# Patient Record
Sex: Male | Born: 1935 | Race: White | Hispanic: No | Smoking: Never smoker
Health system: Southern US, Community
[De-identification: ages and names within clinical notes are randomized; demographics above are authoritative.]

## PROBLEM LIST (undated history)

## (undated) DIAGNOSIS — R569 Unspecified convulsions: Secondary | ICD-10-CM

## (undated) DIAGNOSIS — N1831 Chronic kidney disease, stage 3a: Secondary | ICD-10-CM

## (undated) DIAGNOSIS — N209 Urinary calculus, unspecified: Secondary | ICD-10-CM

## (undated) DIAGNOSIS — M6530 Trigger finger, unspecified finger: Secondary | ICD-10-CM

## (undated) DIAGNOSIS — F0391 Unspecified dementia with behavioral disturbance: Secondary | ICD-10-CM

## (undated) DIAGNOSIS — E119 Type 2 diabetes mellitus without complications: Secondary | ICD-10-CM

## (undated) DIAGNOSIS — N529 Male erectile dysfunction, unspecified: Secondary | ICD-10-CM

## (undated) DIAGNOSIS — D6869 Other thrombophilia: Secondary | ICD-10-CM

## (undated) DIAGNOSIS — R600 Localized edema: Secondary | ICD-10-CM

## (undated) DIAGNOSIS — G5600 Carpal tunnel syndrome, unspecified upper limb: Secondary | ICD-10-CM

## (undated) DIAGNOSIS — N189 Chronic kidney disease, unspecified: Secondary | ICD-10-CM

## (undated) DIAGNOSIS — G319 Degenerative disease of nervous system, unspecified: Secondary | ICD-10-CM

## (undated) DIAGNOSIS — Z87828 Personal history of other (healed) physical injury and trauma: Secondary | ICD-10-CM

## (undated) DIAGNOSIS — I482 Chronic atrial fibrillation, unspecified: Secondary | ICD-10-CM

## (undated) DIAGNOSIS — E785 Hyperlipidemia, unspecified: Secondary | ICD-10-CM

## (undated) DIAGNOSIS — J309 Allergic rhinitis, unspecified: Secondary | ICD-10-CM

## (undated) DIAGNOSIS — F03918 Unspecified dementia, unspecified severity, with other behavioral disturbance: Secondary | ICD-10-CM

## (undated) DIAGNOSIS — E7800 Pure hypercholesterolemia, unspecified: Secondary | ICD-10-CM

## (undated) DIAGNOSIS — I10 Essential (primary) hypertension: Secondary | ICD-10-CM

## (undated) HISTORY — DX: Personal history of other (healed) physical injury and trauma: Z87.828

## (undated) HISTORY — DX: Unspecified dementia, unspecified severity, with behavioral disturbance: F03.91

## (undated) HISTORY — DX: Male erectile dysfunction, unspecified: N52.9

## (undated) HISTORY — DX: Hyperlipidemia, unspecified: E78.5

## (undated) HISTORY — DX: Chronic kidney disease, stage 3a: N18.31

## (undated) HISTORY — DX: Unspecified convulsions: R56.9

## (undated) HISTORY — DX: Other thrombophilia: D68.69

## (undated) HISTORY — DX: Degenerative disease of nervous system, unspecified: G31.9

## (undated) HISTORY — DX: Trigger finger, unspecified finger: M65.30

## (undated) HISTORY — PX: CRANIOTOMY: SHX93

## (undated) HISTORY — DX: Type 2 diabetes mellitus without complications: E11.9

## (undated) HISTORY — DX: Chronic kidney disease, unspecified: N18.9

## (undated) HISTORY — DX: Chronic atrial fibrillation, unspecified: I48.20

## (undated) HISTORY — DX: Urinary calculus, unspecified: N20.9

## (undated) HISTORY — DX: Allergic rhinitis, unspecified: J30.9

## (undated) HISTORY — PX: EXTRACORPOREAL SHOCK WAVE LITHOTRIPSY: SHX1557

## (undated) HISTORY — DX: Unspecified dementia, unspecified severity, with other behavioral disturbance: F03.918

## (undated) HISTORY — DX: Localized edema: R60.0

## (undated) HISTORY — DX: Carpal tunnel syndrome, unspecified upper limb: G56.00

## (undated) HISTORY — PX: KNEE SURGERY: SHX244

---

## 2015-04-24 ENCOUNTER — Inpatient Hospital Stay
Admission: EM | Admit: 2015-04-24 | Discharge: 2015-04-26 | DRG: 087 | Disposition: A | Discharge status: Home / Self Care | Payer: Medicare Other | Attending: Surgical Critical Care | Admitting: Surgical Critical Care

## 2015-04-24 ENCOUNTER — Emergency Department: Payer: Medicare Other

## 2015-04-24 ENCOUNTER — Inpatient Hospital Stay: Payer: Medicare Other

## 2015-04-24 ENCOUNTER — Inpatient Hospital Stay: Payer: PRIVATE HEALTH INSURANCE | Admitting: Surgical Critical Care

## 2015-04-24 DIAGNOSIS — I609 Nontraumatic subarachnoid hemorrhage, unspecified: Secondary | ICD-10-CM | POA: Diagnosis present

## 2015-04-24 DIAGNOSIS — R402251 Coma scale, best verbal response, oriented, in the field [EMT or ambulance]: Secondary | ICD-10-CM | POA: Diagnosis present

## 2015-04-24 DIAGNOSIS — S069X1A Unspecified intracranial injury with loss of consciousness of 30 minutes or less, initial encounter: Secondary | ICD-10-CM

## 2015-04-24 DIAGNOSIS — Z7982 Long term (current) use of aspirin: Secondary | ICD-10-CM

## 2015-04-24 DIAGNOSIS — Y92254 Theater (live) as the place of occurrence of the external cause: Secondary | ICD-10-CM

## 2015-04-24 DIAGNOSIS — R561 Post traumatic seizures: Secondary | ICD-10-CM

## 2015-04-24 DIAGNOSIS — E7800 Pure hypercholesterolemia, unspecified: Secondary | ICD-10-CM | POA: Diagnosis present

## 2015-04-24 DIAGNOSIS — R402141 Coma scale, eyes open, spontaneous, in the field [EMT or ambulance]: Secondary | ICD-10-CM | POA: Diagnosis present

## 2015-04-24 DIAGNOSIS — R55 Syncope and collapse: Secondary | ICD-10-CM

## 2015-04-24 DIAGNOSIS — S065X1A Traumatic subdural hemorrhage with loss of consciousness of 30 minutes or less, initial encounter: Secondary | ICD-10-CM

## 2015-04-24 DIAGNOSIS — I6200 Nontraumatic subdural hemorrhage, unspecified: Secondary | ICD-10-CM

## 2015-04-24 DIAGNOSIS — S066X1A Traumatic subarachnoid hemorrhage with loss of consciousness of 30 minutes or less, initial encounter: Principal | ICD-10-CM

## 2015-04-24 DIAGNOSIS — W19XXXA Unspecified fall, initial encounter: Secondary | ICD-10-CM

## 2015-04-24 DIAGNOSIS — R402361 Coma scale, best motor response, obeys commands, in the field [EMT or ambulance]: Secondary | ICD-10-CM | POA: Diagnosis present

## 2015-04-24 DIAGNOSIS — S065XAA Traumatic subdural hemorrhage with loss of consciousness status unknown, initial encounter: Secondary | ICD-10-CM | POA: Diagnosis present

## 2015-04-24 DIAGNOSIS — I10 Essential (primary) hypertension: Secondary | ICD-10-CM | POA: Diagnosis present

## 2015-04-24 DIAGNOSIS — I4510 Unspecified right bundle-branch block: Secondary | ICD-10-CM

## 2015-04-24 DIAGNOSIS — S0990XA Unspecified injury of head, initial encounter: Secondary | ICD-10-CM

## 2015-04-24 DIAGNOSIS — S0219XA Other fracture of base of skull, initial encounter for closed fracture: Secondary | ICD-10-CM

## 2015-04-24 HISTORY — DX: Essential (primary) hypertension: I10

## 2015-04-24 HISTORY — DX: Pure hypercholesterolemia, unspecified: E78.00

## 2015-04-24 LAB — URINALYSIS, REFLEX TO MICROSCOPIC EXAM IF INDICATED
Bilirubin, UA: NEGATIVE
Blood, UA: NEGATIVE
Glucose, UA: NEGATIVE
Ketones UA: NEGATIVE
Leukocyte Esterase, UA: NEGATIVE
Nitrite, UA: NEGATIVE
Protein, UR: NEGATIVE
Specific Gravity UA: 1.015 (ref 1.001–1.035)
Urine pH: 7 (ref 5.0–8.0)
Urobilinogen, UA: NORMAL mg/dL

## 2015-04-24 LAB — COMPREHENSIVE METABOLIC PANEL
ALT: 31 U/L (ref 0–55)
AST (SGOT): 30 U/L (ref 5–34)
Albumin/Globulin Ratio: 1.2 (ref 0.9–2.2)
Albumin: 3.7 g/dL (ref 3.5–5.0)
Alkaline Phosphatase: 53 U/L (ref 38–106)
BUN: 16 mg/dL (ref 9.0–28.0)
Bilirubin, Total: 0.6 mg/dL (ref 0.2–1.2)
CO2: 25 mEq/L (ref 22–29)
Calcium: 8.8 mg/dL (ref 7.9–10.2)
Chloride: 104 mEq/L (ref 100–111)
Creatinine: 1 mg/dL (ref 0.7–1.3)
Globulin: 3 g/dL (ref 2.0–3.6)
Glucose: 157 mg/dL — ABNORMAL HIGH (ref 70–100)
Potassium: 3.9 mEq/L (ref 3.5–5.1)
Protein, Total: 6.7 g/dL (ref 6.0–8.3)
Sodium: 140 mEq/L (ref 136–145)

## 2015-04-24 LAB — CBC AND DIFFERENTIAL
Basophils Absolute Automated: 0.02 10*3/uL (ref 0.00–0.20)
Basophils Automated: 0 %
Eosinophils Absolute Automated: 0.03 10*3/uL (ref 0.00–0.70)
Eosinophils Automated: 0 %
Hematocrit: 41.4 % — ABNORMAL LOW (ref 42.0–52.0)
Hgb: 13.9 g/dL (ref 13.0–17.0)
Immature Granulocytes Absolute: 0.05 10*3/uL
Immature Granulocytes: 0 %
Lymphocytes Absolute Automated: 1.49 10*3/uL (ref 0.50–4.40)
Lymphocytes Automated: 12 %
MCH: 31 pg (ref 28.0–32.0)
MCHC: 33.6 g/dL (ref 32.0–36.0)
MCV: 92.2 fL (ref 80.0–100.0)
MPV: 10.8 fL (ref 9.4–12.3)
Monocytes Absolute Automated: 0.54 10*3/uL (ref 0.00–1.20)
Monocytes: 4 %
Neutrophils Absolute: 10.61 10*3/uL — ABNORMAL HIGH (ref 1.80–8.10)
Neutrophils: 83 %
Nucleated RBC: 0 /100 WBC (ref 0–1)
Platelets: 230 10*3/uL (ref 140–400)
RBC: 4.49 10*6/uL — ABNORMAL LOW (ref 4.70–6.00)
RDW: 13 % (ref 12–15)
WBC: 12.74 10*3/uL — ABNORMAL HIGH (ref 3.50–10.80)

## 2015-04-24 LAB — ECG 12-LEAD
Atrial Rate: 77 {beats}/min
P Axis: 30 degrees
P-R Interval: 182 ms
Q-T Interval: 436 ms
QRS Duration: 128 ms
QTC Calculation (Bezet): 493 ms
R Axis: -14 degrees
T Axis: -8 degrees
Ventricular Rate: 77 {beats}/min

## 2015-04-24 LAB — PLATELET FUNCTION TEST
Collagen ADP Closure Time: 93 (ref 46–105)
Collagen EPI Closure Time: 119 (ref 70–148)

## 2015-04-24 LAB — GFR: EGFR: >60

## 2015-04-24 LAB — I-STAT TROPONIN: i-STAT Troponin: 0.01 ng/mL (ref 0.00–0.09)

## 2015-04-24 MED ORDER — SODIUM CHLORIDE 0.9 % IV BOLUS
1000.0000 mL | Freq: Once | INTRAVENOUS | Status: AC
Start: 2015-04-24 — End: 2015-04-24
  Administered 2015-04-24: 1000 mL via INTRAVENOUS

## 2015-04-24 MED ORDER — SODIUM PHOSPHATE 3 MMOLE/ML IV SOLN
15.0000 mmol | INTRAVENOUS | Status: DC | PRN
Start: 2015-04-24 — End: 2015-04-26

## 2015-04-24 MED ORDER — SODIUM CHLORIDE 0.9 % IV SOLN
INTRAVENOUS | Status: DC
Start: 2015-04-24 — End: 2015-04-25

## 2015-04-24 MED ORDER — LABETALOL HCL 5 MG/ML IV SOLN
10.0000 mg | INTRAVENOUS | Status: DC | PRN
Start: 2015-04-24 — End: 2015-04-26

## 2015-04-24 MED ORDER — MAGNESIUM SULFATE IN D5W 10-5 MG/ML-% IV SOLN
1.0000 g | INTRAVENOUS | Status: DC | PRN
Start: 2015-04-24 — End: 2015-04-26

## 2015-04-24 MED ORDER — CETIRIZINE HCL 10 MG PO TABS
10.0000 mg | ORAL_TABLET | Freq: Every day | ORAL | Status: DC
Start: 2015-04-24 — End: 2015-04-26
  Filled 2015-04-24 (×4): qty 1

## 2015-04-24 MED ORDER — FOSPHENYTOIN SODIUM 100 MG PE/2ML IJ SOLN
100.0000 mg | Freq: Three times a day (TID) | INTRAMUSCULAR | Status: DC
Start: 2015-04-25 — End: 2015-04-26
  Administered 2015-04-25 – 2015-04-26 (×2): 100 mg via INTRAVENOUS
  Filled 2015-04-24 (×2): qty 2

## 2015-04-24 MED ORDER — AMLODIPINE BESYLATE 5 MG PO TABS
5.0000 mg | ORAL_TABLET | Freq: Every day | ORAL | Status: DC
Start: 2015-04-24 — End: 2015-04-26
  Administered 2015-04-24 – 2015-04-26 (×3): 5 mg via ORAL
  Filled 2015-04-24 (×3): qty 1

## 2015-04-24 MED ORDER — ONDANSETRON HCL 4 MG/2ML IJ SOLN
4.0000 mg | Freq: Three times a day (TID) | INTRAMUSCULAR | Status: DC | PRN
Start: 2015-04-24 — End: 2015-04-26

## 2015-04-24 MED ORDER — LOSARTAN POTASSIUM 25 MG PO TABS
50.0000 mg | ORAL_TABLET | Freq: Every day | ORAL | Status: DC
Start: 2015-04-24 — End: 2015-04-26
  Administered 2015-04-24 – 2015-04-26 (×3): 50 mg via ORAL
  Filled 2015-04-24 (×3): qty 2

## 2015-04-24 MED ORDER — LACTATED RINGERS IV SOLN
INTRAVENOUS | Status: DC
Start: 2015-04-24 — End: 2015-04-24

## 2015-04-24 MED ORDER — SODIUM CHLORIDE 0.9 % IV SOLN
1.0000 g | INTRAVENOUS | Status: DC | PRN
Start: 2015-04-24 — End: 2015-04-26

## 2015-04-24 MED ORDER — SODIUM PHOSPHATE 3 MMOLE/ML IV SOLN
35.0000 mmol | INTRAVENOUS | Status: DC | PRN
Start: 2015-04-24 — End: 2015-04-26

## 2015-04-24 MED ORDER — POTASSIUM CHLORIDE 10 MEQ/100ML IV SOLN
10.0000 meq | INTRAVENOUS | Status: DC | PRN
Start: 2015-04-24 — End: 2015-04-26

## 2015-04-24 MED ORDER — ATORVASTATIN CALCIUM 20 MG PO TABS
40.0000 mg | ORAL_TABLET | Freq: Every day | ORAL | Status: DC
Start: 2015-04-24 — End: 2015-04-26
  Administered 2015-04-24 – 2015-04-26 (×3): 40 mg via ORAL
  Filled 2015-04-24 (×3): qty 2

## 2015-04-24 MED ORDER — SODIUM CHLORIDE 0.9 % IV SOLN
18.0000 mg/kg | Freq: Once | INTRAVENOUS | Status: AC
Start: 2015-04-24 — End: 2015-04-24
  Administered 2015-04-24: 1700 mg via INTRAVENOUS
  Filled 2015-04-24 (×2): qty 34

## 2015-04-24 MED ORDER — SODIUM PHOSPHATE 3 MMOLE/ML IV SOLN
25.0000 mmol | INTRAVENOUS | Status: DC | PRN
Start: 2015-04-24 — End: 2015-04-26

## 2015-04-24 NOTE — Progress Notes (Signed)
Received Carlos [name] from ED. Patient is a 81 y.o. male on ASA 81mg  po qd last taken 11/21 pm who presents to the hospital on 04/24/2015 with presents s/p fall unclear head injury or LOC. CT- L temporal contusions, acute R SAH temporal, bilateral SDH R>L, small EDH, R temporal fracture. Patient placed in Aspen collar and taken to CT on monitor for Cervical CT. Patient returned to bedside. TICU resident to bedside to update patient and wife.

## 2015-04-24 NOTE — ED Provider Notes (Addendum)
Physician/Midlevel provider first contact with patient: 04/24/15 1322         EMERGENCY DEPARTMENT HISTORY AND PHYSICAL EXAM    Date Time: 04/24/2015 1:22 PM   Patient Name: Carlos [name]  Attending MD: Rachell Cipro  Time of Initial Evaluation:: 1:22 PM       Diagnosis and Treatment Plan     Disposition:  ED Disposition     Admit Admitting Physician: NG, Nehemiah Massed [[id number]]  Diagnosis: Traumatic subarachnoid bleed with LOC of 30 minutes or less [741187]  Estimated Length of Stay: > or = to 2 midnights  Tentative Discharge Plan?: Home or Self Care [1]  Patient Class: Inpatient [101]  I certify that inpatient services are medically necessary for this patient. Please see H&P and MD progress notes for additional information about the patient'[name] course of treatment. For Medicare patients, services provided in accordance with 412.3 and expected LOS to be greater than 2 midnights for Medicare patients.: Yes             Diagnosis:  1. Traumatic subarachnoid bleed with LOC of 30 minutes or less, initial encounter    2. Syncope, unspecified syncope type    3. Closed head injury, initial encounter    4. Subdural hemorrhage         New Discharge Prescriptions:  New Prescriptions    No medications on file        History of Presenting Illness     Chief Complaint:   Chief Complaint   Patient presents with   . Syncope   . Dizziness       Carlos [name] is a 81 y.o. male BIBA who presents with mild frontal headache and head injury [name]/p syncope pta. Per wife pt was walking/running on a cement bridge connecting a movie theater and the parking lot when he decided to slowly trot across the bridge as a joke. Wife states "3/4 way down the bridge he suddenly crumpled" and had a syncopal episode, falling and hitting his head. Wife states pt was not "with it" and looked rigid with spontaneous LUE shaking and pt was "out of it" for 3-4 minutes afterwards. Wife called for help and 2 men came and assisted her in getting pt up and calling 911.  Pt states he has minimal frontal headache behind his eyes. No other recent fall or head injury. Pt and wife drove up yesterday from [place], [place] and pt states his calves do not feel painful but feel like they are cramping.     This history was obtained from the(a) patient, patient'[name] wife.     Past Medical History     Past Medical History   Diagnosis Date   . Hypertension    . High cholesterol        Past Surgical History     Past Surgical History   Procedure Laterality Date   . Knee surgery         Family History     History reviewed. No pertinent family history.    Social History     Social History     Social History   . Marital Status: Single     Spouse Name: N/A   . Number of Children: N/A   . Years of Education: N/A     Social History Main Topics   . Smoking status: Never Smoker    . Smokeless tobacco: Not on file   . Alcohol Use: Not on file   . Drug Use:  Not on file   . Sexual Activity: Not on file     Other Topics Concern   . Not on file     Social History Narrative   . No narrative on file       Allergies     No Known Allergies    Medications     No current facility-administered medications for this encounter.    Current outpatient prescriptions:   .  amLODIPine (NORVASC) 10 MG tablet, Take 10 mg by mouth daily., Disp: , Rfl:   .  aspirin EC 81 MG EC tablet, Take 81 mg by mouth daily., Disp: , Rfl:   .  atorvastatin (LIPITOR) 40 MG tablet, Take 40 mg by mouth daily., Disp: , Rfl:   .  loratadine (CLARITIN) 10 MG tablet, Take 10 mg by mouth daily., Disp: , Rfl:   .  losartan (COZAAR) 100 MG tablet, Take 100 mg by mouth daily., Disp: , Rfl:     Review of Systems     Positive: headache behind eyes. No numbness or weakness    All Other Systems Reviewed and Negative: Yes    Physical Exam     BP 153/65 mmHg  Pulse 82  Temp(Src) 96.9 F (36.1 C) (Oral)  Resp 14  Ht 6\' 1"  (1.854 m)  Wt 95.255 kg  BMI 27.71 kg/m2  SpO2 98%    Constitutional: Vital signs reviewed. Well appearing and NAD  Head:  Normocephalic, atraumatic. (no apparent abrasion or STS of the scalp)  Eyes: No conjunctival injection. No discharge. EOMI, PERRL  ENT: Mucous membranes moist. TMs negative for hemotympanum.   Neck: Normal range of motion. Trachea midline. No midline tenderness, FROM.   Respiratory/Chest: Clear to auscultation. No respiratory distress.   Cardiovascular: Regular rate and rhythm. No gallops, rubs, or murmur. No JVD or bruits.  Abdomen: Soft and non-tender. No guarding. No masses or hepatosplenomegaly.  UpperExtremity:from, no c/c/e  LowerExtremity:  Neurological: No focal motor deficits by observation. Speech normal. Memory normal. Alert, oriented x3. 5/5 strength left upper and bilateral lower extremities, 4/5 strength L upper extremity d/t ongoing carpal tunnel, sensation intact, motor strength intact, finger to nose intact  Skin: Warm and dry. No rash.  Psychiatric: Normal affect. Normal concentration.    Diagnostic Study Results     Labs -     Results     Procedure Component Value Units Date/Time    UA, Reflex to Microscopic (pts  3 + yrs) [[id number]] Collected:  04/24/15 1459    Specimen Information:  Urine Updated:  04/24/15 1533     Urine Type Clean Catch      Color, UA Yellow      Clarity, UA Clear      Specific Gravity UA 1.015      Urine pH 7.0      Leukocyte Esterase, UA Negative      Nitrite, UA Negative      Protein, UR Negative      Glucose, UA Negative      Ketones UA Negative      Urobilinogen, UA Normal mg/dL      Bilirubin, UA Negative      Blood, UA Negative     GFR [[id number]] Collected:  04/24/15 1459     EGFR >60.0 Updated:  04/24/15 1533    Comprehensive metabolic panel [[id number]]  (Abnormal) Collected:  04/24/15 1459    Specimen Information:  Blood Updated:  04/24/15 1533     Glucose 157 (H) mg/dL  BUN 16.0 mg/dL      Creatinine 1.0 mg/dL      Sodium 161 mEq/L      Potassium 3.9 mEq/L      Chloride 104 mEq/L      CO2 25 mEq/L      Calcium 8.8 mg/dL      Protein, Total 6.7 g/dL      Albumin  3.7 g/dL      AST (SGOT) 30 U/L      ALT 31 U/L      Alkaline Phosphatase 53 U/L      Bilirubin, Total 0.6 mg/dL      Globulin 3.0 g/dL      Albumin/Globulin Ratio 1.2     CBC with differential [[id number]]  (Abnormal) Collected:  04/24/15 1459    Specimen Information:  Blood from Blood Updated:  04/24/15 1519     WBC 12.74 (H) x10 3/uL      Hgb 13.9 g/dL      Hematocrit 81.1 (L) %      Platelets 230 x10 3/uL      RBC 4.49 (L) x10 6/uL      MCV 92.2 fL      MCH 31.0 pg      MCHC 33.6 g/dL      RDW 13 %      MPV 10.8 fL      Neutrophils 83 %      Lymphocytes Automated 12 %      Monocytes 4 %      Eosinophils Automated 0 %      Basophils Automated 0 %      Immature Granulocyte 0 %      Nucleated RBC 0 /100 WBC      Neutrophils Absolute 10.61 (H) x10 3/uL      Abs Lymph Automated 1.49 x10 3/uL      Abs Mono Automated 0.54 x10 3/uL      Abs Eos Automated 0.03 x10 3/uL      Absolute Baso Automated 0.02 x10 3/uL      Absolute Immature Granulocyte 0.05 x10 3/uL     i-Stat Troponin [[id number]] Collected:  04/24/15 1456     i-STAT Troponin 0.01 ng/mL Updated:  04/24/15 1512          Radiologic Studies -   Radiology Results (24 Hour)     Procedure Component Value Units Date/Time    XR Chest AP Portable [[id number]] Resulted:  04/24/15 1557    Order Status:  Sent Updated:  04/24/15 1600    CT Head WO Contrast [[id number]] Collected:  04/24/15 1458    Order Status:  Completed Updated:  04/24/15 1514    Narrative:      HISTORY: Syncope with head trauma. Posttraumatic headaches.         COMPARISON: None available.    TECHNIQUE: A non- contrast CT of the head was performed.      This CT study was performed using radiation dose reduction techniques  including  the following: automated exposure control or adjustment of  the mA and/or kV according to patient size and the use of iterative  reconstruction technique.    FINDINGS: There is a fracture of the squamous portion of the right  temporal bone. There is pneumocephalus lateral to the  left temporal lobe  suggesting there is an occult left temporal bone fracture as well.     There are areas of increased attenuation felt to represent hemorrhagic  contusions in the anterior and lateral  aspects of the left temporal  lobe.    There is acute subarachnoid hemorrhage in several the sulci of the right  temporal lobe.    There are bilateral extra-axial hyperattenuating collections that are  most likely bilateral acute subdural hematomas, right greater than left.  Small amounts of acute epidural hemorrhage related to the bilateral  temporal bone fractures could also be present. The right-sided subdural  hematoma measures up to 6 mm in thickness and is associated with the 3  mm of right to left midline shift.    There is no evidence of hydrocephalus.    The paranasal sinuses show polypoid soft tissue filling much of the left  maxillary sinus that could reflect a mucous retention cyst, antral  polyp, inverted papilloma or other polypoid pathology of uncertain  histology. There appears to been previous bilateral sinus surgery. There  is extensive lobulated soft tissue throughout the remaining ethmoid air  cells bilaterally. Lobular soft tissue opacifies the left frontal recess  and the left frontal sinus.           Impression:        1. There is a fracture of the squamous portion of the right temporal  bone. There is pneumocephalus lateral to the left temporal lobe  suggesting there is an occult left temporal bone fracture as well.     2. There are hemorrhagic contusions in the anterior and lateral aspects  of the left temporal lobe.    3. There is acute subarachnoid hemorrhage in several the sulci of the  right temporal lobe.    4. There are bilateral extra-axial hyperattenuating collections that are  most likely bilateral acute subdural hematomas, right greater than left.  Small amounts of acute epidural hemorrhage related to the bilateral  temporal bone fractures could also be present. The right-sided  subdural  hematoma measures up to 6 mm in thickness and is associated with the 3  mm of right to left midline shift.    5. There has been previous bilateral sinus surgery. Extensive sinonasal  polyposis as discussed above. In particular within the left maxillary  sinus  there is polypoid soft tissue filling much of the left maxillary  sinus that could reflect a mucous retention cyst, antral polyp, inverted  papilloma or other polypoid pathology of uncertain histology.     6. These critical  results were discussed with Dr. Cheral Almas   by telephone  at 2:56 PM on April 24, 2015.     Theodoro Doing, MD   04/24/2015 3:10 PM        .    Clinical Course in the Emergency Department       3:00 pm - discussed CT with Dr. Derry Skill who notes likely traumatic SAH, small bilateral SDH. Page placed to neurosurg   3:05 pm - discussed with Carly senior trauma resident.   3:11 pm - discussed with neurosurgery   1520: discussed the case and CT results with Mr. Bothwell, who awaits eval. Per Trauma Team and NSURG.  He only has mild HA at this time and no neuro complaints.  Further questioning and his wife acknowledges that it appeared that he "did hit his head pretty hard when he went to the ground"  Rachell Cipro, MD    Critical Care Time (not including procedures): 30-74 minutes.   Due to the high risk of critical illness or multi-organ failure at initial presentation and/or during ED course.    System([name]) at risk for compromise:  CNS  Critical Diagnosis:   1. Traumatic subarachnoid bleed with LOC of 30 minutes or less, initial encounter    2. Syncope, unspecified syncope type    3. Closed head injury, initial encounter    4. Subdural hemorrhage    5. SAH (subarachnoid hemorrhage)    6. SDH (subdural hematoma)    7. Closed fracture of temporal bone, initial encounter         The patient was Hypotensive:   No     The patient was Hypoxic:   No     This does not including time spent performing other reported procedures or  services.   Critical care time involved full attention to the patient'[name] condition and included:   Review of nursing notes and/or old charts - Yes  Documentation time - Yes  Care, transfer of care, and discharge plans - Yes  Obtaining necessary history from family, EMS, nursing home staff and/or treating physicians - Yes  Review of medications, allergies, and vital signs - Yes   Consultant collaboration on findings and treatment options - Yes  Ordering, interpreting, and reviewing diagnostic studies/tab tests - Yes         Medical Decision Making     I reviewed the vital signs, nursing notes, past medical history, past surgical history, family history and social history.    Vital Signs -   Patient Vitals for the past 12 hrs:   BP Temp Pulse Resp   04/24/15 1458 153/65 mmHg - 82 14   04/24/15 1400 - - 72 18   04/24/15 1256 166/72 mmHg - 82 18   04/24/15 1202 155/72 mmHg - 84 18   04/24/15 1126 160/77 mmHg (!) 96.9 F (36.1 C) 86 17       Pulse Oximetry Analysis - nl without need for supplemental oxygen    EKG as interpreted by me: NSR 77, borderline prolonged QRS with RBBB pattern. T-wave inversion inferior leads, otherwise negative EKG.    Laboratory results reviewed by EDP: yes in real time      Impression/ Differential Diagnosis (not completely inclusive): 81 y.o. WM with Syncopal event and fall to ground r/o CVA, traumatic ICH, syncope d/t arrythmia, etc  Plan: Telemonitoring, CT head, labs and analgesia.  Trauma admission to TICU for close monitoring    _______________________________    Attestations:    I was acting as a scribe for Rachell Cipro, MD on Carlos [name]   Treatment Team: Scribe: Al-Amin, Yousif .    I am the first provider for this patient and I personally performed the services documented. Treatment Team: Scribe: Al-Amin, Yousif  is scribing for me on Bucher,Carlos [name] . This note accurately reflects work and decisions made by me, Rachell Cipro, M.D.           _______________________________      Vara Guardian, MD  04/29/15 1478    Vara Guardian, MD  04/29/15 [id number]

## 2015-04-24 NOTE — H&P (Addendum)
Attending Attestation:     I have personally seen and examined this patient and I have reviewed Carlos notes, assessments, and/ or procedures by Carlos resident staff and physician extenders, I concur with his/her documentation of Carlos Carlos [name].    I have personally edited Carlos note below as needed    Main Issues:  72M visiting family in [name] from [name] who fell on concrete bridge outside of [name], questionable immediate Sz at scene  GCS 15 currently in ER  +CT multicompartment ICH with B temporal fractures  On ASA 81, pending PFA and INR- treat as needed  ICU admission for q1hr, discussed with pt and significant other who he says has legal POA (also 2 sons)- pt is Full Code  AEDs for multi-compartment/ temporal bleed  Hold Lovenox  Therapy  Questionable syncope, telemetry, RBBB prob chronic- will check with PMD office    Inpatient Admission is needed due to: multi-compartment ICH and Sz requiring prolonged monitoring in ICU    Exclusive of time for Resident/ Student education and for separate procedures-  Total Critical Care Time spent no minutes.    Carlos Kitchens, MD, FACS    Carl[place] [name], M.D., F.A.C.S. Carlos Carlos [name] [phone])  Attending Surgeon, [facility]  General and Acute Care Surgery, Trauma and Surgical Critical Care  Board Certified in Surgery and in Surgical Critical Care  and Subspecialty Certified in Neurocritical Care                 TRAUMA HISTORY AND PHYSICAL     Date Time: 04/24/2015 3:31 PM  Patient Name: Carlos Carlos [name]  Attending Physician: Carlos Harm, MD  Primary Care Physician: Carlos See, MD    Date of Admission:   04/24/2015 11:56 AM    Trauma Level:   Consult    Assessment/Plan:   Carlos patient has Carlos following active problems:  Patient Active Problem List   Diagnosis   . SDH (subdural hematoma)   . SAH (subarachnoid hemorrhage)   . Temporal bone fracture       Plan by systems:  Neuro: q1h neuro checks, NSGY following, AEDs, pain control, ENT  consult in AM  Pulm: pulm toilet  CV: will contact PCP for old EKG to compare RBBB, holding home ASA, half home HTN meds  Endo: NAI  GI: NPO overnight, IVF  Heme/ID: f/u PFAs  Renal: monitor UOP  Psych: supportive  Wounds: none    Patient will be admitted to: ICU  Massive transfusion protocol:  No      Consulting Services:   Neurosurgery - Carlos Carlos [name]    Patient Complaint:   Carlos Carlos [name] is a 81 y.o. male who presents to Carlos hospital after Fall: Yes.  Carlos patient had a fall earlier today while walking quickly over a bridge. His girlfriend reports that he did not trip or stagger, but suddenly fell down, hitting his Carlos [name] and had approx 2 minutes of LOC. He was initially confused when he woke up, but has since normalized. He has no memory of Carlos event. He has had prior episodes of dizziness while playing tennis, but he relates that to his blood pressure meds, which he and his PCP have been adjusting accordingly. He states that he is concerned about this event because he has always been aware of Carlos dizziness in time to prevent falls. He has a known "electrical pathway abnormality" from prior EKGs (RBBB on in-house EKG), but this has been asymptomatic. He takes  ASA 81, but no anticoagulants. He reports headache and decreased hearing.     PCP ([place]): [place] Carlos [name] [phone]  POA (significant other): Carlos Carlos [name] - patient reports she has POA paperwork and he wants her to make decisions, not legally married.    Scene Report:      Scene GCS: Eye opening 4 - spontaneous, Verbal Response 5 - alert/oriented, Motor Response 6 - obeys commands. Total GCS: 15   Transport: ALS, Time of Injury PTA   Transferred from: From Scene   LOC: Yes   Intubated: No   Hemodynamically: Stable   C-spine immobilized pre-hospital: No          Carlos medications, past medical/surgical history, family history, allergies & full review of systems were:  Reviewed    Allergies:   No Known Allergies    Medication:     (Not in a hospital  admission)    Past Medical History:     Past Medical History   Diagnosis Date   . Hypertension    . High cholesterol        Past Surgical History:     Past Surgical History   Procedure Laterality Date   . Knee surgery         Family History:   History reviewed. No pertinent family history.    Social History:     Social History     Social History   . Marital Status: Single     Spouse Name: N/A   . Number of Children: N/A   . Years of Education: N/A     Social History Main Topics   . Smoking status: Never Smoker    . Smokeless tobacco: Not on file   . Alcohol Use: Not on file   . Drug Use: Not on file   . Sexual Activity: Not on file     Other Topics Concern   . Not on file     Social History Narrative   . No narrative on file       Vaccination:   Tetanus up to date: Unknown    Review of Systems:   Review of Systems   Constitutional: Negative.    HENT: Positive for hearing loss.    Eyes: Negative.    Respiratory: Negative.    Cardiovascular: Negative.    Gastrointestinal: Negative.    Musculoskeletal: Negative.    Skin: Negative.    Neurological: Positive for sensory change and headaches.   Endo/Heme/Allergies: Negative.    Psychiatric/Behavioral: Negative.        Physical Exam:   Physical Exam   Constitutional: He is oriented to person, place, and time and well-developed, well-nourished, and in no distress.   HENT:   Carlos [name]: Normocephalic and atraumatic.   Eyes: EOM are normal. Pupils are equal, round, and reactive to light. No scleral icterus.   Neck: Normal range of motion. No tracheal deviation present.   C/T/L-spine non-tender, no step-offs   Cardiovascular: Normal rate, regular rhythm and intact distal pulses.    Pulmonary/Chest: Effort normal and breath sounds normal. No respiratory distress.   Abdominal: Soft. He exhibits no distension. There is no tenderness.   Musculoskeletal: Normal range of motion. He exhibits no edema.   Pelvis stable, non-tender   Neurological: He is alert and oriented to person, place,  and time. GCS score is 15.   Skin: Skin is warm and dry. He is not diaphoretic.   Psychiatric: Affect and judgment normal.   Vitals reviewed.  Filed Vitals:    04/24/15 1458   BP: 153/65   Pulse: 82   Temp:    Resp: 14   SpO2: 98%         Labs:     Results     Procedure Component Value Units Date/Time    CBC with differential [[id number]]  (Abnormal) Collected:  04/24/15 1459    Specimen Information:  Blood from Blood Updated:  04/24/15 1519     WBC 12.74 (H) x10 3/uL      Hgb 13.9 g/dL      Hematocrit 40.9 (L) %      Platelets 230 x10 3/uL      RBC 4.49 (L) x10 6/uL      MCV 92.2 fL      MCH 31.0 pg      MCHC 33.6 g/dL      RDW 13 %      MPV 10.8 fL      Neutrophils 83 %      Lymphocytes Automated 12 %      Monocytes 4 %      Eosinophils Automated 0 %      Basophils Automated 0 %      Immature Granulocyte 0 %      Nucleated RBC 0 /100 WBC      Neutrophils Absolute 10.61 (H) x10 3/uL      Abs Lymph Automated 1.49 x10 3/uL      Abs Mono Automated 0.54 x10 3/uL      Abs Eos Automated 0.03 x10 3/uL      Absolute Baso Automated 0.02 x10 3/uL      Absolute Immature Granulocyte 0.05 x10 3/uL     Comprehensive metabolic panel [[id number]] Collected:  04/24/15 1459    Specimen Information:  Blood Updated:  04/24/15 1513    UA, Reflex to Microscopic (pts  3 + yrs) [[id number]] Collected:  04/24/15 1459    Specimen Information:  Urine Updated:  04/24/15 1513    i-Stat Troponin [[id number]] Collected:  04/24/15 1456     i-STAT Troponin 0.01 Kie Calvin/mL Updated:  04/24/15 1512          Rads:   Radiological Procedure reviewed.      CT Carlos [name] WO CONTRAST    Ct Carlos [name] Wo Contrast    04/24/2015  1. There is a fracture of Carlos squamous portion of Carlos right temporal bone. There is pneumocephalus lateral to Carlos left temporal lobe suggesting there is an occult left temporal bone fracture as well. 2. There are hemorrhagic contusions in Carlos anterior and lateral aspects of Carlos left temporal lobe. 3. There is acute subarachnoid hemorrhage in several  Carlos sulci of Carlos right temporal lobe. 4. There are bilateral extra-axial hyperattenuating collections that are most likely bilateral acute subdural hematomas, right greater than left. Small amounts of acute epidural hemorrhage related to Carlos bilateral temporal bone fractures could also be present. Carlos right-sided subdural hematoma measures up to 6 mm in thickness and is associated with Carlos 3 mm of right to left midline shift. 5. There has been previous bilateral sinus surgery. Extensive sinonasal polyposis as discussed above. In particular within Carlos left maxillary sinus  there is polypoid soft tissue filling much of Carlos left maxillary sinus that could reflect a mucous retention cyst, antral polyp, inverted papilloma or other polypoid pathology of uncertain histology. 6. These critical  results were discussed with Dr. Cheral Almas   by telephone at 2:56 PM on April 24, 2015. Theodoro Doing,  MD 04/24/2015 3:10 PM       Carly A. Allred, MD  General Surgery  Pager [phone]      Attending Attestation:

## 2015-04-24 NOTE — Progress Notes (Signed)
Neurosurgery Progress Note    NS Examined Date Time: 04/24/2015 3:22 PM  Signed by: Lyanne Co  Time consult called: 3:25 PM  Date/Time: 04/24/2015 4:17 PM

## 2015-04-24 NOTE — Plan of Care (Signed)
Problem: Safety  Goal: Patient will be free from injury during hospitalization  Outcome: Progressing    Problem: Pain  Goal: Patient's pain/discomfort is manageable  Outcome: Progressing    Problem: Psychosocial and Spiritual Needs  Goal: Demonstrates ability to cope with hospitalization/illness  Outcome: Progressing    Problem: Moderate/High Fall Risk Score >5  Goal: Patient will remain free of falls  Outcome: Progressing    Problem: Potential for Compromised Skin Integrity  Goal: Skin integrity is maintained or improved  Assess and monitor skin integrity. Identify patients at risk for skin breakdown on admission and per policy. Collaborate with interdisciplinary team and initiate plans and interventions as needed.   Outcome: Progressing  Goal: Nutritional status is improving  Monitor and assess patient for malnutrition (ex- brittle hair, bruises, dry skin, pale skin and conjunctiva, muscle wasting, smooth red tongue, and disorientation). Collaborate with interdisciplinary team and initiate plan and interventions as ordered. Monitor patient's weight and dietary intake as ordered or per policy. Utilize nutrition screening tool and intervene per policy. Determine patient's food preferences and provide high-protein, high-caloric foods as appropriate.   Outcome: Progressing    Problem: Tissue integrity  Goal: Damaged tissue is healing and protected  Outcome: Progressing  Goal: Nutritional Status Improving  Outcome: Progressing    Problem: Urinary Incontinence  Goal: Perineal skin integrity is maintained or improved  Assess genitourinary system, perineal skin, labs (urinalysis), and history of incontinence to include past management, aggravating, and alleviating factors. Collaborate with interdisciplinary team and initiate plans and interventions as needed.   Outcome: Progressing

## 2015-04-24 NOTE — Consults (Signed)
NEUROSURGERY CONSULTATION    Date Time: 04/24/2015 4:17 PM  Patient Name: Carlos [name]  Requesting Physician: Vara Guardian, MD  Consulting Physician: Dr. Rosemarie Beath  Covered By: Neurosurgery PA/pager [phone]    Time of Exam: 3:25 PM     Reason for Consultation:   L temporal contusions, acute R SAH temporal, bilateral SDH R>L, small EDH, R temporal fracture    History:   Carlos [name] is a 81 y.o. male on ASA 81mg  po qd who presents to the hospital on 04/24/2015 with presents s/p fall unclear head injury or LOC. Patient reports he was walking to his car to grab his coat and then doesn't remember what happened next but was laying on the ground. Per wife, patient "crumped" down, did not clearly hit his head, and was unresponsive to her for 2-3 minutes although his eyes were open. Patient endorses mild frontal/retro-orbital headache. Patient denies vision change, dizziness, nausea, vomiting, weakness, numbness, paresthesias. Pt has pmhx of HTN and hypercholesterolemia. Of note, patient reports that he gets "dizzy spells" occasionally when playing tennis but says he did not feel at all dizzy today. Per patient, he has been on the same anti-HTN medications with same dose for "a long time".     Past Medical History:     Past Medical History   Diagnosis Date   . Hypertension    . High cholesterol      Past Surgical History:     Past Surgical History   Procedure Laterality Date   . Knee surgery         Family History:   History reviewed. No pertinent family history.    Social History:     Social History     Social History   . Marital Status: Single     Spouse Name: N/A   . Number of Children: N/A   . Years of Education: N/A     Social History Main Topics   . Smoking status: Never Smoker    . Smokeless tobacco: Not on file   . Alcohol Use: Not on file   . Drug Use: Not on file   . Sexual Activity: Not on file     Other Topics Concern   . Not on file     Social History Narrative   . No narrative on file        Allergies:   No Known Allergies    Medications:   See chart    Review of Systems:   A comprehensive review of systems was: see HPI. All other systems were reviewed and are negative    Physical Exam:     Filed Vitals:    04/24/15 1458   BP: 153/65   Pulse: 82   Temp:    Resp: 14   SpO2: 98%       Intake and Output Summary (Last 24 hours) at Date Time  No intake or output data in the 24 hours ending 04/24/15 1617    Physical Exam    General: Patient is well developed and well nourished and in no apparent distress   Cooperative with the examination.     Blood pressure stable, pulse regular, afebrile, and respiratory rate regular.     Mental Status/Psych:   Awake, alert, and oriented x 3 (self, location, month/year)  Recent and remote memory are intact  Attention span and concentration normal  No agitation, delirium, or evidence of delusions or hallucinations  Affect appropriate for mood.  Neuro:  Follows commands  Normocephalic atraumatic; without scalp abrasions, lacerations  No bony step-off or battle's sign  PERRL , EOMI BL. No nystagmus.   No evidence of visual field neglect  Face is symmetric, tongue midline  Speech clear/fluent and appropriate  No evidence of aphasia  (-) drift    Motor:   BUE 5/5 strength: deltoid, bicep, tricep, grip, interosseous   BLE 5/5 strength: IP, quadriceps, DF, EHL, PF  Muscle tone is normal. No atrophy.    Sensory: intact to light touch in bilateral upper and lower extremities    Reflexes:   Bicep and patellar DTR's 2+ and symmetric  (-) babinski bilaterally  (-) clonus bilaterally    GCS:15       Lungs CTA +BBS  CVS RRR no mgr  ABD NDNT    Labs Reviewed:     Lab Results   Component Value Date    WBC 12.74* 04/24/2015    HGB 13.9 04/24/2015    HCT 41.4* 04/24/2015    MCV 92.2 04/24/2015    PLT 230 04/24/2015     Lab Results   Component Value Date    NA 140 04/24/2015    K 3.9 04/24/2015    CL 104 04/24/2015    CO2 25 04/24/2015     No results found for: INR, PT    Rads:      Radiology Results (24 Hour)     Procedure Component Value Units Date/Time    XR Chest AP Portable [[id number]] Resulted:  04/24/15 1557    Order Status:  Sent Updated:  04/24/15 1600    CT Head WO Contrast [[id number]] Collected:  04/24/15 1458    Order Status:  Completed Updated:  04/24/15 1514    Narrative:      HISTORY: Syncope with head trauma. Posttraumatic headaches.         COMPARISON: None available.    TECHNIQUE: A non- contrast CT of the head was performed.      This CT study was performed using radiation dose reduction techniques  including  the following: automated exposure control or adjustment of  the mA and/or kV according to patient size and the use of iterative  reconstruction technique.    FINDINGS: There is a fracture of the squamous portion of the right  temporal bone. There is pneumocephalus lateral to the left temporal lobe  suggesting there is an occult left temporal bone fracture as well.     There are areas of increased attenuation felt to represent hemorrhagic  contusions in the anterior and lateral aspects of the left temporal  lobe.    There is acute subarachnoid hemorrhage in several the sulci of the right  temporal lobe.    There are bilateral extra-axial hyperattenuating collections that are  most likely bilateral acute subdural hematomas, right greater than left.  Small amounts of acute epidural hemorrhage related to the bilateral  temporal bone fractures could also be present. The right-sided subdural  hematoma measures up to 6 mm in thickness and is associated with the 3  mm of right to left midline shift.    There is no evidence of hydrocephalus.    The paranasal sinuses show polypoid soft tissue filling much of the left  maxillary sinus that could reflect a mucous retention cyst, antral  polyp, inverted papilloma or other polypoid pathology of uncertain  histology. There appears to been previous bilateral sinus surgery. There  is extensive lobulated soft tissue throughout the  remaining ethmoid  air  cells bilaterally. Lobular soft tissue opacifies the left frontal recess  and the left frontal sinus.           Impression:        1. There is a fracture of the squamous portion of the right temporal  bone. There is pneumocephalus lateral to the left temporal lobe  suggesting there is an occult left temporal bone fracture as well.     2. There are hemorrhagic contusions in the anterior and lateral aspects  of the left temporal lobe.    3. There is acute subarachnoid hemorrhage in several the sulci of the  right temporal lobe.    4. There are bilateral extra-axial hyperattenuating collections that are  most likely bilateral acute subdural hematomas, right greater than left.  Small amounts of acute epidural hemorrhage related to the bilateral  temporal bone fractures could also be present. The right-sided subdural  hematoma measures up to 6 mm in thickness and is associated with the 3  mm of right to left midline shift.    5. There has been previous bilateral sinus surgery. Extensive sinonasal  polyposis as discussed above. In particular within the left maxillary  sinus  there is polypoid soft tissue filling much of the left maxillary  sinus that could reflect a mucous retention cyst, antral polyp, inverted  papilloma or other polypoid pathology of uncertain histology.     6. These critical  results were discussed with Dr. Cheral Almas   by telephone  at 2:56 PM on April 24, 2015.     Theodoro Doing, MD   04/24/2015 3:10 PM            Assessment:   81 yo M on ASA 81mg  with pmhx HTN, hypercholesterolemia presents s/p fall with unknown head injury/LOC. Head CT revealed L temporal contusions, acute R SAH temporal, bilateral SDH R>L, small EDH, R temporal fracture, and temporal fracture. GCS 15, neuro intact.    Plan:   Admit to TICU  Medical management per [facility]  q1 neuro checks   Repeat head CT for neuro decline  SBP < 150  HOB > 30  No ASA/NSAIDs/blood thinners  Keppra   No acute neurosurgical  intervention indicated at this time    Plan discussed with Dr. Deloria Lair    Signed by: Lyanne Co PA-C  Date/Time: 04/24/2015 4:17 PM       I have reviewed the notes and examined the patient with and performed by PA Desko, I concur with her/his documentation of Carlos [name].    Dr. Marlaine Hind        Neuro:  Follows commands  Normocephalic atraumatic; without scalp abrasions, lacerations  No bony step-off or battle's sign  PERRL , EOMI BL. No nystagmus.   No evidence of visual field neglect  Face is symmetric, tongue midline  Speech clear/fluent and appropriate  No evidence of aphasia  (-) drift    Motor:   BUE 5/5 strength: deltoid, bicep, tricep, grip, interosseous   BLE 5/5 strength: IP, quadriceps, DF, EHL, PF  Muscle tone is normal. No atrophy.    Sensory: intact to light touch in bilateral upper and lower extremities    Reflexes:   Bicep and patellar DTR's 2+ and symmetric  (-) babinski bilaterally  (-) clonus bilaterally    GCS:15    A/SDH /EDH with temporal fracture (non displaced); temporal contusion    P/ 1. Neurochecks q 1 hrs 2. keppra 3. Rpt CTH for exam  decline    JFH

## 2015-04-25 ENCOUNTER — Inpatient Hospital Stay: Payer: Medicare Other

## 2015-04-25 DIAGNOSIS — I6200 Nontraumatic subdural hemorrhage, unspecified: Secondary | ICD-10-CM

## 2015-04-25 DIAGNOSIS — I609 Nontraumatic subarachnoid hemorrhage, unspecified: Secondary | ICD-10-CM

## 2015-04-25 DIAGNOSIS — S066X1A Traumatic subarachnoid hemorrhage with loss of consciousness of 30 minutes or less, initial encounter: Secondary | ICD-10-CM | POA: Insufficient documentation

## 2015-04-25 DIAGNOSIS — S06310A Contusion and laceration of right cerebrum without loss of consciousness, initial encounter: Secondary | ICD-10-CM

## 2015-04-25 NOTE — UM Notes (Addendum)
Admit to inpatient:  04/24/15 1617    Presents to the hospital on 04/24/2015 with presents s/p fall unclear head injury or LOC. Patient reports he was walking to his car to grab his coat and then doesn't remember what happened next but was laying on the ground. Per wife, patient "crumped" down, did not clearly hit his head, and was unresponsive to her for 2-3 minutes although his eyes were open. Patient endorses mild frontal/retro-orbital headache.    98.5, 93, 43, 156/84    CT- L temporal contusions, acute R SAH temporal, bilateral SDH R>L, small EDH, R temporal fracture.There is pneumocephalus lateral to the left temporal lobe suggesting there is an occult left temporal bone fracture R temporal fracture associated with the 3mm of right to left midline shift.    PMH: "electrical pathway abnormality" from prior EKGs (RBBB on in-house EKG), but this has been asymptomatic. He takes ASA 81, but no anticoagulants. He reports headache and decreased hearing.    ED Meds: NS Bolus x 1L    Admitted ICU  unit: VS q 1  hr, Telemetry and pulse oximetry: Cerebyx 1,700 mg iv x 1, Cerebyx 100 mg iv q 8 hr,  IVF @ 125cc/hr

## 2015-04-25 NOTE — Progress Notes (Signed)
ICU ACUTE CARE SURGERY / TRAUMA PROGRESS NOTE     Date/Time: 04/25/2015 6:36 AM  Patient Name: Carlos [name]  Primary Care Physician: Christa See, MD  Hospital Day: 1     Post-op Day:      Assessment/Plan:   This patient is critically ill and requiring ICU level of care due to Cerebral contusions.    The patient has the following active problems:  Patient Active Problem List   Diagnosis   . SDH (subdural hematoma)   . SAH (subarachnoid hemorrhage)   . Temporal bone fracture   . Traumatic subarachnoid bleed with LOC of 30 minutes or less       Plan by systems:  Neuro: GCS 15, mae x 4 , SAH , L temporal contusions, bilat sdh, small EDH, R temporal bone fx, non op per neurosurgery , cont q1 neurochecks , if needed start fioricet for pain control  Seizure Prophylaxis Dilantin   Pulm: RA, encourage IS deep breathing and cough, oob  CV: SR, BP stable, hx of htn , cont norvasc, lipitor, cozaar  Endo: nai  GI: NPO - start diet this am , d/c mivf   GI Prophylaxis:  No prophylaxis need, patient is on a diet   Heme/ID: afebrile, HD stable  DVT Prophylaxis: SCDs and frequent ambulation  Renal: voiding per urinal, cont to monitor   Foley: no  Neuromuscular: oob with assistance  Weight Bearing Right Left   Upper Extremity WBAT WBAT   Lower Extremity WBAT WBAT   PT/OT: no  Psych: supportive   Wounds: none  Dispo: eval when ambulatory for pt/ot need     No other injuries identified during my exam other than the ones noted     Neurosurgery - [place]    Interval History:   Carlos [name] is a 81 y.o. male who presents to the hospital after Fall: Yes.     Significant overnight events include none     Allergies:   No Known Allergies    Medications:     Current Facility-Administered Medications   Medication Dose Route Frequency   . amLODIPine  5 mg Oral Daily   . atorvastatin  40 mg Oral Daily   . cetirizine  10 mg Oral Daily   . fosphenytoin MAINTENANCE DOSE  100 mg PE Intravenous Q8H   . losartan  50 mg Oral Daily             calcium GLUConate IVPB 1g, labetalol, magnesium sulfate, ondansetron, potassium chloride, sodium phosphate IVPB, sodium phosphate IVPB, sodium phosphate IVPB        Labs:     Results     Procedure Component Value Units Date/Time    MRSA culture [[id number]] Collected:  04/24/15 1853    Specimen Information:  Body Fluid from Nares and Throat Updated:  04/24/15 2124    Platelet Function Test - [facility] ONLY [[id number]] Collected:  04/24/15 1624    Specimen Information:  Blood Updated:  04/24/15 1804     Collagen ADP Closure Time 93      Collagen EPI Closure Time 119     UA, Reflex to Microscopic (pts  3 + yrs) [[id number]] Collected:  04/24/15 1459    Specimen Information:  Urine Updated:  04/24/15 1533     Urine Type Clean Catch      Color, UA Yellow      Clarity, UA Clear      Specific Gravity UA 1.015      Urine pH 7.0  Leukocyte Esterase, UA Negative      Nitrite, UA Negative      Protein, UR Negative      Glucose, UA Negative      Ketones UA Negative      Urobilinogen, UA Normal mg/dL      Bilirubin, UA Negative      Blood, UA Negative     GFR [[id number]] Collected:  04/24/15 1459     EGFR >60.0 Updated:  04/24/15 1533    Comprehensive metabolic panel [[id number]]  (Abnormal) Collected:  04/24/15 1459    Specimen Information:  Blood Updated:  04/24/15 1533     Glucose 157 (H) mg/dL      BUN 18.8 mg/dL      Creatinine 1.0 mg/dL      Sodium 416 mEq/L      Potassium 3.9 mEq/L      Chloride 104 mEq/L      CO2 25 mEq/L      Calcium 8.8 mg/dL      Protein, Total 6.7 g/dL      Albumin 3.7 g/dL      AST (SGOT) 30 U/L      ALT 31 U/L      Alkaline Phosphatase 53 U/L      Bilirubin, Total 0.6 mg/dL      Globulin 3.0 g/dL      Albumin/Globulin Ratio 1.2     CBC with differential [[id number]]  (Abnormal) Collected:  04/24/15 1459    Specimen Information:  Blood from Blood Updated:  04/24/15 1519     WBC 12.74 (H) x10 3/uL      Hgb 13.9 g/dL      Hematocrit 09.3 (L) %      Platelets 230 x10 3/uL      RBC 4.49 (L) x10  6/uL      MCV 92.2 fL      MCH 31.0 pg      MCHC 33.6 g/dL      RDW 13 %      MPV 10.8 fL      Neutrophils 83 %      Lymphocytes Automated 12 %      Monocytes 4 %      Eosinophils Automated 0 %      Basophils Automated 0 %      Immature Granulocyte 0 %      Nucleated RBC 0 /100 WBC      Neutrophils Absolute 10.61 (H) x10 3/uL      Abs Lymph Automated 1.49 x10 3/uL      Abs Mono Automated 0.54 x10 3/uL      Abs Eos Automated 0.03 x10 3/uL      Absolute Baso Automated 0.02 x10 3/uL      Absolute Immature Granulocyte 0.05 x10 3/uL     i-Stat Troponin [[id number]] Collected:  04/24/15 1456     i-STAT Troponin 0.01 ng/mL Updated:  04/24/15 1512          Rads:   Radiological Procedure reviewed.    Radiology Results (24 Hour)     Procedure Component Value Units Date/Time    CT Cervical Spine WO Contrast [[id number]] Collected:  04/24/15 1845    Order Status:  Completed Updated:  04/24/15 1850    Narrative:      HISTORY: Pain after trauma.    TECHNIQUE: Noncontrast CT of the cervical spine with sagittal and  coronal reformatted images. The following dose reduction techniques were  utilized: automatic exposure control and/or adjustment of  mA and/or kV  according to patient size, and the use of iterative reconstruction  technique.    FINDINGS: There is a normal cervical lordosis. The vertebral body  heights are maintained. Alignment of the cervical spine is normal  without subluxation. There is multilevel degenerative disc disease with  loss of disc height and endplate osteophytic spurring at the C3-C4  through C5-C6 levels. Multilevel uncovertebral and facet joint  hypertrophy is seen. There is no evidence of acute fracture in the  cervical spine. No significant prevertebral soft tissue abnormality is  seen.      Impression:       No acute cervical spinal fracture identified.    Georgann Housekeeper, MD   04/24/2015 6:46 PM      XR Chest AP Portable [[id number]] Collected:  04/24/15 1644    Order Status:  Completed Updated:  04/24/15  1648    Narrative:      CLINICAL HISTORY: Trauma    COMPARISON: None    AP view of the chest was performed.    FINDINGS:        The lungs are clear.  The heart size and bones are within normal limits.      Impression:        No acute cardiopulmonary disease.    Annamary Carolin, MD   04/24/2015 4:44 PM      CT Head WO Contrast [[id number]] Collected:  04/24/15 1458    Order Status:  Completed Updated:  04/24/15 1514    Narrative:      HISTORY: Syncope with head trauma. Posttraumatic headaches.         COMPARISON: None available.    TECHNIQUE: A non- contrast CT of the head was performed.      This CT study was performed using radiation dose reduction techniques  including  the following: automated exposure control or adjustment of  the mA and/or kV according to patient size and the use of iterative  reconstruction technique.    FINDINGS: There is a fracture of the squamous portion of the right  temporal bone. There is pneumocephalus lateral to the left temporal lobe  suggesting there is an occult left temporal bone fracture as well.     There are areas of increased attenuation felt to represent hemorrhagic  contusions in the anterior and lateral aspects of the left temporal  lobe.    There is acute subarachnoid hemorrhage in several the sulci of the right  temporal lobe.    There are bilateral extra-axial hyperattenuating collections that are  most likely bilateral acute subdural hematomas, right greater than left.  Small amounts of acute epidural hemorrhage related to the bilateral  temporal bone fractures could also be present. The right-sided subdural  hematoma measures up to 6 mm in thickness and is associated with the 3  mm of right to left midline shift.    There is no evidence of hydrocephalus.    The paranasal sinuses show polypoid soft tissue filling much of the left  maxillary sinus that could reflect a mucous retention cyst, antral  polyp, inverted papilloma or other polypoid pathology of  uncertain  histology. There appears to been previous bilateral sinus surgery. There  is extensive lobulated soft tissue throughout the remaining ethmoid air  cells bilaterally. Lobular soft tissue opacifies the left frontal recess  and the left frontal sinus.           Impression:        1. There is a  fracture of the squamous portion of the right temporal  bone. There is pneumocephalus lateral to the left temporal lobe  suggesting there is an occult left temporal bone fracture as well.     2. There are hemorrhagic contusions in the anterior and lateral aspects  of the left temporal lobe.    3. There is acute subarachnoid hemorrhage in several the sulci of the  right temporal lobe.    4. There are bilateral extra-axial hyperattenuating collections that are  most likely bilateral acute subdural hematomas, right greater than left.  Small amounts of acute epidural hemorrhage related to the bilateral  temporal bone fractures could also be present. The right-sided subdural  hematoma measures up to 6 mm in thickness and is associated with the 3  mm of right to left midline shift.    5. There has been previous bilateral sinus surgery. Extensive sinonasal  polyposis as discussed above. In particular within the left maxillary  sinus  there is polypoid soft tissue filling much of the left maxillary  sinus that could reflect a mucous retention cyst, antral polyp, inverted  papilloma or other polypoid pathology of uncertain histology.     6. These critical  results were discussed with Dr. Cheral Almas   by telephone  at 2:56 PM on April 24, 2015.     Theodoro Doing, MD   04/24/2015 3:10 PM            Physical Exam:   Temp:  [96.9 F (36.1 C)-98.5 F (36.9 C)] 98 F (36.7 C)  Heart Rate:  [63-100] 63  Resp Rate:  [8-43] 9  BP: (114-166)/(55-84) 134/63 mmHg      Invasive ICU Hemodynamics:                 Vital Signs:  Filed Vitals:    04/25/15 0600   BP: 134/63   Pulse: 63   Temp:    Resp: 9   SpO2: 98%       Vent Settings:              I/O:  Intake and Output Summary (Last 24 hours) at Date Time  I/O last 3 completed shifts:  In: 1000 [IV Piggyback:1000]  Out: -     Output:         Nutrition:   Orders Placed This Encounter   Procedures   . Diet NPO effective now       Physical Exam:  Physical Exam   Constitutional: He is oriented to person, place, and time. He appears well-developed and well-nourished. No distress.   HENT:   Head: Normocephalic and atraumatic.   Eyes: Pupils are equal, round, and reactive to light.   Neck:   Slight R neck muscular pain - aspen removed    Cardiovascular: Normal rate, regular rhythm, normal heart sounds and intact distal pulses.    No murmur heard.  Pulmonary/Chest: Effort normal and breath sounds normal. No respiratory distress. He has no wheezes. He has no rales. He exhibits no tenderness.   CTAB     RA    Abdominal: Soft. He exhibits no distension. There is no tenderness. There is no rebound and no guarding.   NPO      Genitourinary:   Voiding per urinal    Musculoskeletal: Normal range of motion. He exhibits no edema or tenderness.   Neurological: He is alert and oriented to person, place, and time.   Slight HA, no dizziness or n/v  Skin: Skin is warm.   Nursing note and vitals reviewed.      Ruben A. Carlynn Purl, NP  [phone]    Attending Attestation:   I have seen the patient, duplicated the exam and reviewed the flow sheet, labs and imaging studies and edited the note.  I agree with the assessment and plan.    Baseline neuro exam, no focal neuro deficit, GCS 15, will start PO intake. Denies neck pain, no posterior cervical tenderness, full ROM of neck without pain, CT scan of the c-spine shows no acute traumatic findings. Collar was removed, c-spine is clear. Star getting out of bed. Continue to monitor in the TICU for possible blossoming of cerebral contusions.    Particia Lather, MD, [facility]  Acute Care Surgeon  (General/Trauma/Critical Care)  [phone]

## 2015-04-25 NOTE — SLP Eval Note (Signed)
[facility]   Speech and Language Therapy Evaluation     Patient: Carlos [name]    MRN#: [id number]     Consult received for Carlos [name] for SLP Evaluation and Treatment.    Assessment:     Clinical Impression: Carlos [name] is a 81 y.o. male admitted 04/24/2015 for Subdural hemorrhage [I62.00]  Closed head injury, initial encounter [S09.90XA]  Traumatic subarachnoid bleed with LOC of 30 minutes or less, initial encounter [S06.6X1A]  Syncope, unspecified syncope type [R55] presenting with functional speech, language, and cognitive-linguistic skills with no evidence of acute disorder. SLP intervention is not warranted at this time.   .     Therapy Diagnosis: r/o cognitive-linguistic deficits and aphasia  Treatment Activities: eval     Discharge Recommendations:   Expected disposition: Recommendations: No follow up required     Plan:   Plan:   No SLP f/u warranted at this time. Re-consult if decline noted.        Current Hospitalization:  Referring Physician: NG, Carlos [name]  Date of Referral: 04/25/2015    Medical Diagnosis: Subdural hemorrhage [I62.00]  Closed head injury, initial encounter [S09.90XA]  Traumatic subarachnoid bleed with LOC of 30 minutes or less, initial encounter [S06.6X1A]  Syncope, unspecified syncope type [R55]    History of Present Illness: GIANG HEMME is a 81 y.o. male admitted on 04/24/2015 with fall unclear head injury or LOC. Patient reports he was walking to his car to grab his coat and then doesn't remember what happened next but was laying on the ground. Per wife, patient "crumped" down, did not clearly hit his head, and was unresponsive to her for 2-3 minutes although his eyes were open. Patient endorses mild frontal/retro-orbital headache. Patient denies vision change, dizziness, nausea, vomiting, weakness, numbness, paresthesias. Pt has pmhx of HTN and hypercholesterolemia. Of note, patient reports that he gets "dizzy spells" occasionally when playing tennis but says he  did not feel at all dizzy today. Per patient, he has been on the same anti-HTN medications with same dose for "a long time".     Patient Active Problem List   Diagnosis   . SDH (subdural hematoma)   . SAH (subarachnoid hemorrhage)   . Temporal bone fracture   . Traumatic subarachnoid bleed with LOC of 30 minutes or less   . Traumatic subarachnoid bleed with LOC of 30 minutes or less, initial encounter       Past Medical/Surgical History:  Past Medical History   Diagnosis Date   . Hypertension    . High cholesterol       Social History:    retired; lives in [place] with spouse    Subjective:   Patient is agreeable to participation in the therapy session. Nursing clears patient for therapy. Patient's medical condition is appropriate for Speech therapy intervention at this time. A&Ox4    PAIN:  denied  Objective:   Observation of Patient/Vital Signs:  Patient is in bed with peripheral IV in place.    Patient left with call bell within reach, all needs met, SCDs in place, fall mat in place, bed alarm  activated and all questions answered. RN notified of session outcome and patient response.     Cognitive Status and Neuro Exam:   Immediate, recent and working memory all 100%. Problem solving, abstract reasoning, organization, and auditory processing all 100% accurate (HOH requires elevated volume).     Oral Motor Assessment:   WNL    Auditory Comprehension:  Follows 4 step commands; complex Y/N was 100% accurate.     Reading Comprehension:   DNT    Expression:   Oral     Verbal Expression:   Conformational Naming 100%  Generative naming was mildly slow with semantic paraphasia noted x1 which was readily self-corrected. Despite some delay, pt was 100% accurate. Pt and spouse report this is B/L.     Written Expression:   DNT     Pragmatics:   WNL    Behavior:   WNL    Educated the patient to role of speech therapy, plan of care, goals of therapy and pt understood.     Goals: Eval only - re-consult if decline noted.     Time  of treatment:   SLP Received On: 04/25/15  Start Time: 1520  Stop Time: 1555  Time Calculation (min): 35 min    Merton Border, M.A., CCC-SLP [phone]

## 2015-04-25 NOTE — Progress Notes (Signed)
NEUROSURGERY ICU PROGRESS NOTE    Date Time: 04/25/2015 2:48 PM  Patient Name: Carlos [name]  Attending Physician:Dr. Rosemarie Beath  Covered By: Neurosurgery PA/pager [phone]    Interim History:     NAE overnight    Medications:     Current Facility-Administered Medications   Medication Dose Route Frequency   . amLODIPine  5 mg Oral Daily   . atorvastatin  40 mg Oral Daily   . cetirizine  10 mg Oral Daily   . fosphenytoin MAINTENANCE DOSE  100 mg PE Intravenous Q8H   . losartan  50 mg Oral Daily       Physical Exam:     Filed Vitals:    04/25/15 1300   BP: 120/61   Pulse: 70   Temp:    Resp:    SpO2: 98%         Intake/Output Summary (Last 24 hours) at 04/25/15 1448  Last data filed at 04/25/15 1200   Gross per 24 hour   Intake   2475 ml   Output   3800 ml   Net  -1325 ml     Neuro exam:  NAD sitting upright in bed. Family at side  Awake. Alert x oriented to self, hosptial, Nov 2016  Facial symmetry  Tongue midline  Speech clear  PERRL 3mm; conjugate gaze  Motor:   BUE and BLE grossly 5/5  SILT BUE and BLE  No pronator drift    Labs:     Lab Results   Component Value Date    WBC 12.74* 04/24/2015    HGB 13.9 04/24/2015    HCT 41.4* 04/24/2015    MCV 92.2 04/24/2015    PLT 230 04/24/2015     Lab Results   Component Value Date    NA 140 04/24/2015    K 3.9 04/24/2015    CL 104 04/24/2015    CO2 25 04/24/2015     No results found for: INR, PT  Lab Results   Component Value Date    BUN 16.0 04/24/2015     Lab Results   Component Value Date    CREAT 1.0 04/24/2015       Rads:     Radiology Results (24 Hour)     Procedure Component Value Units Date/Time    CT Cervical Spine WO Contrast [[id number]] Collected:  04/24/15 1845    Order Status:  Completed Updated:  04/24/15 1850    Narrative:      HISTORY: Pain after trauma.    TECHNIQUE: Noncontrast CT of the cervical spine with sagittal and  coronal reformatted images. The following dose reduction techniques were  utilized: automatic exposure control and/or adjustment of mA  and/or kV  according to patient size, and the use of iterative reconstruction  technique.    FINDINGS: There is a normal cervical lordosis. The vertebral body  heights are maintained. Alignment of the cervical spine is normal  without subluxation. There is multilevel degenerative disc disease with  loss of disc height and endplate osteophytic spurring at the C3-C4  through C5-C6 levels. Multilevel uncovertebral and facet joint  hypertrophy is seen. There is no evidence of acute fracture in the  cervical spine. No significant prevertebral soft tissue abnormality is  seen.      Impression:       No acute cervical spinal fracture identified.    Georgann Housekeeper, MD   04/24/2015 6:46 PM      XR Chest AP Portable [[id number]] Collected:  04/24/15 1644    Order Status:  Completed Updated:  04/24/15 1648    Narrative:      CLINICAL HISTORY: Trauma    COMPARISON: None    AP view of the chest was performed.    FINDINGS:        The lungs are clear.  The heart size and bones are within normal limits.      Impression:        No acute cardiopulmonary disease.    Annamary Carolin, MD   04/24/2015 4:44 PM      CT Head WO Contrast [[id number]] Collected:  04/24/15 1458    Order Status:  Completed Updated:  04/24/15 1514    Narrative:      HISTORY: Syncope with head trauma. Posttraumatic headaches.         COMPARISON: None available.    TECHNIQUE: A non- contrast CT of the head was performed.      This CT study was performed using radiation dose reduction techniques  including  the following: automated exposure control or adjustment of  the mA and/or kV according to patient size and the use of iterative  reconstruction technique.    FINDINGS: There is a fracture of the squamous portion of the right  temporal bone. There is pneumocephalus lateral to the left temporal lobe  suggesting there is an occult left temporal bone fracture as well.     There are areas of increased attenuation felt to represent hemorrhagic  contusions in the anterior  and lateral aspects of the left temporal  lobe.    There is acute subarachnoid hemorrhage in several the sulci of the right  temporal lobe.    There are bilateral extra-axial hyperattenuating collections that are  most likely bilateral acute subdural hematomas, right greater than left.  Small amounts of acute epidural hemorrhage related to the bilateral  temporal bone fractures could also be present. The right-sided subdural  hematoma measures up to 6 mm in thickness and is associated with the 3  mm of right to left midline shift.    There is no evidence of hydrocephalus.    The paranasal sinuses show polypoid soft tissue filling much of the left  maxillary sinus that could reflect a mucous retention cyst, antral  polyp, inverted papilloma or other polypoid pathology of uncertain  histology. There appears to been previous bilateral sinus surgery. There  is extensive lobulated soft tissue throughout the remaining ethmoid air  cells bilaterally. Lobular soft tissue opacifies the left frontal recess  and the left frontal sinus.           Impression:        1. There is a fracture of the squamous portion of the right temporal  bone. There is pneumocephalus lateral to the left temporal lobe  suggesting there is an occult left temporal bone fracture as well.     2. There are hemorrhagic contusions in the anterior and lateral aspects  of the left temporal lobe.    3. There is acute subarachnoid hemorrhage in several the sulci of the  right temporal lobe.    4. There are bilateral extra-axial hyperattenuating collections that are  most likely bilateral acute subdural hematomas, right greater than left.  Small amounts of acute epidural hemorrhage related to the bilateral  temporal bone fractures could also be present. The right-sided subdural  hematoma measures up to 6 mm in thickness and is associated with the 3  mm of right to left midline  shift.    5. There has been previous bilateral sinus surgery. Extensive  sinonasal  polyposis as discussed above. In particular within the left maxillary  sinus  there is polypoid soft tissue filling much of the left maxillary  sinus that could reflect a mucous retention cyst, antral polyp, inverted  papilloma or other polypoid pathology of uncertain histology.     6. These critical  results were discussed with Dr. Cheral Almas   by telephone  at 2:56 PM on April 24, 2015.     Theodoro Doing, MD   04/24/2015 3:10 PM            Assessment:     81 yo M on ASA 81mg  with pmhx HTN, hypercholesterolemia HD#2 (11/22) s/p fall with unknown head injury/LOC. Head CT revealed L temporal contusions, acute R SAH temporal, bilateral SDH R>L, small EDH, and R temporal fracture. GCS 15, neuro intact.    Plan:   Continue medical management per primary team   Okay for floor from neurosurgery standpoint  Continue to hold ASA/NSAIDs/Anticoagulants/antiplatelets  AEDs x7days    Signed by: Aldean Baker PA-C  Date/Time: 04/25/2015 2:48 PM          I have reviewed the notes and examined the patient with and performed by PA Tanski, I concur with her/his documentation of Carlos [name].    Dr. Marlaine Hind    NAD sitting upright in bed. Family at side  Awake. Alert x oriented to self, hosptial, Nov 2016  Facial symmetry  Tongue midline  Speech clear  PERRL 3mm; conjugate gaze  Motor:   BUE and BLE grossly 5/5  SILT BUE and BLE  No pronator drift    A/ SDH/EDH/ Skull fracture  P/ 1. Neuro exam q2 hrs 2. AEDS x 7 days    [place]

## 2015-04-26 ENCOUNTER — Other Ambulatory Visit: Payer: Self-pay

## 2015-04-26 DIAGNOSIS — I6200 Nontraumatic subdural hemorrhage, unspecified: Secondary | ICD-10-CM | POA: Insufficient documentation

## 2015-04-26 DIAGNOSIS — S0990XA Unspecified injury of head, initial encounter: Secondary | ICD-10-CM

## 2015-04-26 MED ORDER — LEVETIRACETAM 500 MG PO TABS
500.0000 mg | ORAL_TABLET | Freq: Two times a day (BID) | ORAL | 0 refills | Status: DC
Start: 2015-04-26 — End: 2015-04-26
  Filled 2015-04-26: qty 10, 5d supply, fill #0

## 2015-04-26 MED ORDER — LEVETIRACETAM 500 MG PO TABS
500.0000 mg | ORAL_TABLET | Freq: Two times a day (BID) | ORAL | Status: DC
Start: 2015-04-26 — End: 2015-04-26
  Administered 2015-04-26: 500 mg via ORAL
  Filled 2015-04-26: qty 1

## 2015-04-26 MED ORDER — LEVETIRACETAM 500 MG PO TABS
500.0000 mg | ORAL_TABLET | Freq: Two times a day (BID) | ORAL | 0 refills | Status: AC
Start: 2015-04-26 — End: 2015-05-01
  Filled 2015-04-26 (×2): qty 10, 5d supply, fill #0

## 2015-04-26 NOTE — Plan of Care (Signed)
Neuro intact. Denies pain, VSS. Eating, voiding, ambulating halls. Discharge paperwork reviewed with pt and wife. CD request faxed. He will be resting at his wife's children's house in [place] for the week before driving back home to [place].

## 2015-04-26 NOTE — Final Progress Note (DC Note for stay less than 48 (Signed)
Discharge Summary    Date Time: 04/26/2015 9:02 AM  Patient Name: Carlos [name]  Attending Physician: Abagail Kitchens, MD  Service: Trauma Critical Care    Date of Admission:   04/24/2015    Date of Discharge:   04/26/15    Reason for Admission:   Subdural hemorrhage [I62.00]  Closed head injury, initial encounter [S09.90XA]  Traumatic subarachnoid bleed with LOC of 30 minutes or less, initial encounter [S06.6X1A]  Syncope, unspecified syncope type [R55]    Problems:   Lists the present on admission hospital problems  Present on Admission:   . SDH (subdural hematoma)  . SAH (subarachnoid hemorrhage)  . Temporal bone fracture  . Traumatic subarachnoid bleed with LOC of 30 minutes or less    Hospital Problems:  Active Problems:    SDH (subdural hematoma)    SAH (subarachnoid hemorrhage)    Temporal bone fracture    Traumatic subarachnoid bleed with LOC of 30 minutes or less    Traumatic subarachnoid bleed with LOC of 30 minutes or less, initial encounter    Closed head injury, initial encounter    Subdural hemorrhage      Discharge Dx:     1. Traumatic subarachnoid bleed with LOC of 30 minutes or less, initial encounter    2. Syncope, unspecified syncope type    3. Closed head injury, initial encounter    4. Subdural hemorrhage    5. SAH (subarachnoid hemorrhage)    6. SDH (subdural hematoma)    7. Closed fracture of temporal bone, initial encounter        Consultations:   Neurosurgery- Dr. Deloria Lair    Procedures performed:   None    HPI:   [name] is 81 y.o. male that presents to the hospital with L temporal contusions, acute R SAH temporal, bilateral SDH R>L, small EDH, R temporal fracture after fall.    Hospital Course:   Patient was admitted to the TICU for q1hour neurochecks. Neurosurgery was consulted and recommended AEDs for 7 day course for the above mentioned TBI.  ENT was consulted and recommended following up as an outpatient and obtaining a dedicated temporal bone CT.  He reported having baseline  decreased hearing. He was discharged home within 48 hours.  At the time of discharge the patient was afebrile and his vital signs were within normal limits. he was ambulatory and able to void spontaneously without any difficulty.  he was tolerating a diet and his pain was well-controlled with oral medication.  Discharge Medications:     Current Discharge Medication List      START taking these medications    Details   levETIRAcetam (KEPPRA) 500 MG tablet Take 1 tablet (500 mg total) by mouth every 12 (twelve) hours.  Qty: 10 tablet, Refills: 0         CONTINUE these medications which have NOT CHANGED    Details   amLODIPine (NORVASC) 10 MG tablet Take 10 mg by mouth daily.      atorvastatin (LIPITOR) 40 MG tablet Take 40 mg by mouth daily.      loratadine (CLARITIN) 10 MG tablet Take 10 mg by mouth daily.      losartan (COZAAR) 100 MG tablet Take 100 mg by mouth daily.         STOP taking these medications       aspirin EC 81 MG EC tablet Comments:   Reason for Stopping:  Disposition:   Home     Discharge Instructions:     Activity: activity as tolerated, avoid activities which cause pain. Walking and climbing up stairs are ok, but avoid heavy lifting or strenuous activity for the next 3-4 weeks. No driving while taking narcotic pain medication.    Diet: regular diet .The patient is instructed to drink plenty of fluids to maintain adequate hydration.    Wound Care: none needed .      Follow Up:   Crissie Sickles, MD  [address] [place] [address]  [phone]    Schedule an appointment as soon as possible for a visit in 1 week  will need outpt audiology exam , please call after discharged from the hospital     Primary Care physician     Schedule an appointment as soon as possible for a visit in 3 days  Please follow up with your primary care for syncope work up after discharged     Marlaine Hind, MD  [address]  [place] [address]  [phone]    Schedule an appointment  as soon as possible for a visit  Please call the neurosurgery clinic after discharged to discuss your outpt instructions     [facility]  [address], [address]  [place] [address]  [phone]    As needed        At the time of discharge the patient was given instructions detailing discharge care and time was taken to answer questions.

## 2015-04-26 NOTE — Discharge Instructions (Signed)
TRAUMA SERVICE DISCHARGE INSTRUCTIONS    [address], [address]  [place], [place]  [address]  Phone [phone], [place] [phone]      Discharge Instructions and Follow Up Information:  -Your primary physician team during your stay was the Acute Care Surgery service (ACS) (also known as Trauma), whose surgeons specialize in General Surgery, Trauma Surgery, and Critical Care. Our surgeons are Board Certified in both General Surgery and Surgical Critical Care.    You have been provided with a list of doctors (see above) who treated you during your hospitalization.  Not all patients need to follow up with the surgeon.  If you have any questions or concerns, please call [phone]. There is no need to schedule a follow up appointment with the TRAUMA/CRITICAL CARE CLINIC.     **Please follow up with NEUROSURGERY TEAM, DR HAMILTON, TO SCHEDULE AN APPOINTMENT 4-6 weeks after being discharged from the hospital:  [address]  [place] [address]  [phone]    **Please follow up with ENT, Dr. Lenoria Chime for audiology exam  [address] [place] [address]  [phone]      If you have forms that need a doctor's signature:  - If you have insurance forms, FMLA forms, or worker's compensation forms that need to be filled out by your surgeon, please mail or fax the forms to our office at the following address:  Trauma Services  [address]  [place], [place] [address]  Fax: [phone]    Pain Medication  The ACS service can manage your general postoperative or post-trauma pain. If your pain is from injuries or surgery that was handled by another surgery team or consultant, such as Orthopedics, Neurosurgery, or Spine surgery, we kindly request that you call that surgeon's office to address your pain. If you have follow-up with the Trauma Service, please call the Trauma Service Office for medication refills at [phone].   Please plan accordingly as refills can take up to  24-48 hours.    Taking Pain Medications  It is important to take your medications on time and never take more than the prescribed amount.  Use your pain medication only as directed. Take only the medication that your health care provider tells you to take.  Take your pain medications with some food to avoid an upset stomach.  Remember medications take time to work.  Most pain relievers taken by mouth take at least 20-30 minutes to take effect.  So take your medication at regular times as directed.  Don't wait until the pain gets bad to take it.  Try to time your medication so that you take it before the beginning of an activity, such as dressing or sitting at the table for dinner.  Taking your medication at night may help you get a good night's rest.  If the pain lessens, try taking your pain medication less often. Start by trying to eliminate a dose of pain medication at a time of day when you experience less pain.  If you are not able to completely eliminate the dose, try replacing the dose with a dose of acetaminophen (Tylenol), unless you have been directed not to use acetaminophen use by a physician. In this way, you can slowly wean your use of pain medication as your pain decreases, continuing to eliminate or replace other doses as described above, until you no longer need to take any medicine for pain.   Constipation  is a common side effect of pain medications.  Eating lots of fruits and vegetables and drinking lots of water helps relieve constipation.  You should be taking a stool softener (ex. Colace) as long as you are on any narcotic pain medication.  You should be having a bowel movement at least every 2-3 days.  If not, then proceed with over the counter laxatives (Senokot -S/Miralax), enemas, or suppositories to fix the constipation. You can obtain these at your local pharmacy if you develop constipation.     Avoid drinking alcohol while taking pain medicine.    Avoid driving or operating machinery  while taking pain medication.   Please keep track of how many pills you have left so you will not completely run out before you need a refill.     Call your doctor if you notice any of these symptoms:    . Nausea, vomiting, diarrhea, lasting constipation, or stomach cramps  . Breathing problems or a fast heart rate  . Feeling very tired, sluggish, or dizzy  . Skin rash  . Pain that is not being treated by the prescribed amount of medication

## 2015-04-26 NOTE — Progress Notes (Signed)
NEUROSURGERY ICU PROGRESS NOTE    Date Time: 04/26/2015 5:39 AM  Patient Name: Carlos [name]  Attending Physician:Dr. Rosemarie Beath  Covered By: Neurosurgery PA/pager [phone]    Interim History:   No acute events overnight, patient denies pain, nausea, dizziness    Medications:     Current Facility-Administered Medications   Medication Dose Route Frequency   . amLODIPine  5 mg Oral Daily   . atorvastatin  40 mg Oral Daily   . cetirizine  10 mg Oral Daily   . fosphenytoin MAINTENANCE DOSE  100 mg PE Intravenous Q8H   . losartan  50 mg Oral Daily       Physical Exam:     Filed Vitals:    04/26/15 0400   BP: 119/69   Pulse: 77   Temp: 98.2 F (36.8 C)   Resp:    SpO2: 98%         Intake/Output Summary (Last 24 hours) at 04/26/15 0539  Last data filed at 04/26/15 0400   Gross per 24 hour   Intake    250 ml   Output   2500 ml   Net  -2250 ml     Neuro exam:   Awake alert oriented x3   GCS: 15 E: 4 V: 5 M: 6  Speech is clear   PERRL EOMI, face symmetrical, TML   Strength: LUE 5/5, RUE 5/5   LLE 5/5, RLE 5/5   Sensation intact to light touch   No pronator drift    Labs:     Lab Results   Component Value Date    WBC 12.74* 04/24/2015    HGB 13.9 04/24/2015    HCT 41.4* 04/24/2015    MCV 92.2 04/24/2015    PLT 230 04/24/2015     Lab Results   Component Value Date    NA 140 04/24/2015    K 3.9 04/24/2015    CL 104 04/24/2015    CO2 25 04/24/2015     No results found for: INR, PT  Lab Results   Component Value Date    BUN 16.0 04/24/2015     Lab Results   Component Value Date    CREAT 1.0 04/24/2015       Rads:     Radiology Results (24 Hour)     Procedure Component Value Units Date/Time    CT Internal Auditory Canals / Posterior Fossa WO Contrast [[id number]] Collected:  04/25/15 1502    Order Status:  Completed Updated:  04/25/15 1515    Narrative:      Clinical history: Syncope and head trauma. CT scan of the brain on  04/24/2015 demonstrated a fracture of the squamous portion of the right  temporal bone. Pneumocephalus  was also is detected lateral to the left  temporal lobe suggesting the possibility of an occult fracture of the  left temporal bone.    The following dose reduction techniques were utilized: automatic  exposure control and/or adjustment of mA and/or kV according to patient  size, and the use of iterative reconstruction technique..    There is a nondepressed fracture involving the squamous portion of the  right temporal bone associated with a mild degree of diastases of the  posterior aspect of the right petrosquamous suture. There is  opacification of a few laterally situated right mastoid air cells  suggesting that the fracture complex probably goes through this region  as well. The majority of the right mastoid air cells remain well aerated  as does the right tympanic space, epitympanic space and mastoid antrum.  Right-sided ossicles are normal in appearance.    There is no evidence of a left temporal bone fracture. Mastoid air  cells, tympanic space, epitympanic space and mastoid antrum are clear.  Ossicles are normal.    There is thinning and a small area of dehiscence of bone over the apex  of both superior semicircular canals. Inner ear anatomy is otherwise  unremarkable bilaterally.      Impression:        1. Right temporal bone fracture associated with mild diastases of the  right petrosquamosal suture.  2. No visualized left temporal bone fracture.  3. Thinning and dehiscence of a small area of bone over the apex of both  superior semicircular canals.    Carlos Maidens, MD   04/25/2015 3:11 PM            Assessment:   81 yo M on ASA 81mg  with pmhx HTN, hypercholesterolemia HD#3 (11/22) s/p fall with unknown head injury/LOC. Head CT revealed L temporal contusions, acute R SAH temporal, bilateral SDH R>L, small EDH, and R temporal fracture. GCS 15, neuro intact.    Plan:   Continue medical management per Trauma Team  No anticoagulation/ASA/NSAIDs/Antiplatelets  AED x7 days  No acute Neurosurgical  intervention planned or indicated  Ok for floor from NSGY standpoint    Signed by: Carlos Asper PA-C  Date/Time: 04/26/2015 5:39 AM      I have reviewed the notes and examined the patient with and performed by PA Perry Mount, I concur with her/his documentation of Carlos [name].    Dr. Marlaine Hind      Awake alert oriented x3   GCS: 15 E: 4 V: 5 M: 6  Speech is clear   PERRL EOMI, face symmetrical, TML   Strength: LUE 5/5, RUE 5/5   LLE 5/5, RLE 5/5   Sensation intact to light touch   No pronator drift    A/ P 1. AEDS x 7 days from injury 2. NEuro checks q 2 hrs 3. No NS intervention 4. NS will sign off    JFH

## 2015-04-26 NOTE — Progress Notes (Addendum)
ICU ACUTE CARE SURGERY / TRAUMA PROGRESS NOTE     Date/Time: 04/26/2015 11:24 AM  Patient Name: Carlos [name]  Primary Care Physician: Christa See, MD  Hospital Day: 2     Post-op Day:      Assessment/Plan:   This patient is critically ill and requiring ICU level of care due to Hemodynamic Monitoring.    The patient has the following active problems:  Patient Active Problem List   Diagnosis   . SDH (subdural hematoma)   . SAH (subarachnoid hemorrhage)   . Temporal bone fracture   . Traumatic subarachnoid bleed with LOC of 30 minutes or less   . Traumatic subarachnoid bleed with LOC of 30 minutes or less, initial encounter   . Closed head injury, initial encounter   . Subdural hemorrhage       Plan by systems:  Neuro:  -R temporal bone fracture  -L temporal contusion, SAH R temporal, bilat SDH R>L with 3mm right to left midline shift  -NSGY c/s'd, rec non operative management  -Initially on fosphenytoin, now switched to Keppra for seizure prophylaxis   Seizure Prophylaxis Keppra  Pulm:  -RA, no acute issues  CV:  -HDS  -Continue home meds norvasc, lipitor, cozzar  Endo:  -no acute concerns  GI:  -tolerating reg diet  GI Prophylaxis:  No prophylaxis need, patient is on a diet   Heme/ID:  -no acute concern for infection or bleeding  DVT Prophylaxis: contraindication due to active bleeding or high risk of bleeding   Renal:  Foley: no  Neuromuscular:  Weight Bearing Right Left   Upper Extremity WBAT WBAT   Lower Extremity WBAT WBAT   PT/OT: yes      Dispo:  Discharge to home today. Pt is originially from [place], visiting family here, pt will stay with family for 7 days prior to driving home (wife will drive). Pt instructed to f/u in [place] with PCP for syncope w/u, NSGY for intracranial bleeds, ENT for temporal bone fx and decreased hearing. Pt will have keppra script for a total of 7 days of anti-seizure prophylaxis including doses given here. Return precautions given.    Neurosurgery - c/s    Interval  History:   Carlos [name] is a 81 y.o. male who presents to the hospital after Fall: Yes.     Significant overnight events include none    Allergies:   No Known Allergies    Medications:     Current Facility-Administered Medications   Medication Dose Route Frequency Provider Last Rate Last Dose   . amLODIPine (NORVASC) tablet 5 mg  5 mg Oral Daily Allred, Carly A, MD   5 mg at 04/26/15 0946   . atorvastatin (LIPITOR) tablet 40 mg  40 mg Oral Daily Allred, Carly A, MD   40 mg at 04/26/15 0947   . calcium GLUConate 1 g in sodium chloride 0.9 % 100 mL IVPB  1 g Intravenous PRN Allred, Carly A, MD       . cetirizine (ZyrTEC) tablet 10 mg  10 mg Oral Daily Allred, Carly A, MD   10 mg at 04/24/15 2024   . labetalol (NORMODYNE,TRANDATE) injection 10 mg  10 mg Intravenous Q1H PRN Canary Brim, MD       . levETIRAcetam (KEPPRA) tablet 500 mg  500 mg Oral Q12H [facility] Donnella Bi, MD   500 mg at 04/26/15 0946   . losartan (COZAAR) tablet 50 mg  50 mg Oral Daily Allred, Carly A,  MD   50 mg at 04/26/15 0946   . magnesium sulfate 1g in dextrose 5% IVPB (premix)  1 g Intravenous PRN Allred, Carly A, MD       . ondansetron (ZOFRAN) injection 4 mg  4 mg Intravenous Q8H PRN Allred, Carly A, MD       . potassium chloride 10 mEq in 100 mL IVPB (premix)  10 mEq Intravenous PRN Allred, Carly A, MD       . sodium phosphate 15 mmol in dextrose 5 % 250 mL IVPB  15 mmol Intravenous PRN Allred, Carly A, MD       . sodium phosphate 25 mmol in dextrose 5 % 250 mL IVPB  25 mmol Intravenous PRN Allred, Carly A, MD       . sodium phosphate 35 mmol in dextrose 5 % 250 mL IVPB  35 mmol Intravenous PRN Allred, Carly A, MD           Labs:     Results     Procedure Component Value Units Date/Time    MRSA culture [[id number]] Collected:  04/24/15 1853    Specimen Information:  Body Fluid from Nasal/Throat ASC Admission Updated:  04/25/15 1801    Narrative:      ORDER#: [id number]                                    ORDERED BY: Melrose Nakayama  SOURCE: Nares and Throat                             COLLECTED:  04/24/15 18:53  ANTIBIOTICS AT COLL.:                                RECEIVED :  04/24/15 21:23  Culture MRSA Surveillance                  FINAL       04/25/15 18:01  04/25/15   Negative for Methicillin Resistant Staph aureus from Nares and             Negative for Methicillin Resistant Staph aureus from Throat            Rads:   Radiological Procedure reviewed.    Radiology Results (24 Hour)     Procedure Component Value Units Date/Time    CT Internal Auditory Canals / Posterior Fossa WO Contrast [[id number]] Collected:  04/25/15 1502    Order Status:  Completed Updated:  04/25/15 1515    Narrative:      Clinical history: Syncope and head trauma. CT scan of the brain on  04/24/2015 demonstrated a fracture of the squamous portion of the right  temporal bone. Pneumocephalus was also is detected lateral to the left  temporal lobe suggesting the possibility of an occult fracture of the  left temporal bone.    The following dose reduction techniques were utilized: automatic  exposure control and/or adjustment of mA and/or kV according to patient  size, and the use of iterative reconstruction technique..    There is a nondepressed fracture involving the squamous portion of the  right temporal bone associated with a mild degree of diastases of the  posterior aspect of the right petrosquamous suture. There is  opacification of a few laterally situated right mastoid air  cells  suggesting that the fracture complex probably goes through this region  as well. The majority of the right mastoid air cells remain well aerated  as does the right tympanic space, epitympanic space and mastoid antrum.  Right-sided ossicles are normal in appearance.    There is no evidence of a left temporal bone fracture. Mastoid air  cells, tympanic space, epitympanic space and mastoid antrum are clear.  Ossicles are normal.    There is thinning and a small area of dehiscence of  bone over the apex  of both superior semicircular canals. Inner ear anatomy is otherwise  unremarkable bilaterally.      Impression:        1. Right temporal bone fracture associated with mild diastases of the  right petrosquamosal suture.  2. No visualized left temporal bone fracture.  3. Thinning and dehiscence of a small area of bone over the apex of both  superior semicircular canals.    Gean Maidens, MD   04/25/2015 3:11 PM            Physical Exam:   Temp:  [97.4 F (36.3 C)-98.7 F (37.1 C)] 98.6 F (37 C)  Heart Rate:  [62-77] 70  BP: (90-135)/(55-85) 114/85 mmHg      Invasive ICU Hemodynamics:                 Vital Signs:  Filed Vitals:    04/26/15 1000   BP: 114/85   Pulse: 70   Temp:    Resp:    SpO2: 95%       Vent Settings:             I/O:  Intake and Output Summary (Last 24 hours) at Date Time  I/O last 3 completed shifts:  In: 1475 [I.V.:1375; IV Piggyback:100]  Out: 4700 [Urine:4700]    Output:                                       Nutrition:   Orders Placed This Encounter   Procedures   . Diet regular       Physical Exam:  Physical Exam  Gen: no acute distress  CV: RRR  Pulm: CTAB  Abd: soft, nontender, nondistended  Neuro: awake, alert, answers questions appropriately    Attending Attestation:   I have seen the patient, duplicated the exam and reviewed the flow sheet, labs and imaging studies and edited the note.  I agree with the assessment and plan.        Particia Lather, MD, [facility]  Acute Care Surgeon  (General/Trauma/Critical Care)  [phone]

## 2015-04-26 NOTE — Plan of Care (Signed)
Problem: Safety  Goal: Patient will be free from injury during hospitalization  Pt is alert and oriented x 4, OOB to commode with minimal assistance, steady gait, c/o of right hip pain 2/10. Pt on NC 2L due to sleep apnea, sat's drop in the 70's and heart rate from the 80's to 60's then restarts 3-5 seconds later, MD aware. Pt stated that he had a sleep study done 3-4 years ago. Pt had no BM, frequent urination, 100-150cc.

## 2015-04-30 ENCOUNTER — Telehealth (INDEPENDENT_AMBULATORY_CARE_PROVIDER_SITE_OTHER): Payer: Self-pay

## 2015-04-30 NOTE — Telephone Encounter (Signed)
Spoke with daughter in Social worker. Patient has tickets to fly on 12/4. Discussed that recommendation (per nsgy PA) was not to fly at this time. DIL reports that they have a friend who is a neurosurgeon who is recommending that the patient DOES fly instead of driving.     Number given for Dr. Lennart Pall office to discuss further, however again recommended that patient not fly until he is cleared by Dr. Deloria Lair. Daughter in law verbalized understanding to obtain more information. Will call Dr. Lennart Pall office.     Rolla Flatten, NP [phone]

## 2015-04-30 NOTE — Telephone Encounter (Signed)
Pt's daughter in law called (his wife was on speaker phone).  She said the pt and his wife have changed their plans and want to fly versus drive to [place] and plan to leave 12/4 (10 days after injury). They have a direct flight scheduled.  The wife recalls that someone may have told her not to fly right away.    The question is whether or not it is safe for the pt to fly and if not, what would be the reason?  If they can't fly, how long before they could fly?    Pt DIL contact info - Consuello Masse [phone].    Royetta Car, RN

## 2015-05-01 ENCOUNTER — Other Ambulatory Visit: Payer: Self-pay | Admitting: Neurological Surgery

## 2015-05-02 ENCOUNTER — Telehealth (INDEPENDENT_AMBULATORY_CARE_PROVIDER_SITE_OTHER): Payer: Self-pay

## 2015-05-02 NOTE — Telephone Encounter (Signed)
Telephone call from patient friend Aurea Graff asking if there are any traveling/flying restrictions? Per Dr Deloria Lair patient may fly home with no restrictions.

## 2020-01-17 LAB — CBC AND DIFFERENTIAL
HCT: 40 — AB (ref 41–53)
Hemoglobin: 13.1 — AB (ref 13.5–17.5)

## 2020-01-17 LAB — VITAMIN B12: Vitamin B-12: 514

## 2020-01-17 LAB — TSH: TSH: 2.36 (ref <=5.90)

## 2020-01-17 LAB — HEMOGLOBIN A1C: Hemoglobin A1C: 6.7

## 2020-01-23 LAB — BASIC METABOLIC PANEL
BUN: 24 — AB (ref 4–21)
CO2: 31 — AB (ref 13–22)
Chloride: 104 (ref 99–108)
Creatinine: 1.1 (ref 0.6–1.3)
Glucose: 94
Potassium: 4 (ref 3.4–5.3)
Sodium: 142 (ref 137–147)

## 2020-01-23 LAB — HEPATIC FUNCTION PANEL
ALT: 23 (ref 10–40)
AST: 27 (ref 14–40)

## 2020-01-23 LAB — COMPREHENSIVE METABOLIC PANEL: Calcium: 8.8 (ref 8.7–10.7)

## 2020-02-28 ENCOUNTER — Non-Acute Institutional Stay (SKILLED_NURSING_FACILITY): Payer: Medicare HMO | Admitting: Internal Medicine

## 2020-02-28 DIAGNOSIS — N401 Enlarged prostate with lower urinary tract symptoms: Secondary | ICD-10-CM

## 2020-02-28 DIAGNOSIS — R350 Frequency of micturition: Secondary | ICD-10-CM

## 2020-02-28 DIAGNOSIS — Z66 Do not resuscitate: Secondary | ICD-10-CM

## 2020-02-28 DIAGNOSIS — E1122 Type 2 diabetes mellitus with diabetic chronic kidney disease: Secondary | ICD-10-CM

## 2020-02-28 DIAGNOSIS — I4510 Unspecified right bundle-branch block: Secondary | ICD-10-CM

## 2020-02-28 DIAGNOSIS — F1020 Alcohol dependence, uncomplicated: Secondary | ICD-10-CM | POA: Insufficient documentation

## 2020-02-28 DIAGNOSIS — F0390 Unspecified dementia without behavioral disturbance: Secondary | ICD-10-CM | POA: Insufficient documentation

## 2020-02-28 DIAGNOSIS — D6869 Other thrombophilia: Secondary | ICD-10-CM

## 2020-02-28 DIAGNOSIS — F0281 Dementia in other diseases classified elsewhere with behavioral disturbance: Secondary | ICD-10-CM

## 2020-02-28 DIAGNOSIS — I4891 Unspecified atrial fibrillation: Secondary | ICD-10-CM

## 2020-02-28 DIAGNOSIS — S065XAA Traumatic subdural hemorrhage with loss of consciousness status unknown, initial encounter: Secondary | ICD-10-CM | POA: Insufficient documentation

## 2020-02-28 DIAGNOSIS — S065X9A Traumatic subdural hemorrhage with loss of consciousness of unspecified duration, initial encounter: Secondary | ICD-10-CM

## 2020-02-28 DIAGNOSIS — H9113 Presbycusis, bilateral: Secondary | ICD-10-CM

## 2020-02-28 DIAGNOSIS — E785 Hyperlipidemia, unspecified: Secondary | ICD-10-CM

## 2020-02-28 DIAGNOSIS — I480 Paroxysmal atrial fibrillation: Secondary | ICD-10-CM | POA: Diagnosis not present

## 2020-02-28 DIAGNOSIS — E1169 Type 2 diabetes mellitus with other specified complication: Secondary | ICD-10-CM

## 2020-02-28 DIAGNOSIS — H9193 Unspecified hearing loss, bilateral: Secondary | ICD-10-CM | POA: Insufficient documentation

## 2020-02-28 DIAGNOSIS — G301 Alzheimer's disease with late onset: Secondary | ICD-10-CM | POA: Diagnosis not present

## 2020-02-28 DIAGNOSIS — F02818 Dementia in other diseases classified elsewhere, unspecified severity, with other behavioral disturbance: Secondary | ICD-10-CM

## 2020-02-28 DIAGNOSIS — G5601 Carpal tunnel syndrome, right upper limb: Secondary | ICD-10-CM

## 2020-02-28 DIAGNOSIS — N183 Chronic kidney disease, stage 3 unspecified: Secondary | ICD-10-CM

## 2020-02-28 HISTORY — DX: Type 2 diabetes mellitus with diabetic chronic kidney disease: E11.22

## 2020-02-28 HISTORY — DX: Type 2 diabetes mellitus with other specified complication: E11.69

## 2020-02-28 HISTORY — DX: Hyperlipidemia, unspecified: E78.5

## 2020-02-28 LAB — BASIC METABOLIC PANEL
CO2: 32 — AB (ref 13–22)
Chloride: 100 (ref 99–108)
Creatinine: 1.3 (ref 0.6–1.3)
Potassium: 4.2 (ref 3.4–5.3)
Sodium: 139 (ref 137–147)

## 2020-02-28 NOTE — Progress Notes (Signed)
Provider:  Gwenith Spitz. Renato Gails, D.O., C.M.D. Location:   [facility] Room Number: 315 Place of Service:  SNF (31)  PCP: Kermit Balo, DO Patient Care Team: Kermit Balo, DO as PCP - General (Geriatric Medicine) Community, [facility] (Skilled Nursing Facility)  Daughter-in-law, Boleslaw Borghi [phone]  Code Status: DNR Goals of Care: Advanced Directive information Advanced Directives 02/28/2020  Does Patient Have a Medical Advance Directive? Yes  Type of Estate agent of [place];Living will;Out of facility DNR (pink MOST or yellow form)  Does patient want to make changes to medical advance directive? No - Patient declined  Copy of Healthcare Power of Attorney in Chart? No - copy requested  Pre-existing out of facility DNR order (yellow form or pink MOST form) Yellow form placed in chart (order not valid for inpatient use)   Chief Complaint  Patient presents with  . New Admit To SNF    New Admit to [facility]    HPI: Patient is a 86 y.o. male seen today for admission to [facility] memory care.  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  His previous physician was Dr. Judie Petit. [facility] Hughs in [facility].  He had [facility] with a caregiver 12 hours/day--Nadine.  His wife passed away 7 mos ago from lung cancer.  He lived in a high rise condo--at that time he was alert and oriented.  Maybe his wife covered for him per DIL Annice Pih.  He had gradual progression--neuropsych diagnosed dementia.  He was started on namenda, but he had a bad reaction with hallucinations and vivid nightmares.  No  other meds tried since.  He quickly developed aggression and wanted to be independent.  Wanted to drive.  He was refusing care and wanting to wander around downtown [facility].  He was then placed temporarily in a [facility] care facility[facility].  He developed pneumonia, had sepsis requiring critical care.  He was discharged and then had pressure ulcer and abscess.  Gloriajean Dell took him to urgent care to address it--it was MRSA--had abx for that.  He's not had a follow-up chest xray.  Discussed that he's doing well clinically and does not seem necessary now.  He's on the seroquel 50mg  nightly.      For his chronic atrial fibrillation he has been maintained on metoprolol succinate, digoxin, and Eliquis 2.5 mg p.o. twice daily. Dig level was 0.7 in august.    For his hypertension with chronic kidney disease 3a,  his BMP has been monitored carefully.  He has a history of cardiomyopathy but no normal ejection fraction.  He has been having problems longstanding with lower extremity edema and this is maintained on Lasix and potassium and got significantly worse when that was discontinued due to his urinary frequency.  In terms of his dementia, he has been on Seroquel at bedtime and trazodone as well.  Notes indicate that he requested to be able to drive again in September but this wish was denied.  Has had fluctuations with more confusion certain days than others but he was not combative.  His appetite had been good at his previous location.  He's done well here so far.  Nursing had  no concerns about his mood or agitation when I saw him.  As far as type 2 diabetes, he's been on diet only.  hba1c was great at 6.7 01/17/20.  For hyperlipidemia he has been taking atorvastatin.  He has a history of right bundle branch block.  He has a history of posttraumatic subdural hematoma.  Neurosurgery had cleared him in terms of using Eliquis.  He has had carpal tunnel syndrome of his right wrist which is  being monitored.  For his bilateral hearing loss he wears hearing aids.  He had his moderna covid vaccines on 2/12 and 08/12/[facility] [place]  He even had the booster 02/04/20.    Past Medical History:  Diagnosis Date  . Carpal tunnel syndrome   . Chronic atrial fibrillation, unspecified (HCC)   . Chronic kidney disease   . Chronic kidney disease, stage 3a (HCC)   . Degenerative disease of nervous system (HCC)   . Hyperlipidemia   . Localized edema   . Male erectile disorder   . Other thrombophilia (HCC)   . Personal history of other (healed) physical injury and trauma   . Rhinitis, allergic   . Trigger finger   . Type 2 diabetes mellitus (HCC)   . Unspecified convulsions (HCC)   . Unspecified dementia with behavioral disturbance (HCC)    History reviewed. No pertinent surgical history.  Social History   Socioeconomic History  . Marital status: Not on file    Spouse name: Not on file  . Number of children: Not on file  . Years of education: Not on file  . Highest education level: Not on file  Occupational History  . Not on file  Tobacco Use  . Smoking status: Not on file  Substance and Sexual Activity  . Alcohol use: Not on file  . Drug use: Not on file  . Sexual activity: Not on file  Other Topics Concern  . Not on file  Social History Narrative  . Not on file   Social Determinants of Health   Financial Resource Strain:   . Difficulty of Paying Living Expenses: Not on file  Food Insecurity:   . Worried About Programme researcher, broadcasting/film/videounning Out of Food in the Last Year: Not on file  . Ran Out of Food in the Last Year: Not on file  Transportation Needs:   . Lack of Transportation (Medical): Not on file  . Lack of Transportation (Non-Medical): Not on file  Physical Activity:   . Days of Exercise per Week: Not on file  . Minutes of Exercise per Session: Not on file  Stress:   . Feeling of Stress : Not on file  Social Connections:   . Frequency of Communication with Friends and  Family: Not on file  . Frequency of Social Gatherings with Friends and Family: Not on file  . Attends Religious Services: Not on file  . Active Member of Clubs or Organizations: Not on file  . Attends BankerClub or Organization Meetings: Not on file  . Marital Status: Not on file    has no history on file for tobacco use, alcohol use, and drug use.  Functional Status Survey:    History reviewed. No pertinent family history.  Health Maintenance  Topic Date Due  . HEMOGLOBIN A1C  Never done  . FOOT EXAM  Never done  . OPHTHALMOLOGY EXAM  Never done  . URINE MICROALBUMIN  Never done  . COVID-19 Vaccine (1) Never done  . TETANUS/TDAP  Never done  .  PNA vac Low Risk Adult (1 of 2 - PCV13) Never done  . INFLUENZA VACCINE  Never done    No Known Allergies  Outpatient Encounter Medications as of 02/28/2020  Medication Sig  . apixaban (ELIQUIS) 2.5 MG TABS tablet Take 2.5 mg by mouth 2 (two) times daily.  Marland Kitchen atorvastatin (LIPITOR) 40 MG tablet Take 40 mg by mouth daily.  . digoxin (LANOXIN) 0.125 MG tablet Take 0.125 mg by mouth daily.  . furosemide (LASIX) 20 MG tablet Take 20 mg by mouth 2 (two) times daily.  . Metoprolol Tartrate 75 MG TABS Take 75 mg by mouth. 3 tablets; oral Special instruction; administer 75 mg q12h.  every 12 hours 8:00pm 8:00am  . Multiple Vitamins-Minerals (MULTI-DAY PLUS MINERALS PO) Take 18 mg by mouth. Iron-400 mcg-25 mcg; amt: 1 tablet; oral. Once a morning  . POTASSIUM CHLORIDE PO Take 10 mEq by mouth daily.  . QUEtiapine (SEROQUEL) 50 MG tablet Take 50 mg by mouth at bedtime.  . traZODone (DESYREL) 50 MG tablet Take 50 mg by mouth at bedtime.   No facility-administered encounter medications on file as of 02/28/2020.    Review of Systems  Constitutional: Negative for chills, fever and malaise/fatigue.  HENT: Positive for hearing loss. Negative for congestion and sore throat.   Eyes: Negative for blurred vision.  Respiratory: Negative for cough and shortness  of breath.   Cardiovascular: Positive for leg swelling. Negative for chest pain, palpitations, orthopnea and PND.  Gastrointestinal: Negative for abdominal pain, blood in stool, constipation and melena.  Genitourinary: Negative for dysuria.  Musculoskeletal: Negative for falls and joint pain.  Skin: Negative for rash.  Neurological: Negative for dizziness and loss of consciousness.  Endo/Heme/Allergies: Bruises/bleeds easily.  Psychiatric/Behavioral: Positive for memory loss. Negative for depression and hallucinations. The patient is not nervous/anxious and does not have insomnia.     Vitals:   02/29/20 1730  BP: (!) 103/54  Pulse: 63  Temp: 97.9 F (36.6 C)  Weight: 180 lb 9.6 oz (81.9 kg)  Height: 1' (0.305 m)   Body mass index is 881.78 kg/m. Physical Exam Vitals reviewed.  Constitutional:      General: He is not in acute distress.    Appearance: Normal appearance. He is not toxic-appearing.     Comments: Tall male, NAD, walking around unit w/o assistive device  HENT:     Head: Normocephalic and atraumatic.     Right Ear: External ear normal.     Left Ear: External ear normal.     Nose: Nose normal.     Mouth/Throat:     Pharynx: Oropharynx is clear.  Eyes:     Extraocular Movements: Extraocular movements intact.     Conjunctiva/sclera: Conjunctivae normal.     Pupils: Pupils are equal, round, and reactive to light.  Cardiovascular:     Rate and Rhythm: Rhythm irregular.     Heart sounds: No murmur heard.   Pulmonary:     Effort: Pulmonary effort is normal.     Breath sounds: Normal breath sounds. No wheezing, rhonchi or rales.  Abdominal:     General: Bowel sounds are normal.     Palpations: Abdomen is soft.     Tenderness: There is no abdominal tenderness.  Musculoskeletal:        General: Normal range of motion.     Cervical back: Neck supple.     Right lower leg: Edema present.     Left lower leg: Edema present.  Lymphadenopathy:  Cervical: No  cervical adenopathy.  Skin:    General: Skin is warm and dry.  Neurological:     General: No focal deficit present.     Mental Status: He is alert. Mental status is at baseline.     Motor: No weakness.     Gait: Gait normal.     Comments: Oriented to self  Psychiatric:        Mood and Affect: Mood normal.     Comments: Very pleasant, talking about his travels and his family in photos on the wall     Labs reviewed: Basic Metabolic Panel: Recent Labs    01/23/20 0000 02/28/20 0000 03/01/20 0000  NA 142 139  --   K 4.0 4.2  --   CL 104 100  --   CO2 31* 32*  --   BUN 24*  --   --   CREATININE 1.1 1.3  --   CALCIUM 8.8  --  9.5   Liver Function Tests: Recent Labs    01/23/20 0000  AST 27  ALT 23   No results for input(s): LIPASE, AMYLASE in the last 8760 hours. No results for input(s): AMMONIA in the last 8760 hours. CBC: Recent Labs    01/17/20 0000  HGB 13.1*  HCT 40*   Cardiac Enzymes: No results for input(s): CKTOTAL, CKMB, CKMBINDEX, TROPONINI in the last 8760 hours. BNP: Invalid input(s): POCBNP No results found for: HGBA1C No results found for: TSH Lab Results  Component Value Date   VITAMINB12 514 01/17/2020   No results found for: FOLATE No results found for: IRON, TIBC, FERRITIN   Assessment/Plan 1. Late onset Alzheimer's disease with behavioral disturbance (HCC) -here now for long-term care in memory care SNF -appears to be adjusting well -receiving seroquel and trazodone at hs to calm him and help him rest--these were started priro to his move here  2. Subdural hematoma (HCC) -h/o from fall and likely related to #3  3. Uncomplicated alcohol dependence (HCC) -not active at this time  4. Paroxysmal A-fib (HCC) -continues on eliquis therapy, dig and metoprolol tartrate for rate control -will need dig level 1-2 times per year and blood counts, renal function to monitor these meds  5. Hypercoagulable state due to atrial fibrillation  (HCC) -cont eliquis therapy at low dose at his advanced age and renal function  6. Right bundle branch block (RBBB) -chronic and stable  7. Presbycusis of both ears -significant impairment, I'm sure if he could read lips it would help but masking prohibits this--also affects his cognition even more  8. Right carpal tunnel syndrome -did not c/o this for me  9. Hyperlipidemia associated  With diabetes (HCC) -remains on statin therapy at 40mg , if going to maintain the medication, need LDL to be at goal of less than 70  10. CKD stage 3 due to type 2 diabetes mellitus (HCC) -Avoid nephrotoxic agents like nsaids, dose adjust renally excreted meds, hydrate.  11. Benign prostatic hyperplasia with urinary frequency -also is on diuretics and has edema of his legs, not on meds for OAB or BPH, monitor  12. DNR (do not resuscitate) - Do not attempt resuscitation (DNR)  Family/ staff Communication: discussed with [facility] nurse and his caregiver  Labs/tests ordered:  No new  Andee Chivers L. Francesco Provencal, D.O. Geriatrics [facility] [address] [place], [place] Cell Phone (Mon-Fri 8am-5pm):  [phone] On Call:  [phone] & follow prompts after 5pm & weekends Office Phone:  [phone] Office Fax:  [phone]

## 2020-02-29 ENCOUNTER — Encounter: Payer: Self-pay | Admitting: Internal Medicine

## 2020-03-01 ENCOUNTER — Encounter: Payer: Self-pay | Admitting: Internal Medicine

## 2020-03-01 LAB — COMPREHENSIVE METABOLIC PANEL: Calcium: 9.5 (ref 8.7–10.7)

## 2020-03-01 LAB — BASIC METABOLIC PANEL: Glucose: 97

## 2020-03-06 ENCOUNTER — Encounter: Payer: Self-pay | Admitting: Internal Medicine

## 2020-03-14 ENCOUNTER — Non-Acute Institutional Stay (SKILLED_NURSING_FACILITY): Payer: Medicare HMO | Admitting: Internal Medicine

## 2020-03-14 ENCOUNTER — Encounter: Payer: Self-pay | Admitting: Internal Medicine

## 2020-03-14 DIAGNOSIS — W19XXXA Unspecified fall, initial encounter: Secondary | ICD-10-CM

## 2020-03-14 DIAGNOSIS — M25552 Pain in left hip: Secondary | ICD-10-CM

## 2020-03-14 DIAGNOSIS — Z87828 Personal history of other (healed) physical injury and trauma: Secondary | ICD-10-CM | POA: Diagnosis not present

## 2020-03-14 NOTE — Progress Notes (Signed)
Location:  Medical illustrator of Service:  SNF Provider:  Derionna Salvador L. Renato Gails, D.O., C.M.D.  Kermit Balo, DO  Patient Care Team: Kermit Balo, DO as PCP - General (Geriatric Medicine) Community, [facility] (Skilled Nursing Facility)  Extended Emergency Contact Information Primary Emergency Contact: [name] Address: [address]          [place], [place] [address] D[place] of [place] Home Phone: [phone] Relation: Relative Secondary Emergency Contact: [name] Address: [address]          [place], [place] [address] [place] of [place] Mobile Phone: [phone] Relation: Son  Code Status:  DNR Goals of care: Advanced Directive information Advanced Directives 03/14/2020  Does Patient Have a Medical Advance Directive? Yes  Type of Advance Directive Out of facility DNR (pink MOST or yellow form);Living will;Healthcare Power of Attorney  Does patient want to make changes to medical advance directive? No - Patient declined  Copy of Healthcare Power of Attorney in Chart? Yes - validated most recent copy scanned in chart (See row information)  Pre-existing out of facility DNR order (yellow form or pink MOST form) Pink MOST/Yellow Form most recent copy in chart - Physician notified to receive inpatient order   Chief Complaint  Patient presents with  . Acute Visit    fall l side buttocks   HPI:  Pt is a 86 y.o. male with h/o SDH, Alzheimer's disease seen today for an acute visit for fall this morning.    Per nursing:  "Resident reported that he had a fall this morning. He has an abrasion to his back just left of midline. I covered this with a dry dressing. He is complaining of pain on his left side buttocks. No pain at the hip joint and he is able to walk as usual without assistance or assistive devices. I assessed the rest of his skin and found no other injuries. He denies hitting his head and has no signs of head trauma.  Spoke with resident's POA Annice Pih who requested that resident be assessed by a physician. I have asked Dr. Renato Gails to visit the unit and see him. VS - 97.4, 90, 16, 150/68. 97% sat on room air. Standing order initiated for Tylenol for pain."  When seen, he did not seem any different cognitively.  He was talkative, but most of his speech was a word salad not related to what I asked him with a few parts making sense about what was asked.  This is not different from 2 wks ago.  He did c/o pain in his left hip.   He rose from his chair a bit slower and was walking more slowly.  He denied pain anywhere else and was moving all of his other parts as usual.  No bruising had come up at this point.    I was also asked to address some concerns from the care plan meeting earlier in the day including changing him to fiber thins from miralax for constipation and being sure he's seen by ST and OT. Nursing had scheduled his dental and podiatry visits. Past Medical History:  Diagnosis Date  . Carpal tunnel syndrome   . Chronic atrial fibrillation, unspecified (HCC)   . Chronic kidney disease   . Chronic kidney disease, stage 3a (HCC)   . Degenerative disease of nervous system (HCC)   . Hyperlipidemia   . Localized edema   . Male erectile disorder   .  Other thrombophilia (HCC)   . Personal history of other (healed) physical injury and trauma   . Rhinitis, allergic   . Trigger finger   . Type 2 diabetes mellitus (HCC)   . Unspecified convulsions (HCC)   . Unspecified dementia with behavioral disturbance (HCC)    No past surgical history on file.  No Known Allergies  Outpatient Encounter Medications as of 03/14/2020  Medication Sig  . apixaban (ELIQUIS) 2.5 MG TABS tablet Take 2.5 mg by mouth 2 (two) times daily.  Marland Kitchen atorvastatin (LIPITOR) 40 MG tablet Take 40 mg by mouth daily.  . digoxin (LANOXIN) 0.125 MG tablet Take 0.125 mg by mouth daily.  . furosemide (LASIX) 20 MG tablet Take 20 mg by mouth 2 (two)  times daily.  . Metoprolol Tartrate 75 MG TABS Take 75 mg by mouth. 3 tablets; oral Special instruction; administer 75 mg q12h.  every 12 hours 8:00pm 8:00am  . Multiple Vitamins-Minerals (MULTI-DAY PLUS MINERALS PO) Take 18 mg by mouth. Iron-400 mcg-25 mcg; amt: 1 tablet; oral. Once a morning  . POTASSIUM CHLORIDE PO Take 10 mEq by mouth daily.  . QUEtiapine (SEROQUEL) 50 MG tablet Take 50 mg by mouth at bedtime.  . traZODone (DESYREL) 50 MG tablet Take 50 mg by mouth at bedtime.   No facility-administered encounter medications on file as of 03/14/2020.    Review of Systems  Constitutional: Negative for chills, fever and malaise/fatigue.  Respiratory: Negative for shortness of breath.   Cardiovascular: Positive for leg swelling. Negative for chest pain and palpitations.       Compression socks  Gastrointestinal: Negative for abdominal pain.  Genitourinary: Negative for dysuria.  Musculoskeletal: Positive for falls, joint pain and myalgias.       Left hip pain  Neurological: Negative for loss of consciousness.       Pleasant, aphasic  Psychiatric/Behavioral: Positive for memory loss.    Immunization History  Administered Date(s) Administered  . Influenza-Unspecified 03/11/2011, 03/08/2012, 03/02/2013, 08/09/2013, 02/27/2016, 03/02/2017, 01/01/2019  . Pneumococcal Conjugate-13 06/13/2009  . Zoster 04/02/2010   Pertinent  Health Maintenance Due  Topic Date Due  . HEMOGLOBIN A1C  Never done  . FOOT EXAM  Never done  . OPHTHALMOLOGY EXAM  Never done  . URINE MICROALBUMIN  Never done  . PNA vac Low Risk Adult (2 of 2 - PPSV23) 06/13/2010  . INFLUENZA VACCINE  01/01/2020   Fall Risk  03/14/2020  Falls in the past year? 1  Number falls in past yr: 0  Injury with Fall? 1   Functional Status Survey:    Vitals:   03/14/20 1344  BP: (!) 151/86  Pulse: 70  Temp: (!) 97.4 F (36.3 C)  SpO2: 97%  Weight: 178 lb 9.6 oz (81 kg)  Height: 6\' 1"  (1.854 m)   Body mass index is  23.56 kg/m. Physical Exam Vitals reviewed.  Constitutional:      Appearance: Normal appearance.  HENT:     Head: Normocephalic and atraumatic.  Eyes:     Extraocular Movements: Extraocular movements intact.     Conjunctiva/sclera: Conjunctivae normal.     Pupils: Pupils are equal, round, and reactive to light.  Cardiovascular:     Rate and Rhythm: Rhythm irregular.     Heart sounds: No murmur heard.   Pulmonary:     Effort: Pulmonary effort is normal.     Breath sounds: Normal breath sounds.  Abdominal:     General: Bowel sounds are normal.     Palpations: Abdomen  is soft.  Musculoskeletal:        General: Normal range of motion.     Right lower leg: No edema.     Left lower leg: No edema.  Skin:    General: Skin is warm and dry.  Neurological:     Mental Status: He is alert. Mental status is at baseline.     Comments: Slower gait and get up and go time  Psychiatric:        Mood and Affect: Mood normal.     Labs reviewed: Recent Labs    01/23/20 0000 02/28/20 0000 03/01/20 0000  NA 142 139  --   K 4.0 4.2  --   CL 104 100  --   CO2 31* 32*  --   BUN 24*  --   --   CREATININE 1.1 1.3  --   CALCIUM 8.8  --  9.5   Recent Labs    01/23/20 0000  AST 27  ALT 23   Recent Labs    01/17/20 0000  HGB 13.1*  HCT 40*   No results found for: TSH No results found for: HGBA1C No results found for: CHOL, HDL, LDLCALC, LDLDIRECT, TRIG, CHOLHDL  Significant Diagnostic Results in last 30 days:  No results found.  Assessment/Plan 1. Fall, initial encounter -only complaint and change is left hip pain -neurochecks intact just moving slower -VS wnl  2. Acute hip pain, left Portable xrays left hip and pelvis--3 views. Use ice if needed and tylenol for pain  3. History of subdural hematoma (post traumatic) -family concerned due to this history -no new deficits noted so putting him through a transfer to the hospital amid a pandemic for a CT does not seem  necessary--monitor neurochecks per facility protocol and if anything becomes concerning (focal deficits, change in LOC), would then order CT  Family/ staff Communication: d/w SNF nurse  Labs/tests ordered:  xrays of left hip and pelvis  Zyanna Leisinger L. Uriah Philipson, D.O. Geriatrics [facility] [address], [place] [address] Cell Phone (Mon-Fri 8am-5pm):  [phone] On Call:  [phone] & follow prompts after 5pm & weekends Office Phone:  [phone] Office Fax:  [phone]

## 2020-03-19 ENCOUNTER — Encounter: Payer: Self-pay | Admitting: Internal Medicine

## 2020-03-22 ENCOUNTER — Other Ambulatory Visit: Payer: Self-pay | Admitting: Internal Medicine

## 2020-03-22 DIAGNOSIS — R52 Pain, unspecified: Secondary | ICD-10-CM

## 2020-03-23 ENCOUNTER — Ambulatory Visit
Admission: RE | Admit: 2020-03-23 | Discharge: 2020-03-23 | Disposition: A | Discharge status: Home / Self Care | Payer: Medicare HMO | Source: Ambulatory Visit | Attending: Internal Medicine | Admitting: Internal Medicine

## 2020-03-23 DIAGNOSIS — R52 Pain, unspecified: Secondary | ICD-10-CM

## 2020-03-26 ENCOUNTER — Encounter: Payer: Self-pay | Admitting: Adult Health

## 2020-03-26 ENCOUNTER — Non-Acute Institutional Stay (SKILLED_NURSING_FACILITY): Payer: Medicare HMO | Admitting: Adult Health

## 2020-03-26 DIAGNOSIS — D6869 Other thrombophilia: Secondary | ICD-10-CM

## 2020-03-26 DIAGNOSIS — I480 Paroxysmal atrial fibrillation: Secondary | ICD-10-CM

## 2020-03-26 DIAGNOSIS — W19XXXD Unspecified fall, subsequent encounter: Secondary | ICD-10-CM | POA: Diagnosis not present

## 2020-03-26 DIAGNOSIS — N200 Calculus of kidney: Secondary | ICD-10-CM

## 2020-03-26 DIAGNOSIS — I4891 Unspecified atrial fibrillation: Secondary | ICD-10-CM

## 2020-03-26 DIAGNOSIS — T148XXA Other injury of unspecified body region, initial encounter: Secondary | ICD-10-CM

## 2020-03-26 NOTE — Progress Notes (Signed)
Location:  Medical illustrator of Service:  SNF (31) Provider:   Peggye Ley, ANP [facility] [phone]   Kermit Balo, DO  Patient Care Team: Kermit Balo, DO as PCP - General (Geriatric Medicine) Community, [facility] (Skilled Nursing Facility)  Extended Emergency Contact Information Primary Emergency Contact: [name] Address: [address]          [place], [place] [address] Darden Amber of [place] Home Phone: [phone] Relation: Relative Secondary Emergency Contact: [name] Address: [address]          [place], [place] [address] Darden Amber of [place] Mobile Phone: [phone] Relation: Son  Code Status:  DNR Goals of care: Advanced Directive information Advanced Directives 03/14/2020  Does Patient Have a Medical Advance Directive? Yes  Type of Advance Directive Out of facility DNR (pink MOST or yellow form);Living will;Healthcare Power of Attorney  Does patient want to make changes to medical advance directive? No - Patient declined  Copy of Healthcare Power of Attorney in Chart? Yes - validated most recent copy scanned in chart (See row information)  Pre-existing out of facility DNR order (yellow form or pink MOST form) Pink MOST/Yellow Form most recent copy in chart - Physician notified to receive inpatient order     Chief Complaint  Patient presents with  . Acute Visit    f/u CT results    HPI:  Pt is a 86 y.o. male seen today for an acute visit for regarding CT Results. Pt fell on 10/13 and got himself back up and then reported the fall. He has dementia and resides in the memory care setting and is a poor historian. Later he reported right buttock and hip pain. He was assessed by Dr. Renato Gails and an xray of the right hip and pelvis was ordered which was negative for acute fracture. Pain persisted. A CT of the pelvis was ordered which showed the following:  IMPRESSION:03/23/20 1.  Intramuscular hematoma involving the left gluteus maximus muscle measuring approximately 6.3 cm. No acute displaced fracture. 2. There appears to be a large, potentially mildly obstructing stone in the distal right ureter at the right UVJ measuring approximately 1.3 cm. 3. There is soft tissue edema overlying the left hip. 4. Prostate gland is significantly enlarged.  At this time he has mild buttock pain and is using tylenol. He is not having any back pain or fever.  Past Medical History:  Diagnosis Date  . Carpal tunnel syndrome   . Chronic atrial fibrillation, unspecified (HCC)   . Chronic kidney disease   . Chronic kidney disease, stage 3a (HCC)   . Degenerative disease of nervous system (HCC)   . Hyperlipidemia   . Localized edema   . Male erectile disorder   . Other thrombophilia (HCC)   . Personal history of other (healed) physical injury and trauma   . Rhinitis, allergic   . Trigger finger   . Type 2 diabetes mellitus (HCC)   . Unspecified convulsions (HCC)   . Unspecified dementia with behavioral disturbance (HCC)   . Urolithiasis    Past Surgical History:  Procedure Laterality Date  . CRANIOTOMY     with evacuation of subdural hematoma  . EXTRACORPOREAL SHOCK WAVE LITHOTRIPSY      No Known Allergies  Outpatient Encounter Medications as of 03/26/2020  Medication Sig  . acetaminophen (TYLENOL) 500 MG tablet Take 500 mg by mouth in the morning, at noon, and at  bedtime.  Marland Kitchen apixaban (ELIQUIS) 2.5 MG TABS tablet Take 2.5 mg by mouth 2 (two) times daily.  Marland Kitchen atorvastatin (LIPITOR) 40 MG tablet Take 40 mg by mouth daily.  . digoxin (LANOXIN) 0.125 MG tablet Take 0.125 mg by mouth daily.  . furosemide (LASIX) 20 MG tablet Take 20 mg by mouth 2 (two) times daily.  . Glucerna (GLUCERNA) LIQD Take 237 mLs by mouth daily.  . Metoprolol Tartrate 75 MG TABS Take 75 mg by mouth. 3 tablets; oral Special instruction; administer 75 mg q12h.  every 12 hours 8:00pm 8:00am  .  Multiple Vitamins-Minerals (MULTI-DAY PLUS MINERALS PO) Take 18 mg by mouth. Iron-400 mcg-25 mcg; amt: 1 tablet; oral. Once a morning  . POTASSIUM CHLORIDE PO Take 10 mEq by mouth daily.  . QUEtiapine (SEROQUEL) 50 MG tablet Take 50 mg by mouth at bedtime.  . traZODone (DESYREL) 50 MG tablet Take 50 mg by mouth at bedtime.   No facility-administered encounter medications on file as of 03/26/2020.    Review of Systems  Constitutional: Negative for activity change, appetite change, chills, diaphoresis, fatigue, fever and unexpected weight change.  Respiratory: Negative for cough, shortness of breath, wheezing and stridor.   Cardiovascular: Positive for leg swelling. Negative for chest pain and palpitations.  Gastrointestinal: Negative for abdominal distention, abdominal pain, constipation and diarrhea.  Genitourinary: Negative for difficulty urinating and dysuria.  Musculoskeletal: Positive for gait problem. Negative for arthralgias, back pain, joint swelling and myalgias.       Left hip/buttockpain  Skin: Positive for color change.  Neurological: Negative for dizziness, seizures, syncope, facial asymmetry, speech difficulty, weakness and headaches.  Hematological: Negative for adenopathy. Does not bruise/bleed easily.  Psychiatric/Behavioral: Positive for confusion. Negative for agitation and behavioral problems (resistance to care).    Immunization History  Administered Date(s) Administered  . Influenza-Unspecified 03/11/2011, 03/08/2012, 03/02/2013, 08/09/2013, 02/27/2016, 03/02/2017, 01/01/2019  . Moderna SARS-COVID-2 Vaccination 07/05/2019, 08/12/2019  . Pneumococcal Conjugate-13 06/13/2009  . Zoster 04/02/2010   Pertinent  Health Maintenance Due  Topic Date Due  . FOOT EXAM  Never done  . OPHTHALMOLOGY EXAM  Never done  . URINE MICROALBUMIN  Never done  . PNA vac Low Risk Adult (2 of 2 - PPSV23) 06/13/2010  . INFLUENZA VACCINE  01/01/2020  . HEMOGLOBIN A1C  07/19/2020   Fall  Risk  03/17/2020 03/14/2020  Falls in the past year? 1 1  Number falls in past yr: 1 0  Injury with Fall? 1 1  Risk for fall due to : History of fall(s);Impaired balance/gait;Impaired mobility;Mental status change;Medication side effect -  Follow up Falls evaluation completed;Education provided;Falls prevention discussed;Follow up appointment -   Functional Status Survey:    Vitals:   03/26/20 1648  Temp: 97.9 F (36.6 C)  Weight: 190 lb 6.4 oz (86.4 kg)   Body mass index is 25.12 kg/m. Physical Exam Constitutional:      General: He is not in acute distress.    Appearance: He is not diaphoretic.  HENT:     Head: Normocephalic and atraumatic.  Neck:     Thyroid: No thyromegaly.     Vascular: No JVD.     Trachea: No tracheal deviation.  Cardiovascular:     Rate and Rhythm: Normal rate and regular rhythm.     Heart sounds: No murmur heard.   Pulmonary:     Effort: Pulmonary effort is normal. No respiratory distress.     Breath sounds: Normal breath sounds. No wheezing.  Abdominal:  General: Bowel sounds are normal. There is no distension.     Palpations: Abdomen is soft.     Tenderness: There is no abdominal tenderness.  Musculoskeletal:        General: Tenderness (left gluteus maximus) and signs of injury present.     Right lower leg: No edema.     Left lower leg: Edema (+1 with no warmth, tenderness or redness) present.  Lymphadenopathy:     Cervical: No cervical adenopathy.  Skin:    General: Skin is warm and dry.     Findings: Bruising (left buttock) present.  Neurological:     General: No focal deficit present.     Mental Status: He is alert. Mental status is at baseline.     Labs reviewed: Recent Labs    01/23/20 0000 02/28/20 0000 03/01/20 0000  NA 142 139  --   K 4.0 4.2  --   CL 104 100  --   CO2 31* 32*  --   BUN 24*  --   --   CREATININE 1.1 1.3  --   CALCIUM 8.8  --  9.5   Recent Labs    01/23/20 0000  AST 27  ALT 23   Recent Labs     01/17/20 0000  HGB 13.1*  HCT 40*   No results found for: TSH Lab Results  Component Value Date   HGBA1C 6.7 01/17/2020   No results found for: CHOL, HDL, LDLCALC, LDLDIRECT, TRIG, CHOLHDL  Significant Diagnostic Results in last 30 days:  CT PELVIS WO CONTRAST  Result Date: 03/25/2020 CLINICAL DATA:  Fall with discoloration of the left hip. History of prostate cancer. EXAM: CT PELVIS WITHOUT CONTRAST TECHNIQUE: Multidetector CT imaging of the pelvis was performed following the standard protocol without intravenous contrast. COMPARISON:  None. FINDINGS: Urinary Tract: There appears to be a large, potentially mildly obstructing stone in the distal right ureter at the right UVJ measuring approximately 1.3 cm. The bladder is mildly distended. Bowel: There is a moderate amount of stool in the rectum. There are few scattered colonic diverticula without CT evidence for diverticulitis involving the visualized portions of the colon. The visualized appendix is unremarkable. Vascular/Lymphatic: There are atherosclerotic changes of the abdominal aorta and branch vasculature without evidence for an aneurysm involving the visualized portions. There are no definite pathologically enlarged lymph nodes. Reproductive:  Prostate gland is significantly enlarged. Other:  There is mild nonspecific body wall edema. Musculoskeletal: There is an intramuscular hematoma involving the left gluteus maximus muscle measuring approximately 6.3 cm. There is soft tissue edema overlying the left hip. There is no acute displaced fracture. There are degenerative changes of the visualized lower lumbar spine. IMPRESSION: 1. Intramuscular hematoma involving the left gluteus maximus muscle measuring approximately 6.3 cm. No acute displaced fracture. 2. There appears to be a large, potentially mildly obstructing stone in the distal right ureter at the right UVJ measuring approximately 1.3 cm. 3. There is soft tissue edema overlying the  left hip. 4. Prostate gland is significantly enlarged. Aortic Atherosclerosis (ICD10-I70.0). Electronically Signed   By: Katherine Mantlehristopher  Green M.D.   On: 03/25/2020 17:51    Assessment/Plan 1. Fall, subsequent encounter Unclear how this happened as it was self reported by the pt with dementia   2. Hematoma Noted on CT to the left gluteus maximus Continue Tylenol only for pain May use ice or heat for comfort Continue to monitor for resolution/absorption of the hematoma  3. Right renal stone Noted in the right  distal ureter UVJ 1.3 cm with mild obstruction  No current reports of back pain or fever Referral made to urology   4. Paroxysmal A-fib (HCC) Rate is controlled Will continue Eliquis at this time, if further bleeding issues are noted will need to consult with cardiology  5. Hypercoagulable state due to atrial fibrillation (HCC) Benefit of Eliquis outweighs the risk at this time.     Family/ staff Communication: discussed with his daughter Wallace Cullens ordered:  NA

## 2020-03-26 NOTE — Progress Notes (Signed)
I just got this result this morning.  I'm so leery about holding eliquis.  His daughter would have to be made aware of the stroke risks if we hold it and we don't want to hold it for long.

## 2020-03-30 ENCOUNTER — Non-Acute Institutional Stay (SKILLED_NURSING_FACILITY): Payer: Medicare HMO | Admitting: Adult Health

## 2020-03-30 ENCOUNTER — Encounter: Payer: Self-pay | Admitting: Adult Health

## 2020-03-30 DIAGNOSIS — R1310 Dysphagia, unspecified: Secondary | ICD-10-CM

## 2020-03-30 DIAGNOSIS — R059 Cough, unspecified: Secondary | ICD-10-CM

## 2020-03-30 NOTE — Progress Notes (Signed)
Location:  Medical illustrator of Service:  SNF (31) Provider:   Peggye Ley, ANP [facility] [phone]   James Balo, DO  Patient Care Team: James Balo, DO as PCP - General (Geriatric Medicine) Community, [facility] (Skilled Nursing Facility)  Extended Emergency Contact Information Primary Emergency Contact: James Greer Address: [address]          [place], [place] [address] James Greer Home Phone: [phone] Relation: Relative Secondary Emergency Contact: James Greer Address: [address]          [place], [place] [address] James Greer Mobile Phone: [phone] Relation: Son  Code Status:  DNR Goals of care: Advanced Directive information Advanced Directives 03/14/2020  Does Patient Have a Medical Advance Directive? Yes  Type of Advance Directive Out of facility DNR (pink MOST or yellow form);Living will;Healthcare Power of Attorney  Does patient want to make changes to medical advance directive? No - Patient declined  Copy of Healthcare Power of Attorney in Chart? Yes - validated most recent copy scanned in chart (See row information)  Pre-existing out of facility DNR order (yellow form or pink MOST form) Pink MOST/Yellow Form most recent copy in chart - Physician notified to receive inpatient order     Chief Complaint  Patient presents with  . Acute Visit    cough     HPI:  Pt is a 86 y.o. male seen today for an acute visit for cough. The nurse reports this morning he had a choking episode with food  and coughed loudly this morning. He also has some difficulty taking pills but this is not new. He typically takes his time and tilts his head back to swallow pills with minimal difficulty. After the choking episode he continued to have a non productive cough and wheezing. Lungs sounds were abnormal per nurse with bilateral wheeze. He has dementia and lives in the memory care  setting and needs assistance with meals and feeding. He has been working with ST regarding aphasia and cognition. Currently he is on a regular diet. His caregiver reports he has had pneumonia before.    Past Medical History:  Diagnosis Date  . Carpal tunnel syndrome   . Chronic atrial fibrillation, unspecified (HCC)   . Chronic kidney disease   . Chronic kidney disease, stage 3a (HCC)   . Degenerative disease of nervous system (HCC)   . Hyperlipidemia   . Localized edema   . Male erectile disorder   . Other thrombophilia (HCC)   . Personal history of other (healed) physical injury and trauma   . Rhinitis, allergic   . Trigger finger   . Type 2 diabetes mellitus (HCC)   . Unspecified convulsions (HCC)   . Unspecified dementia with behavioral disturbance (HCC)   . Urolithiasis    Past Surgical History:  Procedure Laterality Date  . CRANIOTOMY     with evacuation of subdural hematoma  . EXTRACORPOREAL SHOCK WAVE LITHOTRIPSY      No Known Allergies  Outpatient Encounter Medications as of 03/30/2020  Medication Sig  . acetaminophen (TYLENOL) 500 MG tablet Take 500 mg by mouth in the morning, at noon, and at bedtime.  Marland Kitchen apixaban (ELIQUIS) 2.5 MG TABS tablet Take 2.5 mg by mouth 2 (two) times daily.  Marland Kitchen atorvastatin (LIPITOR) 40 MG tablet Take 40 mg by mouth daily.  . digoxin (LANOXIN) 0.125 MG tablet Take 0.125 mg by mouth daily.  Marland Kitchen  furosemide (LASIX) 20 MG tablet Take 20 mg by mouth 2 (two) times daily.  . Glucerna (GLUCERNA) LIQD Take 237 mLs by mouth daily.  . Metoprolol Tartrate 75 MG TABS Take 75 mg by mouth. 3 tablets; oral Special instruction; administer 75 mg q12h.  every 12 hours 8:00pm 8:00am  . Multiple Vitamins-Minerals (MULTI-DAY PLUS MINERALS PO) Take 18 mg by mouth. Iron-400 mcg-25 mcg; amt: 1 tablet; oral. Once a morning  . POTASSIUM CHLORIDE PO Take 10 mEq by mouth daily.  . QUEtiapine (SEROQUEL) 50 MG tablet Take 50 mg by mouth at bedtime.  . traZODone (DESYREL)  50 MG tablet Take 50 mg by mouth at bedtime.   No facility-administered encounter medications on file as of 03/30/2020.    Review of Systems  Constitutional: Negative for activity change, appetite change, chills, diaphoresis, fatigue, fever and unexpected weight change.  HENT: Positive for trouble swallowing. Negative for congestion, rhinorrhea, sinus pressure, sinus pain, sneezing and sore throat.   Respiratory: Positive for cough, choking and wheezing. Negative for shortness of breath and stridor.   Gastrointestinal: Negative for abdominal distention, abdominal pain, constipation and diarrhea.  Genitourinary: Negative for difficulty urinating and dysuria.  Musculoskeletal:       Buttock pain  Neurological: Positive for speech difficulty.  Psychiatric/Behavioral: Positive for behavioral problems and confusion. Negative for agitation.    Immunization History  Administered Date(s) Administered  . Influenza-Unspecified 03/11/2011, 03/08/2012, 03/02/2013, 08/09/2013, 02/27/2016, 03/02/2017, 01/01/2019  . Moderna SARS-COVID-2 Vaccination 07/05/2019, 08/12/2019  . Pneumococcal Conjugate-13 06/13/2009  . Zoster 04/02/2010   Pertinent  Health Maintenance Due  Topic Date Due  . FOOT EXAM  Never done  . OPHTHALMOLOGY EXAM  Never done  . URINE MICROALBUMIN  Never done  . PNA vac Low Risk Adult (2 of 2 - PPSV23) 06/13/2010  . INFLUENZA VACCINE  01/01/2020  . HEMOGLOBIN A1C  07/19/2020   Fall Risk  03/17/2020 03/14/2020  Falls in the past year? 1 1  Number falls in past yr: 1 0  Injury with Fall? 1 1  Risk for fall due to : History of fall(s);Impaired balance/gait;Impaired mobility;Mental status change;Medication side effect -  Follow up Falls evaluation completed;Education provided;Falls prevention discussed;Follow up appointment -   Functional Status Survey:    Vitals:   03/30/20 1155  BP: 130/78  Pulse: 90  Resp: (!) 22  Temp: 98 F (36.7 C)  SpO2: 95%   There is no height  or weight on file to calculate BMI. Physical Exam Vitals and nursing note reviewed.  Constitutional:      General: He is not in acute distress.    Appearance: He is not diaphoretic.  HENT:     Head: Normocephalic and atraumatic.     Mouth/Throat:     Mouth: Mucous membranes are moist.     Pharynx: Oropharynx is clear.  Eyes:     Conjunctiva/sclera: Conjunctivae normal.     Pupils: Pupils are equal, round, and reactive to light.  Neck:     Thyroid: No thyromegaly.     Vascular: No JVD.     Trachea: No tracheal deviation.  Cardiovascular:     Rate and Rhythm: Normal rate. Rhythm irregular.     Heart sounds: No murmur heard.   Pulmonary:     Effort: Pulmonary effort is normal. No respiratory distress.     Breath sounds: Wheezing and rhonchi present.  Abdominal:     General: Bowel sounds are normal. There is no distension.     Palpations: Abdomen  is soft.     Tenderness: There is no abdominal tenderness.  Musculoskeletal:     Cervical back: No rigidity or tenderness.     Right lower leg: Edema present.     Left lower leg: Edema present.  Lymphadenopathy:     Cervical: No cervical adenopathy.  Skin:    General: Skin is warm and dry.  Neurological:     General: No focal deficit present.     Mental Status: He is alert. Mental status is at baseline.  Psychiatric:        Mood and Affect: Mood normal.     Labs reviewed: Recent Labs    01/23/20 0000 02/28/20 0000 03/01/20 0000  NA 142 139  --   K 4.0 4.2  --   CL 104 100  --   CO2 31* 32*  --   BUN 24*  --   --   CREATININE 1.1 1.3  --   CALCIUM 8.8  --  9.5   Recent Labs    01/23/20 0000  AST 27  ALT 23   Recent Labs    01/17/20 0000  HGB 13.1*  HCT 40*   No results found for: TSH Lab Results  Component Value Date   HGBA1C 6.7 01/17/2020   No results found for: CHOL, HDL, LDLCALC, LDLDIRECT, TRIG, CHOLHDL  Significant Diagnostic Results in last 30 days:  CT PELVIS WO CONTRAST  Result Date:  03/25/2020 CLINICAL DATA:  Fall with discoloration of the left hip. History of prostate cancer. EXAM: CT PELVIS WITHOUT CONTRAST TECHNIQUE: Multidetector CT imaging of the pelvis was performed following the standard protocol without intravenous contrast. COMPARISON:  None. FINDINGS: Urinary Tract: There appears to be a large, potentially mildly obstructing stone in the distal right ureter at the right UVJ measuring approximately 1.3 cm. The bladder is mildly distended. Bowel: There is a moderate amount of stool in the rectum. There are few scattered colonic diverticula without CT evidence for diverticulitis involving the visualized portions of the colon. The visualized appendix is unremarkable. Vascular/Lymphatic: There are atherosclerotic changes of the abdominal aorta and branch vasculature without evidence for an aneurysm involving the visualized portions. There are no definite pathologically enlarged lymph nodes. Reproductive:  Prostate gland is significantly enlarged. Other:  There is mild nonspecific body wall edema. Musculoskeletal: There is an intramuscular hematoma involving the left gluteus maximus muscle measuring approximately 6.3 cm. There is soft tissue edema overlying the left hip. There is no acute displaced fracture. There are degenerative changes of the visualized lower lumbar spine. IMPRESSION: 1. Intramuscular hematoma involving the left gluteus maximus muscle measuring approximately 6.3 cm. No acute displaced fracture. 2. There appears to be a large, potentially mildly obstructing stone in the distal right ureter at the right UVJ measuring approximately 1.3 cm. 3. There is soft tissue edema overlying the left hip. 4. Prostate gland is significantly enlarged. Aortic Atherosclerosis (ICD10-I70.0). Electronically Signed   By: Katherine Mantle M.D.   On: 03/25/2020 17:51    Assessment/Plan   1. Dysphagia, unspecified type Needs ST to eval and treat as indicated Nurse to use dissolvable  potassium and crush the vitamin tablet for ease of administration Needs to be upright at all times for meals with 1:1 supervision   2. Cough If covid swab negative may add Duoneb q 6 prn cough or wheeze Check CXR to eval for pna   Family/ staff Communication: discussed with his caregiver and his nurse   Labs/tests ordered:  CXR Covid swab

## 2020-04-04 ENCOUNTER — Encounter: Payer: Self-pay | Admitting: Internal Medicine

## 2020-04-04 ENCOUNTER — Non-Acute Institutional Stay (SKILLED_NURSING_FACILITY): Payer: Medicare HMO | Admitting: Internal Medicine

## 2020-04-04 DIAGNOSIS — T17908S Unspecified foreign body in respiratory tract, part unspecified causing other injury, sequela: Secondary | ICD-10-CM | POA: Diagnosis not present

## 2020-04-04 DIAGNOSIS — I872 Venous insufficiency (chronic) (peripheral): Secondary | ICD-10-CM | POA: Diagnosis not present

## 2020-04-04 DIAGNOSIS — R635 Abnormal weight gain: Secondary | ICD-10-CM

## 2020-04-04 NOTE — Progress Notes (Signed)
Location:  Medical illustrator of Service:  SNF (31) Provider:  Ambers Iyengar L. Renato Gails, D.O., C.M.D.  Kermit Balo, DO  Patient Care Team: Kermit Balo, DO as PCP - General (Geriatric Medicine) Community, [facility] (Skilled Nursing Facility)  Extended Emergency Contact Information Primary Emergency Contact: [name] Address: [address]          [place], [place] [address] Darden Amber of [place] Home Phone: [phone] Relation: Relative Secondary Emergency Contact: [name] Address: [address]          [place], [place] [address] Darden Amber of [place] Mobile Phone: [phone] Relation: Son  Code Status:  DNR Goals of care: Advanced Directive information Advanced Directives 04/04/2020  Does Patient Have a Medical Advance Directive? Yes  Type of Advance Directive Out of facility DNR (pink MOST or yellow form)  Does patient want to make changes to medical advance directive? No - Patient declined  Copy of Healthcare Power of Attorney in Chart? Yes - validated most recent copy scanned in chart (See row information)  Pre-existing out of facility DNR order (yellow form or pink MOST form) -   Chief Complaint  Patient presents with  . Acute Visit    Weight increase from 190.4 to 194.8 Swelling in both legs     HPI:  Pt is a 86 y.o. male seen today for an acute visit for weight gain over the past week.  He has increased pitting edema of bilateral legs and feet.  He still has not gotten the new compression socks discussed at the care plan meeting last week.  He's completing day 6/10 of doxycycline for aspiration pneumonia--he's also on mucinex and nebs.  He's had some wheezing noted by his daughter on the phone but this has been (per evening nurse) after he's been walking around a lot.  Currently, he is sitting with Nadine.  He seemed to be arguing with most things said to him, but did fine when I agreed with what he was saying and  asked other things.  His responses were not relevant to what was asked.  He has been evaluated by ST for dysphagia.  His covid test was negative.  Past Medical History:  Diagnosis Date  . Carpal tunnel syndrome   . Chronic atrial fibrillation, unspecified (HCC)   . Chronic kidney disease   . Chronic kidney disease, stage 3a (HCC)   . Degenerative disease of nervous system (HCC)   . Hyperlipidemia   . Localized edema   . Male erectile disorder   . Other thrombophilia (HCC)   . Personal history of other (healed) physical injury and trauma   . Rhinitis, allergic   . Trigger finger   . Type 2 diabetes mellitus (HCC)   . Unspecified convulsions (HCC)   . Unspecified dementia with behavioral disturbance (HCC)   . Urolithiasis    Past Surgical History:  Procedure Laterality Date  . CRANIOTOMY     with evacuation of subdural hematoma  . EXTRACORPOREAL SHOCK WAVE LITHOTRIPSY      No Known Allergies  Outpatient Encounter Medications as of 04/04/2020  Medication Sig  . acetaminophen (TYLENOL) 500 MG tablet Take 500 mg by mouth in the morning, at noon, and at bedtime.  Marland Kitchen apixaban (ELIQUIS) 2.5 MG TABS tablet Take 2.5 mg by mouth 2 (two) times daily.  Marland Kitchen atorvastatin (LIPITOR) 40 MG tablet Take 40 mg by mouth daily.  . digoxin (LANOXIN) 0.125 MG tablet Take  0.125 mg by mouth daily.  . furosemide (LASIX) 20 MG tablet Take 20 mg by mouth 2 (two) times daily.  . Glucerna (GLUCERNA) LIQD Take 237 mLs by mouth daily.  . Metoprolol Tartrate 75 MG TABS Take 75 mg by mouth. 3 tablets; oral Special instruction; administer 75 mg q12h.  every 12 hours 8:00pm 8:00am  . Multiple Vitamins-Minerals (MULTI-DAY PLUS MINERALS PO) Take 18 mg by mouth. Iron-400 mcg-25 mcg; amt: 1 tablet; oral. Once a morning  . polyethylene glycol (MIRALAX / GLYCOLAX) 17 g packet Take 17 g by mouth daily.  Marland Kitchen POTASSIUM CHLORIDE PO Take 10 mEq by mouth daily.  . QUEtiapine (SEROQUEL) 50 MG tablet Take 50 mg by mouth at bedtime.   . traZODone (DESYREL) 50 MG tablet Take 50 mg by mouth at bedtime.  Marland Kitchen doxycycline (VIBRA-TABS) 100 MG tablet Take 1 tablet (100 mg total) by mouth 2 (two) times daily for 10 days.   No facility-administered encounter medications on file as of 04/04/2020.    Review of Systems  Constitutional: Negative for chills, fever and malaise/fatigue.  HENT: Negative for congestion and sore throat.   Respiratory: Positive for wheezing. Negative for cough, sputum production and shortness of breath.   Cardiovascular: Positive for leg swelling. Negative for chest pain and palpitations.  Gastrointestinal: Negative for abdominal pain.  Genitourinary: Negative for dysuria.  Musculoskeletal: Negative for falls and joint pain.  Neurological: Negative for dizziness and loss of consciousness.  Psychiatric/Behavioral: Positive for memory loss.    Immunization History  Administered Date(s) Administered  . Influenza-Unspecified 03/11/2011, 03/08/2012, 03/02/2013, 08/09/2013, 02/27/2016, 03/02/2017, 01/01/2019  . Moderna SARS-COVID-2 Vaccination 07/05/2019, 08/12/2019  . Pneumococcal Conjugate-13 06/13/2009  . Zoster 04/02/2010   Pertinent  Health Maintenance Due  Topic Date Due  . FOOT EXAM  Never done  . OPHTHALMOLOGY EXAM  Never done  . URINE MICROALBUMIN  Never done  . PNA vac Low Risk Adult (2 of 2 - PPSV23) 06/13/2010  . INFLUENZA VACCINE  01/01/2020  . HEMOGLOBIN A1C  07/19/2020   Fall Risk  03/17/2020 03/14/2020  Falls in the past year? 1 1  Number falls in past yr: 1 0  Injury with Fall? 1 1  Risk for fall due to : History of fall(s);Impaired balance/gait;Impaired mobility;Mental status change;Medication side effect -  Follow up Falls evaluation completed;Education provided;Falls prevention discussed;Follow up appointment -   Functional Status Survey:    Vitals:   04/04/20 1638  BP: (!) 145/83  Pulse: 94  Resp: 16  Temp: (!) 97 F (36.1 C)  SpO2: 95%  Weight: 194 lb 12.8 oz (88.4  kg)   Body mass index is 25.7 kg/m. Physical Exam Vitals reviewed.  Constitutional:      General: He is not in acute distress.    Appearance: Normal appearance. He is not toxic-appearing.  HENT:     Head: Normocephalic and atraumatic.  Cardiovascular:     Rate and Rhythm: Normal rate and regular rhythm.     Pulses: Normal pulses.     Heart sounds: Normal heart sounds.  Pulmonary:     Effort: Pulmonary effort is normal.     Breath sounds: Normal breath sounds. No wheezing, rhonchi or rales.  Abdominal:     General: Bowel sounds are normal.  Musculoskeletal:        General: Normal range of motion.     Right lower leg: Edema present.     Left lower leg: Edema present.  Skin:    General: Skin is warm  and dry.  Neurological:     Mental Status: He is alert. Mental status is at baseline.     Comments: Ambulates w/o assistive device, moving faster now than just after his fall; edema of both legs 1+ with compression socks (athletic type, not yet the rx kind)     Labs reviewed: Recent Labs    01/23/20 0000 02/28/20 0000 03/01/20 0000  NA 142 139  --   K 4.0 4.2  --   CL 104 100  --   CO2 31* 32*  --   BUN 24*  --   --   CREATININE 1.1 1.3  --   CALCIUM 8.8  --  9.5   Recent Labs    01/23/20 0000  AST 27  ALT 23   Recent Labs    01/17/20 0000  HGB 13.1*  HCT 40*   No results found for: TSH Lab Results  Component Value Date   HGBA1C 6.7 01/17/2020   No results found for: CHOL, HDL, LDLCALC, LDLDIRECT, TRIG, CHOLHDL  Significant Diagnostic Results in last 30 days:  CT PELVIS WO CONTRAST  Result Date: 03/25/2020 CLINICAL DATA:  Fall with discoloration of the left hip. History of prostate cancer. EXAM: CT PELVIS WITHOUT CONTRAST TECHNIQUE: Multidetector CT imaging of the pelvis was performed following the standard protocol without intravenous contrast. COMPARISON:  None. FINDINGS: Urinary Tract: There appears to be a large, potentially mildly obstructing stone in  the distal right ureter at the right UVJ measuring approximately 1.3 cm. The bladder is mildly distended. Bowel: There is a moderate amount of stool in the rectum. There are few scattered colonic diverticula without CT evidence for diverticulitis involving the visualized portions of the colon. The visualized appendix is unremarkable. Vascular/Lymphatic: There are atherosclerotic changes of the abdominal aorta and branch vasculature without evidence for an aneurysm involving the visualized portions. There are no definite pathologically enlarged lymph nodes. Reproductive:  Prostate gland is significantly enlarged. Other:  There is mild nonspecific body wall edema. Musculoskeletal: There is an intramuscular hematoma involving the left gluteus maximus muscle measuring approximately 6.3 cm. There is soft tissue edema overlying the left hip. There is no acute displaced fracture. There are degenerative changes of the visualized lower lumbar spine. IMPRESSION: 1. Intramuscular hematoma involving the left gluteus maximus muscle measuring approximately 6.3 cm. No acute displaced fracture. 2. There appears to be a large, potentially mildly obstructing stone in the distal right ureter at the right UVJ measuring approximately 1.3 cm. 3. There is soft tissue edema overlying the left hip. 4. Prostate gland is significantly enlarged. Aortic Atherosclerosis (ICD10-I70.0). Electronically Signed   By: Katherine Mantle M.D.   On: 03/25/2020 17:51   Assessment/Plan 1. Weight gain Already to be getting new compression hose (discussed a couple of weeks ago after care plan) Double am lasix x3 days, keep noon dose the same, then return to original dose after 3 days  2. Chronic venous insufficiency -needs new compression hose as planned  3. Aspiration into airway, sequela Finish off course of doxy started after aspiration episode--lungs clear w/o wheezing today Cont ST as planned -aspiration precautions  Family/ staff  Communication: d/w Bosie Clos and Gloriajean Dell  Labs/tests ordered:  No new  Eryk Beavers L. Aiyana Stegmann, D.O. Geriatrics [facility] [address], [place] [address] Cell Phone (Mon-Fri 8am-5pm):  [phone] On Call:  [phone] & follow prompts after 5pm & weekends Office Phone:  [phone] Office Fax:  [phone]

## 2020-04-05 MED ORDER — DOXYCYCLINE HYCLATE 100 MG PO TABS: 100.0000 mg | ORAL_TABLET | Freq: Two times a day (BID) | ORAL | 0 refills | Status: AC

## 2020-04-06 ENCOUNTER — Encounter: Payer: Self-pay | Admitting: Internal Medicine

## 2020-05-07 ENCOUNTER — Non-Acute Institutional Stay (SKILLED_NURSING_FACILITY): Payer: Medicare HMO | Admitting: Adult Health

## 2020-05-07 DIAGNOSIS — R1310 Dysphagia, unspecified: Secondary | ICD-10-CM

## 2020-05-07 DIAGNOSIS — I509 Heart failure, unspecified: Secondary | ICD-10-CM

## 2020-05-07 DIAGNOSIS — N200 Calculus of kidney: Secondary | ICD-10-CM

## 2020-05-07 DIAGNOSIS — E1122 Type 2 diabetes mellitus with diabetic chronic kidney disease: Secondary | ICD-10-CM

## 2020-05-07 DIAGNOSIS — I4891 Unspecified atrial fibrillation: Secondary | ICD-10-CM

## 2020-05-07 DIAGNOSIS — N1831 Chronic kidney disease, stage 3a: Secondary | ICD-10-CM

## 2020-05-07 DIAGNOSIS — I480 Paroxysmal atrial fibrillation: Secondary | ICD-10-CM

## 2020-05-07 DIAGNOSIS — D6869 Other thrombophilia: Secondary | ICD-10-CM

## 2020-05-08 ENCOUNTER — Encounter: Payer: Self-pay | Admitting: Adult Health

## 2020-05-08 DIAGNOSIS — N183 Chronic kidney disease, stage 3 unspecified: Secondary | ICD-10-CM | POA: Insufficient documentation

## 2020-05-08 NOTE — Progress Notes (Signed)
Location:  Medical illustrator of Service:  SNF (31) Provider:   Peggye Ley, ANP [facility] [phone]   Kermit Balo, DO  Patient Care Team: Kermit Balo, DO as PCP - General (Geriatric Medicine) Community, [facility] (Skilled Nursing Facility)  Extended Emergency Contact Information Primary Emergency Contact: [name] Address: [address]          [place], [place] [address] Darden Amber of [place] Home Phone: [phone] Relation: Relative Secondary Emergency Contact: [name] Address: [address]          [place], [place] [address] Darden Amber of [place] Mobile Phone: [phone] Relation: Son  Code Status:  DNR Goals of care: Advanced Directive information Advanced Directives 04/04/2020  Does Patient Have a Medical Advance Directive? Yes  Type of Advance Directive Out of facility DNR (pink MOST or yellow form)  Does patient want to make changes to medical advance directive? No - Patient declined  Copy of Healthcare Power of Attorney in Chart? Yes - validated most recent copy scanned in chart (See row information)  Pre-existing out of facility DNR order (yellow form or pink MOST form) -     Chief Complaint  Patient presents with  . Medical Management of Chronic Issues    HPI:  Pt is a 86 y.o. male seen today for medical management of chronic diseases.    Mr. Mormile resides in  Memory care due to advancing Alz dementia. He tends to eat an adequate breakfast but as the day goes on his appetite tapers, eating less for lunch and dinner. He tends to refuse personal care at times and can become angry. There are no notes to support that he is hitting or a danger to himself or others. When asking his caregiver Gloriajean Dell she did not have any concerns other than that he had 1 occasion where she felt his hip was hurting.   He was treated for pna with doxycycline and CHF with lasix in November. His weight has  come down from 194 to 180 lbs and he is not having any issues with doe, pnd, or edema. No cough or congestion. He was seen by speech therapy for dysphagia.Marland Kitchen He is on a regular diet at this time.   He has a hx of nephrolithiasis and currently has a stone in the right distal ureter UVJ 1.3 cm with mild obstruction. Urology is requesting medical clearance to do lithotripsy (endo type procedure).   He is not currently having fever or pain although this is difficult to assess due to his advancing dementia. He can not complete an MMSE. Remains ambulatory and self toilets. Seroquel was increased on 11/11 to 75 mg per family request.  Past Medical History:  Diagnosis Date  . Carpal tunnel syndrome   . Chronic atrial fibrillation, unspecified (HCC)   . Chronic kidney disease   . Chronic kidney disease, stage 3a (HCC)   . Degenerative disease of nervous system (HCC)   . Hyperlipidemia   . Localized edema   . Male erectile disorder   . Other thrombophilia (HCC)   . Personal history of other (healed) physical injury and trauma   . Rhinitis, allergic   . Trigger finger   . Type 2 diabetes mellitus (HCC)   . Unspecified convulsions (HCC)   . Unspecified dementia with behavioral disturbance (HCC)   . Urolithiasis    Past Surgical History:  Procedure Laterality Date  . CRANIOTOMY  with evacuation of subdural hematoma  . EXTRACORPOREAL SHOCK WAVE LITHOTRIPSY      No Known Allergies  Outpatient Encounter Medications as of 05/07/2020  Medication Sig  . NON FORMULARY daily. 2 fiber thin crackers  . acetaminophen (TYLENOL) 500 MG tablet Take 500 mg by mouth in the morning, at noon, and at bedtime.  Marland Kitchen apixaban (ELIQUIS) 2.5 MG TABS tablet Take 2.5 mg by mouth 2 (two) times daily.  Marland Kitchen atorvastatin (LIPITOR) 40 MG tablet Take 40 mg by mouth daily.  . digoxin (LANOXIN) 0.125 MG tablet Take 0.125 mg by mouth daily.  . furosemide (LASIX) 20 MG tablet Take 20 mg by mouth 2 (two) times daily.  . Glucerna  (GLUCERNA) LIQD Take 237 mLs by mouth daily.  . Metoprolol Tartrate 75 MG TABS Take 75 mg by mouth. 3 tablets; oral Special instruction; administer 75 mg q12h.  every 12 hours 8:00pm 8:00am  . Multiple Vitamins-Minerals (MULTI-DAY PLUS MINERALS PO) Take 18 mg by mouth. Iron-400 mcg-25 mcg; amt: 1 tablet; oral. Once a morning  . POTASSIUM CHLORIDE PO Take 10 mEq by mouth daily.  . QUEtiapine (SEROQUEL) 50 MG tablet Take 75 mg by mouth at bedtime.   . traZODone (DESYREL) 50 MG tablet Take 50 mg by mouth at bedtime.  . [DISCONTINUED] polyethylene glycol (MIRALAX / GLYCOLAX) 17 g packet Take 17 g by mouth daily.   No facility-administered encounter medications on file as of 05/07/2020.    Review of Systems  Unable to perform ROS: Dementia    Immunization History  Administered Date(s) Administered  . Influenza-Unspecified 03/11/2011, 03/08/2012, 03/02/2013, 08/09/2013, 02/27/2016, 03/02/2017, 01/01/2019, 03/26/2020  . Moderna SARS-COVID-2 Vaccination 07/05/2019, 08/12/2019  . Pneumococcal Conjugate-13 06/13/2009  . Zoster 04/02/2010  . Zoster Recombinat (Shingrix) 05/03/2020   Pertinent  Health Maintenance Due  Topic Date Due  . FOOT EXAM  Never done  . PNA vac Low Risk Adult (2 of 2 - PPSV23) 06/13/2010  . URINE MICROALBUMIN  06/08/2020 (Originally 02/11/1944)  . HEMOGLOBIN A1C  07/19/2020  . OPHTHALMOLOGY EXAM  04/05/2021  . INFLUENZA VACCINE  Completed   Fall Risk  03/17/2020 03/14/2020  Falls in the past year? 1 1  Number falls in past yr: 1 0  Injury with Fall? 1 1  Risk for fall due to : History of fall(s);Impaired balance/gait;Impaired mobility;Mental status change;Medication side effect -  Follow up Falls evaluation completed;Education provided;Falls prevention discussed;Follow up appointment -   Functional Status Survey:    Vitals:   05/08/20 1017  Weight: 180 lb 3.2 oz (81.7 kg)   Body mass index is 23.77 kg/m. Physical Exam Vitals and nursing note reviewed.    Constitutional:      General: He is not in acute distress.    Appearance: He is not diaphoretic.  HENT:     Head: Normocephalic and atraumatic.  Neck:     Thyroid: No thyromegaly.     Vascular: No JVD.     Trachea: No tracheal deviation.  Cardiovascular:     Rate and Rhythm: Normal rate. Rhythm irregular.     Heart sounds: No murmur heard.   Pulmonary:     Effort: Pulmonary effort is normal. No respiratory distress.     Breath sounds: Normal breath sounds. No wheezing.     Comments: Would not take deep breaths Abdominal:     General: Bowel sounds are normal. There is no distension.     Palpations: Abdomen is soft.     Tenderness: There is no abdominal tenderness.  Musculoskeletal:     Comments: Trace edema to both ankles  Lymphadenopathy:     Cervical: No cervical adenopathy.  Skin:    General: Skin is warm and dry.  Neurological:     General: No focal deficit present.     Mental Status: He is alert. Mental status is at baseline.  Psychiatric:     Comments: irritable     Labs reviewed: Recent Labs    01/23/20 0000 02/28/20 0000 03/01/20 0000  NA 142 139  --   K 4.0 4.2  --   CL 104 100  --   CO2 31* 32*  --   BUN 24*  --   --   CREATININE 1.1 1.3  --   CALCIUM 8.8  --  9.5   Recent Labs    01/23/20 0000  AST 27  ALT 23   Recent Labs    01/17/20 0000  HGB 13.1*  HCT 40*   Lab Results  Component Value Date   TSH 2.36 01/17/2020   Lab Results  Component Value Date   HGBA1C 6.7 01/17/2020   No results found for: CHOL, HDL, LDLCALC, LDLDIRECT, TRIG, CHOLHDL  Significant Diagnostic Results in last 30 days:  No results found.  Assessment/Plan  1. Dysphagia, unspecified type Continue ST recommendations Monitor for s/s aspiration Upright with supervision for meals   2. Right renal stone Needs lithotripsy per urology No current acute symptoms Needs medical clearance for which we have recommended that he see cardiology due to his recent bout  of CHF   3. Chronic congestive heart failure, unspecified heart failure type (HCC) Appears compensated on lasix 20 mg bid   4. Paroxysmal A-fib (HCC) Rate remains controlled with Digoxin   5. Hypercoagulable state due to atrial fibrillation (HCC) Continues on Eliquis for CVA risk reduction  6. Type 2 diabetes mellitus with stage 3a chronic kidney disease, without long-term current use of insulin (HCC) Diet controlled Lab Results  Component Value Date   HGBA1C 6.7 01/17/2020   Goal <8% given his age and dementia  7. Stage 3a chronic kidney disease (HCC) Continue to periodically monitor BMP and avoid nephrotoxic agents   Family/ staff Communication: discussed with his nurse and his caregiver   Labs/tests ordered:  NA

## 2020-05-15 ENCOUNTER — Telehealth: Payer: Self-pay | Admitting: Cardiovascular Disease

## 2020-05-15 NOTE — Telephone Encounter (Signed)
Pt is establishing care with cardiology  - will see Dr. Tresa Endo on 05/17/20.  Dr. Tresa Endo, please provide recommendations for clearance and eliquis hold. Please let us know if you would like assistance faxing the clearance.   Prior cardiologist:  Deedra Ehrich, MD Phone [phone]  Pt apparently had a stress test this year.

## 2020-05-15 NOTE — Telephone Encounter (Signed)
   [facility] Pre-operative Risk Assessment    HEARTCARE STAFF: - Please ensure there is not already an duplicate clearance open for this procedure. - Under Visit Info/Reason for Call, type in Other and utilize the format Clearance MM/DD/YY or Clearance TBD. Do not use dashes or single digits. - If request is for dental extraction, please clarify the # of teeth to be extracted.  Request for surgical clearance:  1. What type of surgery is being performed? Cystoscopy Right Retrograde Hologram Right Ureteroscopy Lithotripsy and Stent Placement   2. When is this surgery scheduled? TBD    3. What type of clearance is required (medical clearance vs. Pharmacy clearance to hold med vs. Both)? Medical   4. Are there any medications that need to be held prior to surgery and how long? No   5. Practice name and name of physician performing surgery?[facility], Dr. Festus Aloe    6. What is the office phone number? [phone]   7.   What is the office fax number? [phone]  8.   Anesthesia type (None, local, MAC, general) ? General    Trilby Drummer 05/15/2020, 9:41 AM  _________________________________________________________________   (provider comments below)

## 2020-05-17 ENCOUNTER — Encounter: Payer: Self-pay | Admitting: Cardiovascular Disease

## 2020-05-17 ENCOUNTER — Other Ambulatory Visit: Payer: Self-pay

## 2020-05-17 ENCOUNTER — Ambulatory Visit (INDEPENDENT_AMBULATORY_CARE_PROVIDER_SITE_OTHER): Payer: Medicare HMO | Admitting: Cardiovascular Disease

## 2020-05-17 VITALS — BP 155/86 | HR 96 | Ht 75.0 in | Wt 179.4 lb

## 2020-05-17 DIAGNOSIS — I10 Essential (primary) hypertension: Secondary | ICD-10-CM | POA: Diagnosis not present

## 2020-05-17 DIAGNOSIS — I4811 Longstanding persistent atrial fibrillation: Secondary | ICD-10-CM | POA: Diagnosis not present

## 2020-05-17 DIAGNOSIS — Z7901 Long term (current) use of anticoagulants: Secondary | ICD-10-CM | POA: Diagnosis not present

## 2020-05-17 DIAGNOSIS — G301 Alzheimer's disease with late onset: Secondary | ICD-10-CM

## 2020-05-17 DIAGNOSIS — Z01818 Encounter for other preprocedural examination: Secondary | ICD-10-CM | POA: Diagnosis not present

## 2020-05-17 DIAGNOSIS — E785 Hyperlipidemia, unspecified: Secondary | ICD-10-CM

## 2020-05-17 DIAGNOSIS — M25473 Effusion, unspecified ankle: Secondary | ICD-10-CM

## 2020-05-17 DIAGNOSIS — F0280 Dementia in other diseases classified elsewhere without behavioral disturbance: Secondary | ICD-10-CM

## 2020-05-17 DIAGNOSIS — N200 Calculus of kidney: Secondary | ICD-10-CM

## 2020-05-17 DIAGNOSIS — N1831 Chronic kidney disease, stage 3a: Secondary | ICD-10-CM

## 2020-05-17 MED ORDER — DILTIAZEM HCL ER COATED BEADS 120 MG PO CP24: 120.0000 mg | ORAL_CAPSULE | Freq: Every day | ORAL | 3 refills | Status: DC

## 2020-05-17 NOTE — Progress Notes (Signed)
Cardiology Office Note    Date:  05/20/2020   ID:  [name], DOB 06/23/1933, MRN [id number]  PCP:  Kermit Balo, DO  Cardiologist:  Nicki Guadalajara, MD   New cardiology evaluation  History of Present Illness:  James Greer is a 86 y.o. male who recently moved to [place] after the death of his wife and is James Greer in the skilled nursing facility at [facility].  He is in the memory unit due to advancing Alzheimer's dementia.  He is referred for cardiology evaluation prior to undergoing need for lithotripsy.  He is referred for cardiology evaluation due to recent concerns for CHF.  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  He had been living in [place].  His wife died last year.  While in [place] he was seen by cardiologist.  He has a history of atrial fibrillation and has been on anticoagulation.  Most recently he has been on a regimen consisting of Eliquis 2.5 mg twice a day, digoxin 0.125 mg daily, furosemide 20 mg twice a day in addition to metoprolol tartrate 75 mg twice daily.  He is on Seroquel at bedtime as well as trazodone.  He has a history of hyperlipidemia and has been on atorvastatin.  He recently moved to the [facility].  He has a child living in [place].  He is now a resident of [facility] and is in skilled nursing in the memory unit.  He has had issues with nephrolithiasis and currently has a stone in his right distal ureter at 1.3 cm with mild obstruction.  Urology is requesting medical clearance to do lithotripsy.  He was recently seen by Fletcher Anon, NP from [facility].  He presents for cardiology evaluation.  Presently he denies any chest pain.  He does note that his heart rate increases with activity.  He had recently fallen in his apartment due to balance issues.  He has noted some ankle swelling left greater than right.  He denies any PND orthopnea.  He presents for evaluation and is here  with his caregiver from [place], [place], who also takes care of him at [facility].   Past Medical History:  Diagnosis Date  . Carpal tunnel syndrome   . Chronic atrial fibrillation, unspecified (HCC)   . Chronic kidney disease   . Chronic kidney disease, stage 3a (HCC)   . Degenerative disease of nervous system (HCC)   . Hyperlipidemia   . Localized edema   . Male erectile disorder   . Other thrombophilia (HCC)   . Personal history of other (healed) physical injury and trauma   . Rhinitis, allergic   . Trigger finger   . Type 2 diabetes mellitus (HCC)   . Unspecified convulsions (HCC)   . Unspecified dementia with behavioral disturbance (HCC)   . Urolithiasis     Past Surgical History:  Procedure Laterality Date  . CRANIOTOMY     with evacuation of subdural hematoma  . EXTRACORPOREAL SHOCK WAVE LITHOTRIPSY      Current Medications: Outpatient Medications Prior to Visit  Medication Sig Dispense Refill  . acetaminophen (TYLENOL) 500 MG tablet Take 500 mg by mouth in the morning, at noon, and at bedtime.    Marland Kitchen apixaban (ELIQUIS) 2.5 MG TABS tablet Take 2.5 mg by mouth 2 (two) times daily.    Marland Kitchen atorvastatin (LIPITOR) 40 MG tablet Take 40 mg by mouth daily.    . digoxin (LANOXIN) 0.125 MG tablet Take  0.125 mg by mouth daily.    . furosemide (LASIX) 20 MG tablet Take 20 mg by mouth 2 (two) times daily.    . Glucerna (GLUCERNA) LIQD Take 237 mLs by mouth daily.    . Metoprolol Tartrate 75 MG TABS Take 75 mg by mouth. 3 tablets; oral Special instruction; administer 75 mg q12h.  every 12 hours 8:00pm 8:00am    . Multiple Vitamins-Minerals (MULTI-DAY PLUS MINERALS PO) Take 18 mg by mouth. Iron-400 mcg-25 mcg; amt: 1 tablet; oral. Once a morning    . NON FORMULARY daily. 2 fiber thin crackers    . POTASSIUM CHLORIDE PO Take 10 mEq by mouth daily.    . QUEtiapine (SEROQUEL) 50 MG tablet Take 75 mg by mouth at bedtime.     . traZODone (DESYREL) 50 MG tablet Take 50 mg by mouth at  bedtime.     No facility-administered medications prior to visit.     Allergies:   Patient has no known allergies.   Social History   Socioeconomic History  . Marital status: Widowed    Spouse name: Not on file  . Number of children: 2  . Years of education: Not on file  . Highest education level: Not on file  Occupational History  . Not on file  Tobacco Use  . Smoking status: Never Smoker  . Smokeless tobacco: Never Used  Substance and Sexual Activity  . Alcohol use: Not Currently  . Drug use: Not Currently  . Sexual activity: Not Currently  Other Topics Concern  . Not on file  Social History Narrative  . Not on file   Social Determinants of Health   Financial Resource Strain: Not on file  Food Insecurity: Not on file  Transportation Needs: Not on file  Physical Activity: Not on file  Stress: Not on file  Social Connections: Not on file    Socially he was born [place].  He is a retired Technical sales engineerarchitect.  He was married for many years unfortunately his wife died in February 2021.  He moved up to [facility] with his caregiver from [place] who is here with him in the office today..  Family History:  The patient's family history includes Aneurysm in his sister.  Both parents are deceased.  He has 2 children 1 child is in [place].  ROS General: Negative; No fevers, chills, or night sweats;  HEENT: Hard of hearing, no sinus congestion, difficulty swallowing Pulmonary: Negative; No cough, wheezing, shortness of breath, hemoptysis Cardiovascular: No chest pain, bilateral ankle swelling, mild shortness of breath.  History of atrial fibrillation on anticoagulation GI: Negative; No nausea, vomiting, diarrhea, or abdominal pain GU: Nephrolithiasis with urethral stone Musculoskeletal: Negative; no myalgias, joint pain, or weakness Hematologic/Oncology: Negative; no easy bruising, bleeding Endocrine: Negative; no heat/cold intolerance; no diabetes Neuro: Progressive  dementia Skin: Negative; No rashes or skin lesions Psychiatric: Negative; No behavioral problems, depression Sleep: Negative; No snoring, daytime sleepiness, hypersomnolence, bruxism, restless legs, hypnogognic hallucinations, no cataplexy Other comprehensive 14 point system review is negative.   PHYSICAL EXAM:   VS:  BP (!) 155/86   Pulse 96   Ht 6\' 3"  (1.905 m)   Wt 179 lb 6.4 oz (81.4 kg)   SpO2 97%   BMI 22.42 kg/m     Repeat blood pressure by me 146/84  Wt Readings from Last 3 Encounters:  05/17/20 179 lb 6.4 oz (81.4 kg)  05/08/20 180 lb 3.2 oz (81.7 kg)  04/04/20 194 lb 12.8 oz (  88.4 kg)    General: Alert, oriented, no distress.  Skin: normal turgor, no rashes, warm and dry HEENT: Normocephalic, atraumatic. Pupils equal round and reactive to light; sclera anicteric; extraocular muscles intact;  Nose without nasal septal hypertrophy Mouth/Parynx benign; Mallinpatti scale 3 Neck: No JVD, no carotid bruits; normal carotid upstroke Lungs: clear to ausculatation and percussion; no wheezing or rales Chest wall: without tenderness to palpitation Heart: PMI not displaced, irregular regular with heart rate in the 90s, s1 s2 normal, 1/6 systolic murmur, no diastolic murmur, no S3 gallop, rubs, , thrills, or heaves Abdomen: soft, nontender; no hepatosplenomehaly, BS+; abdominal aorta nontender and not dilated by palpation. Back: no CVA tenderness Pulses 2+ Musculoskeletal: full range of motion, normal strength, no joint deformities Extremities: 1+ bilateral ankle swelling left greater than right no clubbing cyanosis, Homan's sign negative  Neurologic: grossly nonfocal; Cranial nerves grossly wnl Psychologic: Pleasant affect, difficult to recall   Studies/Labs Reviewed:   EKG:  EKG is  ordered today.  ECG (independently read by me): Atrial fibrillation at 96, RBBB, mild T wave abnormality   inferiorly   Recent Labs: BMP Latest Ref Rng & Units 03/01/2020 02/28/2020 01/23/2020   BUN 4 - 21 - - 24(A)  Creatinine 0.6 - 1.3 - 1.3 1.1  Sodium 137 - 147 - 139 142  Potassium 3.4 - 5.3 - 4.2 4.0  Chloride 99 - 108 - 100 104  CO2 13 - 22 - 32(A) 31(A)  Calcium 8.7 - 10.7 9.5 - 8.8     Hepatic Function Latest Ref Rng & Units 01/23/2020  AST 14 - 40 27  ALT 10 - 40 23    CBC Latest Ref Rng & Units 01/17/2020  Hemoglobin 13.5 - 17.5 13.1(A)  Hematocrit 41 - 53 40(A)   No results found for: MCV Lab Results  Component Value Date   TSH 2.36 01/17/2020   Lab Results  Component Value Date   HGBA1C 6.7 01/17/2020     BNP No results found for: BNP  ProBNP No results found for: PROBNP   Lipid Panel  No results found for: CHOL, TRIG, HDL, CHOLHDL, VLDL, LDLCALC, LDLDIRECT, LABVLDL   RADIOLOGY: No results found.   Additional studies/ records that were reviewed today include:  I reviewed the records from Dr. Bufford Spikes  ASSESSMENT:    1. Pre-operative clearance   2. Longstanding persistent atrial fibrillation (HCC)   3. Essential hypertension   4. Anticoagulated   5. Ankle swelling, unspecified laterality   6. Late onset Alzheimer's dementia without behavioral disturbance (HCC)   7. Nephrolithiasis   8. Stage 3a chronic kidney disease (HCC)   9. Dyslipidemia     PLAN:  James Greer is a very pleasant 86 year old gentleman who is originally from [place] and is a retired Technical sales engineer.  He had been living in [place] for many years and had been followed by cardiologist in [place].  He has a history of atrial fibrillation and has been on low-dose Eliquis.  He has been on digoxin and metoprolol for rate control.  He has had some issues with lower extremity edema and shortness of breath and is on furosemide 20 mg twice a day.  His ECG today shows atrial fibrillation with a ventricular rate in the mid 90s.  His blood pressure is elevated.  Presently I have recommended initiation of Cardizem CD 120 mg daily which would be helpful both for blood  pressure and additional rate control.  I am scheduling him for  2D echo Doppler study to assess systolic and diastolic function.  Clinically, he otherwise appears stable to undergo planned lithotripsy.  He may need to hold Eliquis for 36 to 48 hours prior to the procedure and once complete would need reinstitution the following day if stable.  He is on atorvastatin 40 mg for hyperlipidemia.  We will try to schedule him to see one of our APP's in approximately 2 to 3 weeks in follow-up of his echo Doppler study.   Medication Adjustments/Labs and Tests Ordered: Current medicines are reviewed at length with the patient today.  Concerns regarding medicines are outlined above.  Medication changes, Labs and Tests ordered today are listed in the Patient Instructions below. Patient Instructions  Medication Instructions:  BEGIN cardizem CD 120 mg by mouth once a day at bedtime *If you need a refill on your cardiac medications before your next appointment, please call your pharmacy*   Lab Work: None ordered If you have labs (blood work) drawn today and your tests are completely normal, you will receive your results only by: Marland Kitchen MyChart Message (if you have MyChart) OR . A paper copy in the mail If you have any lab test that is abnormal or we need to change your treatment, we will call you to review the results.   Testing/Procedures: Your physician has requested that you have an echocardiogram. Echocardiography is a painless test that uses sound waves to create images of your heart. It provides your doctor with information about the size and shape of your heart and how well your heart's chambers and valves are working. This procedure takes approximately one hour. There are no restrictions for this procedure.     Follow-Up: At [facility], you and your health needs are our priority.  As part of our continuing mission to provide you with exceptional heart care, we have created designated Provider Care  Teams.  These Care Teams include your primary Cardiologist (physician) and Advanced Practice Providers (APPs -  Physician Assistants and Nurse Practitioners) who all work together to provide you with the care you need, when you need it.  We recommend signing up for the patient portal called "MyChart".  Sign up information is provided on this After Visit Summary.  MyChart is used to connect with patients for Virtual Visits (Telemedicine).  Patients are able to view lab/test results, encounter notes, upcoming appointments, etc.  Non-urgent messages can be sent to your provider as well.   To learn more about what you can do with MyChart, go to [email or web address].    Your next appointment:   4 week(s)  The format for your next appointment:   In Person  Provider:   You will see one of the following Advanced Practice Providers on your designated Care Team:    Azalee Course, PA-C  Micah Flesher, [place] or   Judy Pimple, [place]      Other Instructions We will address your request for surgical clearance at your follow up appointment based on your test results.     Signed, Nicki Guadalajara, MD  05/20/2020 5:23 PM    [facility] [address], [address], [place], [place]  [address] Phone: [phone]

## 2020-05-17 NOTE — Patient Instructions (Signed)
Medication Instructions:  BEGIN cardizem CD 120 mg by mouth once a day at bedtime *If you need a refill on your cardiac medications before your next appointment, please call your pharmacy*   Lab Work: None ordered If you have labs (blood work) drawn today and your tests are completely normal, you will receive your results only by: Marland Kitchen MyChart Message (if you have MyChart) OR . A paper copy in the mail If you have any lab test that is abnormal or we need to change your treatment, we will call you to review the results.   Testing/Procedures: Your physician has requested that you have an echocardiogram. Echocardiography is a painless test that uses sound waves to create images of your heart. It provides your doctor with information about the size and shape of your heart and how well your heart's chambers and valves are working. This procedure takes approximately one hour. There are no restrictions for this procedure.     Follow-Up: At [facility], you and your health needs are our priority.  As part of our continuing mission to provide you with exceptional heart care, we have created designated Provider Care Teams.  These Care Teams include your primary Cardiologist (physician) and Advanced Practice Providers (APPs -  Physician Assistants and Nurse Practitioners) who all work together to provide you with the care you need, when you need it.  We recommend signing up for the patient portal called "MyChart".  Sign up information is provided on this After Visit Summary.  MyChart is used to connect with patients for Virtual Visits (Telemedicine).  Patients are able to view lab/test results, encounter notes, upcoming appointments, etc.  Non-urgent messages can be sent to your provider as well.   To learn more about what you can do with MyChart, go to [email or web address].    Your next appointment:   4 week(s)  The format for your next appointment:   In Person  Provider:   You will see one  of the following Advanced Practice Providers on your designated Care Team:    Azalee Course, PA-C  Micah Flesher, [place] or   Judy Pimple, [place]      Other Instructions We will address your request for surgical clearance at your follow up appointment based on your test results.

## 2020-05-20 ENCOUNTER — Encounter: Payer: Self-pay | Admitting: Cardiovascular Disease

## 2020-05-21 ENCOUNTER — Other Ambulatory Visit: Payer: Self-pay | Admitting: Urology

## 2020-05-21 NOTE — Telephone Encounter (Signed)
Patient's daughter in law, Annice Pih, is calling to follow up regarding clearance. She states she would like a call back to discuss the status of the clearance request and to review appointment notes from 05/17/20 appointment with Dr. Tresa Endo. Please return call to discuss further.

## 2020-05-21 NOTE — Telephone Encounter (Signed)
   Primary Cardiologist: Nicki Guadalajara, MD  Chart reviewed as part of pre-operative protocol coverage. Given past medical history and time since last visit, based on ACC/AHA guidelines, James Greer would be at acceptable risk for the planned procedure without further cardiovascular testing.   His Eliquis may be held for 36-48 hours prior to procedure.  Please resume as soon as hemostasis is achieved.  I will route this recommendation to the requesting party via Epic fax function and remove from pre-op pool.  Please call with questions.   Thomasene Ripple. Suzette Flagler NP-C    05/21/2020, 11:47 AM [facility] [address] Office [phone] Fax [phone]

## 2020-05-21 NOTE — Telephone Encounter (Addendum)
Tried to call patient's daughter-in-law Annice Pih, phone just keeps ringing, no VM will have to try again later  ECHO is scheduled for 12-21@930am 

## 2020-05-21 NOTE — Telephone Encounter (Signed)
Forwarded to requesting providers office via Epic fax function 

## 2020-05-22 ENCOUNTER — Telehealth: Payer: Self-pay

## 2020-05-22 ENCOUNTER — Other Ambulatory Visit: Payer: Self-pay

## 2020-05-22 ENCOUNTER — Ambulatory Visit (HOSPITAL_COMMUNITY): Payer: Medicare HMO | Attending: Cardiovascular Disease

## 2020-05-22 DIAGNOSIS — I313 Pericardial effusion (noninflammatory): Secondary | ICD-10-CM | POA: Insufficient documentation

## 2020-05-22 DIAGNOSIS — E785 Hyperlipidemia, unspecified: Secondary | ICD-10-CM | POA: Diagnosis not present

## 2020-05-22 DIAGNOSIS — I4891 Unspecified atrial fibrillation: Secondary | ICD-10-CM | POA: Insufficient documentation

## 2020-05-22 DIAGNOSIS — E1122 Type 2 diabetes mellitus with diabetic chronic kidney disease: Secondary | ICD-10-CM | POA: Insufficient documentation

## 2020-05-22 DIAGNOSIS — I517 Cardiomegaly: Secondary | ICD-10-CM | POA: Insufficient documentation

## 2020-05-22 DIAGNOSIS — Z01810 Encounter for preprocedural cardiovascular examination: Secondary | ICD-10-CM | POA: Insufficient documentation

## 2020-05-22 DIAGNOSIS — N189 Chronic kidney disease, unspecified: Secondary | ICD-10-CM | POA: Diagnosis not present

## 2020-05-22 DIAGNOSIS — Z01818 Encounter for other preprocedural examination: Secondary | ICD-10-CM

## 2020-05-22 LAB — ECHOCARDIOGRAM COMPLETE
Area-P 1/2: 4.67 cm2
S' Lateral: 2.1 cm

## 2020-05-22 NOTE — Telephone Encounter (Signed)
Left message for James Greer to advise of appointment for Echo @930  Today

## 2020-05-23 NOTE — Progress Notes (Signed)
COVID Vaccine Completed: Yes Date COVID Vaccine completed: 07/05/2019, 08/12/2019 COVID vaccine manufacturer:     Moderna     PCP - Kermit Balo, DO last note 04/04/20 in epic Cardiologist - Nicki Guadalajara, MD pre op clearance 05/17/20 in epic  Chest x-ray - 02/09/20 in epic EKG - 05/17/20 IN EPIC Stress Test -  ECHO - 05/22/20 in epic Cardiac Cath -  Pacemaker/ICD device last checked:  Sleep Study -  CPAP -   Fasting Blood Sugar -  Checks Blood Sugar _____ times a day   Blood Thinner Instructions: Aspirin Instructions: Last Dose:  Activity level:  Unable to go up a flight of stairs without symptoms   Can go up a flight of stairs without stopping and without symptoms   Able to exercise without symptoms     Anesthesia review: AFIB, DM, CKD,   Patient denies shortness of breath, fever, cough and chest pain at PAT appointment   Patient verbalized understanding of instructions that were given to them at the PAT appointment. Patient was also instructed that they will need to review over the PAT instructions again at home before surgery.

## 2020-05-23 NOTE — Patient Instructions (Addendum)
Preop instructions for:  James Greer    Date of Birth:  [birth year]                    Date of Procedure:  06/05/2020   Procedure: CYSTOSCOPY/RETROGRADE/URETEROSCOPY/HOLMIUM LASER/STENT PLACEMENT    Surgeon: Dr. Jerilee Field   Facility contact:  [facility]     Phone:                 Health Care POA: RN contact name/phone#:                          and Fax #:    Transportation contact phone#:     Time to arrive at [facility]: 10:00AM   Report to: Admitting (On your left hand side)    Do not eat midnight the night before your procedure.(To include any tube feedings-must be discontinued)  May have liquids until 10:00AM day of surgery  CLEAR LIQUID DIET  Foods Allowed                                                                     Foods Excluded  Water, Black Coffee and tea, regular and decaf                             liquids that you cannot  Plain Jell-O in any flavor  (No red)                                           see through such as: Fruit ices (not with fruit pulp)                                     milk, soups, orange juice  Iced Popsicles (No red)                                    All solid food                                   Apple juices Sports drinks like Gatorade (No red) Lightly seasoned clear broth or consume(fat free) Sugar, honey syrup  Sample Menu Breakfast                                Lunch                                     Supper Cranberry juice                    Beef broth  Chicken broth Jell-O                                     Grape juice                           Apple juice Coffee or tea                        Jell-O                                      Popsicle                                                Coffee or tea                        Coffee or tea   Take these morning medications only with sips of water.(or give through gastrostomy or feeding tube). Atorvastatin, Digoxin,  Diltiazem, Metoprolol   Note: No Insulin or Diabetic meds should be given or taken the morning of the procedure!     Please send day of procedure:current med list and meds last taken that day, confirm nothing by mouth status from what time, Patient Demographic info( to include DNR status, problem list, allergies)   Bring Insurance card and picture ID Leave all jewelry and other valuables at place where living( no metal or rings to be worn) No contact lens  Men-no colognes,lotions   Any questions day of procedure,call  SHORT STAY-[phone]     Sent from :[facility]                   Phone:[phone]                   Fax:[phone]   Sent by :   Rockwell Alexandria BSN, RN

## 2020-05-27 ENCOUNTER — Other Ambulatory Visit: Payer: Self-pay

## 2020-05-27 ENCOUNTER — Emergency Department (HOSPITAL_COMMUNITY)
Admission: EM | Admit: 2020-05-27 | Discharge: 2020-05-27 | Disposition: A | Discharge status: Home / Self Care | Payer: Medicare HMO | Attending: Emergency Medicine | Admitting: Emergency Medicine

## 2020-05-27 ENCOUNTER — Emergency Department (HOSPITAL_COMMUNITY): Payer: Medicare HMO

## 2020-05-27 DIAGNOSIS — F0390 Unspecified dementia without behavioral disturbance: Secondary | ICD-10-CM | POA: Insufficient documentation

## 2020-05-27 DIAGNOSIS — J918 Pleural effusion in other conditions classified elsewhere: Secondary | ICD-10-CM | POA: Diagnosis not present

## 2020-05-27 DIAGNOSIS — Z7901 Long term (current) use of anticoagulants: Secondary | ICD-10-CM | POA: Diagnosis not present

## 2020-05-27 DIAGNOSIS — S61412A Laceration without foreign body of left hand, initial encounter: Secondary | ICD-10-CM | POA: Diagnosis not present

## 2020-05-27 DIAGNOSIS — S0181XA Laceration without foreign body of other part of head, initial encounter: Secondary | ICD-10-CM

## 2020-05-27 DIAGNOSIS — S0990XA Unspecified injury of head, initial encounter: Secondary | ICD-10-CM | POA: Diagnosis present

## 2020-05-27 DIAGNOSIS — S92912A Unspecified fracture of left toe(s), initial encounter for closed fracture: Secondary | ICD-10-CM

## 2020-05-27 DIAGNOSIS — Y92129 Unspecified place in nursing home as the place of occurrence of the external cause: Secondary | ICD-10-CM | POA: Insufficient documentation

## 2020-05-27 DIAGNOSIS — W19XXXA Unspecified fall, initial encounter: Secondary | ICD-10-CM | POA: Diagnosis not present

## 2020-05-27 DIAGNOSIS — I482 Chronic atrial fibrillation, unspecified: Secondary | ICD-10-CM | POA: Diagnosis not present

## 2020-05-27 DIAGNOSIS — S92412A Displaced fracture of proximal phalanx of left great toe, initial encounter for closed fracture: Secondary | ICD-10-CM | POA: Diagnosis not present

## 2020-05-27 DIAGNOSIS — T1490XA Injury, unspecified, initial encounter: Secondary | ICD-10-CM

## 2020-05-27 HISTORY — DX: Unspecified fracture of left toe(s), initial encounter for closed fracture: S92.912A

## 2020-05-27 MED ORDER — METOPROLOL TARTRATE 25 MG PO TABS: 50.0000 mg | ORAL_TABLET | Freq: Two times a day (BID) | ORAL | 0 refills | Status: DC

## 2020-05-27 NOTE — Discharge Instructions (Addendum)
Your heart rate was very slow today.  We need to adjust the medications which are controlling your heart rate.  Reduce metoprolol to two tablets (50 mg) twice a day.  Use the postop shoe when walking.  You will need to follow-up with the orthopedic physician.  Tissue adhesive will fall off in about 1 week.  No special care is needed for the lacerations.

## 2020-05-27 NOTE — ED Provider Notes (Signed)
[facility] EMERGENCY DEPARTMENT Provider Note   CSN: [id number] Arrival date & time: 05/27/20  0158   History Chief Complaint  Patient presents with  . Trauma    James Greer is a 86 y.o. male.  The history is provided by the EMS personnel. The history is limited by the condition of the patient (Dementia).  He has history of dementia and was brought in by EMS as a level 2 trauma.  He had an unwitnessed fall at the facility where he lives and was found on the floor in the bathroom.  He was noted to have a laceration of the right forehead and also a skin tear of his left hand and he was complaining of pain in his left foot.  Patient is not able to give any reliable history.  EMS did notice episodes of significant bradycardia.  No past medical history on file.  There are no problems to display for this patient.   ** The histories are not reviewed yet. Pleasereview them in the "History" navigator section and refresh this SmartLink.     No family history on file.  Home Medications Prior to Admission medications   Not on File    Allergies    Patient has no allergy information on record.  Review of Systems   Review of Systems  Unable to perform ROS: Dementia    Physical Exam Updated Vital Signs BP 122/70   Pulse (!) 37   Temp 98 F (36.7 C) (Temporal)   Resp 15   Ht 6\' 2"  (1.88 m)   Wt 81.6 kg   SpO2 98%   BMI 23.11 kg/m   Physical Exam Vitals and nursing note reviewed.   86 year old male, resting comfortably and in no acute distress. Vital signs are significant for slow heart rate. Oxygen saturation is 98%, which is normal. Head is normocephalic.  Laceration is present on the right side of the forehead. PERRLA, EOMI. Oropharynx is clear. Neck is immobilized in a stiff cervical collar and is nontender without adenopathy or JVD. Back is nontender and there is no CVA tenderness. Lungs are clear without rales, wheezes, or rhonchi. Chest is  nontender. Heart has regular rate and rhythm without murmur. Abdomen is soft, flat, nontender without masses or hepatosplenomegaly and peristalsis is normoactive. Extremities have trace edema, full range of motion is present.  Laceration is present on the dorsum of the left hand.  No swelling or deformity are noted of the toes of left foot and there is no obvious tenderness to palpation. Skin is warm and dry without rash. Neurologic: Awake and alert, oriented to person but not place or time, cranial nerves are intact, moves all extremities equally.   ED Results / Procedures / Treatments    EKG EKG Interpretation  Date/Time:  Sunday May 27 2020 02:39:34 EST Ventricular Rate:  38 PR Interval:    QRS Duration: 148 QT Interval:  532 QTC Calculation: 423 R Axis:   133 Text Interpretation: duplicate, discard Confirmed by [id number] (Dione Booze) on 05/27/2020 2:45:16 AM   Radiology CT HEAD WO CONTRAST  Result Date: 05/27/2020 CLINICAL DATA:  Facial trauma. EXAM: CT HEAD WITHOUT CONTRAST CT CERVICAL SPINE WITHOUT CONTRAST TECHNIQUE: Multidetector CT imaging of the head and cervical spine was performed following the standard protocol without intravenous contrast. Multiplanar CT image reconstructions of the cervical spine were also generated. COMPARISON:  None. FINDINGS: CT HEAD FINDINGS Brain: There is no evidence of an acute infarct, intracranial hemorrhage, mass,  midline shift, or extra-axial fluid collection. There is encephalomalacia in both temporal lobes. There is mild-to-moderate cerebral atrophy. Hypodensities in the cerebral white matter bilaterally are nonspecific but compatible with mild chronic small vessel ischemic disease. Vascular: Calcified atherosclerosis at the skull base. No hyperdense vessel. Skull: Right parietal craniotomy.  No fracture. Sinuses/Orbits: Prior functional endoscopic sinus surgery with complete opacification of the left frontal sinus and scattered polypoid  mucosal thickening in the ethmoid sinuses, left maxillary sinus, and both nasal cavities suggesting polyps. Clear mastoid air cells. Bilateral cataract extraction. Other: None. CT CERVICAL SPINE FINDINGS Alignment: Trace retrolisthesis of C4 on C5. Skull base and vertebrae: No acute fracture or suspicious osseous lesion. Soft tissues and spinal canal: No prevertebral fluid or swelling. No visible canal hematoma. Disc levels: Moderate disc space narrowing from C3-4 to C5-6. Moderate multilevel facet arthrosis. Severe bilateral neural foraminal stenosis at C4-5 and moderate to severe bilateral neural foraminal stenosis at C5-6 due to uncovertebral spurring. Upper chest: Partially visualized large right pleural effusion. Other: Mild-to-moderate calcific atherosclerosis involving the carotid arteries. IMPRESSION: 1. No evidence of acute intracranial abnormality. 2. Bilateral temporal lobe encephalomalacia suggesting remote trauma. 3. Mild chronic small vessel ischemic disease. 4. No acute cervical spine fracture. 5. Partially visualized large right pleural effusion. A chest radiograph is pending. Electronically Signed   By: Sebastian Ache M.D.   On: 05/27/2020 02:42   CT CERVICAL SPINE WO CONTRAST  Result Date: 05/27/2020 CLINICAL DATA:  Facial trauma. EXAM: CT HEAD WITHOUT CONTRAST CT CERVICAL SPINE WITHOUT CONTRAST TECHNIQUE: Multidetector CT imaging of the head and cervical spine was performed following the standard protocol without intravenous contrast. Multiplanar CT image reconstructions of the cervical spine were also generated. COMPARISON:  None. FINDINGS: CT HEAD FINDINGS Brain: There is no evidence of an acute infarct, intracranial hemorrhage, mass, midline shift, or extra-axial fluid collection. There is encephalomalacia in both temporal lobes. There is mild-to-moderate cerebral atrophy. Hypodensities in the cerebral white matter bilaterally are nonspecific but compatible with mild chronic small vessel  ischemic disease. Vascular: Calcified atherosclerosis at the skull base. No hyperdense vessel. Skull: Right parietal craniotomy.  No fracture. Sinuses/Orbits: Prior functional endoscopic sinus surgery with complete opacification of the left frontal sinus and scattered polypoid mucosal thickening in the ethmoid sinuses, left maxillary sinus, and both nasal cavities suggesting polyps. Clear mastoid air cells. Bilateral cataract extraction. Other: None. CT CERVICAL SPINE FINDINGS Alignment: Trace retrolisthesis of C4 on C5. Skull base and vertebrae: No acute fracture or suspicious osseous lesion. Soft tissues and spinal canal: No prevertebral fluid or swelling. No visible canal hematoma. Disc levels: Moderate disc space narrowing from C3-4 to C5-6. Moderate multilevel facet arthrosis. Severe bilateral neural foraminal stenosis at C4-5 and moderate to severe bilateral neural foraminal stenosis at C5-6 due to uncovertebral spurring. Upper chest: Partially visualized large right pleural effusion. Other: Mild-to-moderate calcific atherosclerosis involving the carotid arteries. IMPRESSION: 1. No evidence of acute intracranial abnormality. 2. Bilateral temporal lobe encephalomalacia suggesting remote trauma. 3. Mild chronic small vessel ischemic disease. 4. No acute cervical spine fracture. 5. Partially visualized large right pleural effusion. A chest radiograph is pending. Electronically Signed   By: Sebastian Ache M.D.   On: 05/27/2020 02:42   DG Chest Port 1 View  Result Date: 05/27/2020 CLINICAL DATA:  Trauma. EXAM: PORTABLE CHEST 1 VIEW COMPARISON:  Cervical spine CT 05/27/2020 FINDINGS: The cardiac silhouette is borderline to mildly enlarged. Veiling opacity in the right hemithorax corresponds to a large pleural effusion seen on the earlier  cervical spine CT. There is atelectasis or consolidation in the right lung base. Mild left basilar opacity is also noted. No pneumothorax is identified. A lateral left fifth rib  fracture is favored to be chronic. IMPRESSION: Right pleural effusion and right greater than left basilar atelectasis or consolidation. Electronically Signed   By: Sebastian AcheAllen  Grady M.D.   On: 05/27/2020 02:49   DG Foot Complete Left  Result Date: 05/27/2020 CLINICAL DATA:  Fall, pain EXAM: LEFT FOOT - COMPLETE 3+ VIEW COMPARISON:  None. FINDINGS: Minimally posterolaterally (4mm lateral) displaced,diagonal fracture through the mid-distal body the first digit proximal phalanx. No definite intra-articular extension. Moderate degenerative changes of the metatarsophalangeal joint. Bulky posterior calcaneal spur. Plantar calcaneal spur. Associated first digit subcutaneous soft tissue edema. IMPRESSION: Extra-articular, minimallyposterolaterally displaced,acute fracture through the mid-distal body the first digit proximal phalanx Electronically Signed   By: Tish FredericksonMorgane  Naveau M.D.   On: 05/27/2020 03:57    Procedures .Marland Kitchen.Laceration Repair  Date/Time: 05/27/2020 3:54 AM Performed by: Dione BoozeGlick, Rodert Hinch, MD Authorized by: Dione BoozeGlick, Chivon Lepage, MD   Consent:    Consent obtained: Implied consent. Universal protocol:    Imaging studies available: yes     Required blood products, implants, devices, and special equipment available: yes     Site/side marked: yes     Immediately prior to procedure, a time out was called: yes     Patient identity confirmed:  Hospital-assigned identification number and arm band Anesthesia:    Anesthesia method:  None Laceration details:    Location:  Face   Face location:  Forehead   Length (cm):  1   Depth (mm):  1 Pre-procedure details:    Preparation:  Patient was prepped and draped in usual sterile fashion and imaging obtained to evaluate for foreign bodies Exploration:    Limited defect created (wound extended): no     Hemostasis achieved with:  Direct pressure   Wound extent: no foreign bodies/material noted     Contaminated: no   Treatment:    Area cleansed with:  Saline    Amount of cleaning:  Standard   Debridement:  None   Undermining:  None   Scar revision: no   Skin repair:    Repair method:  Tissue adhesive Approximation:    Approximation:  Close Repair type:    Repair type:  Simple Post-procedure details:    Dressing:  Open (no dressing)   Procedure completion:  Tolerated well, no immediate complications .Marland Kitchen.Laceration Repair  Date/Time: 05/27/2020 3:56 AM Performed by: Dione BoozeGlick, Yanissa Michalsky, MD Authorized by: Dione BoozeGlick, Reene Harlacher, MD   Consent:    Consent obtained: Implied consent. Universal protocol:    Required blood products, implants, devices, and special equipment available: yes     Site/side marked: yes     Immediately prior to procedure, a time out was called: yes     Patient identity confirmed:  Hospital-assigned identification number and arm band Anesthesia:    Anesthesia method:  None Laceration details:    Location:  Hand   Hand location:  L hand, dorsum   Length (cm):  1   Depth (mm):  1 Pre-procedure details:    Preparation:  Patient was prepped and draped in usual sterile fashion Exploration:    Limited defect created (wound extended): no     Hemostasis achieved with:  Direct pressure   Wound extent: no foreign bodies/material noted     Contaminated: no   Treatment:    Area cleansed with:  Saline   Amount of cleaning:  Standard   Debridement:  None   Undermining:  None   Scar revision: no   Skin repair:    Repair method:  Tissue adhesive Approximation:    Approximation:  Close Repair type:    Repair type:  Simple Post-procedure details:    Dressing:  Open (no dressing)   Procedure completion:  Tolerated well, no immediate complications    CRITICAL CARE Performed by: Dione Booze Total critical care time: 45 minutes Critical care time was exclusive of separately billable procedures and treating other patients. Critical care was necessary to treat or prevent imminent or life-threatening deterioration. Critical care was time  spent personally by me on the following activities: development of treatment plan with patient and/or surrogate as well as nursing, discussions with consultants, evaluation of patient's response to treatment, examination of patient, obtaining history from patient or surrogate, ordering and performing treatments and interventions, ordering and review of laboratory studies, ordering and review of radiographic studies, pulse oximetry and re-evaluation of patient's condition.  Medications Ordered in ED Medications - No data to display  ED Course  I have reviewed the triage vital signs and the nursing notes.  Pertinent labs & imaging results that were available during my care of the patient were reviewed by me and considered in my medical decision making (see chart for details).  MDM Rules/Calculators/A&P Unwitnessed fall with forehead laceration and laceration of left hand.  Patient is on anticoagulants, so will be sent for CT of head and cervical spine.  He reported complaints of left foot pain.  Will get x-ray although no concerning findings were present on physical exam.  He has no prior records in the [facility] health system.  ECG shows atrial fibrillation with slow ventricular response.  Review of his medication list shows he is on a fairly high dose of metoprolol-75 mg twice a day, as well as diltiazem 120 mg a day.  On review of past records, he had been seen recently by cardiology at which time heart rate was in the upper 90s and diltiazem was added for purposes of better rate control.  CT scans show no acute injury.  Chest x-ray shows a right pleural effusion.  Foot x-ray does show fracture of the proximal phalanx of the left first toe.  He is placed in a postop shoe and will need to be referred to orthopedics.  He is sent back to his skilled nursing facility with instructions to reduce dose of metoprolol to 50 mg twice a day and follow-up with his cardiologist in 1 week to decide if additional  medication adjustment would be indicated.  Final Clinical Impression(s) / ED Diagnoses Final diagnoses:  Trauma  Atrial fibrillation, chronic (HCC)  Chronic anticoagulation  Fall at nursing home, initial encounter  Laceration of forehead, initial encounter  Laceration of left hand, initial encounter  Closed fracture of proximal phalanx of left great toe, initial encounter    Rx / DC Orders ED Discharge Orders         Ordered    metoprolol tartrate (LOPRESSOR) 25 MG tablet  2 times daily        05/27/20 0431           Dione Booze, MD 05/27/20 [phone]

## 2020-05-27 NOTE — ED Notes (Signed)
Called [facility] for transport to [facility]

## 2020-05-28 ENCOUNTER — Telehealth: Payer: Self-pay | Admitting: Orthopaedic Surgery

## 2020-05-28 ENCOUNTER — Encounter: Payer: Self-pay | Admitting: Cardiovascular Disease

## 2020-05-28 ENCOUNTER — Telehealth: Payer: Self-pay | Admitting: Cardiovascular Disease

## 2020-05-28 ENCOUNTER — Encounter (HOSPITAL_COMMUNITY): Payer: Self-pay | Admitting: Urology

## 2020-05-28 NOTE — Telephone Encounter (Signed)
James Greer. Daughter In [place] of the patient called. She had concerns about two different medications the patient was taking  Pt c/o medication issue:  1. Name of Medication: Metoprolol Tartrate 75 MG TABS  2. How are you currently taking this medication (dosage and times per day)? 50 mg 2x daily  3. Are you having a reaction (difficulty breathing--STAT)? no  4. What is your medication issue? DIL wanted to see if Dr. Tresa Endo agreed with the change in medication that was made.The patient fell at his [facility] unit and went to the hospital. While at the hospital the HR was low so the hospital adjusted the medication.    Pt c/o medication issue:  1. Name of Medication: diltiazem (CARDIZEM CD) 120 MG 24 hr capsule  2. How are you currently taking this medication (dosage and times per day)? Pt took 12/23 and 12/24  3. Are you having a reaction (difficulty breathing--STAT)? Dizziness  4. What is your medication issue? Daughter wanted to know what would happen if the patient doesn't take the medication any more. The patient wasn't supposed to be given these medications yet until the DIL talked to Dr. Tresa Endo.   DIL was concerned that the patient was not able to be seen until 06/18/20 and wanted the medication questions addressed.

## 2020-05-28 NOTE — Telephone Encounter (Signed)
Per Dr. Magnus Ivan, this does not have to be emergent appointment. Patient is in post op shoe.

## 2020-05-28 NOTE — Telephone Encounter (Signed)
Patient's daughter called back and states that ED did not give post op shoe. Patient has nothing supportive on fractured foot at all and has dementia. Offered work in this afternoon which they could not make. Work in appt scheduled for 05/30/2020 at 10:00am.

## 2020-05-28 NOTE — Telephone Encounter (Signed)
Left message for pt's daughter-in-law Annice Pih to call back (okay per DPR).

## 2020-05-28 NOTE — Telephone Encounter (Signed)
Patient's daughter called about her father being seen at [facility] for fractured left foot. Please call to schedule appt with [facility]. Phone number is [phone].

## 2020-05-29 ENCOUNTER — Other Ambulatory Visit: Payer: Self-pay

## 2020-05-29 ENCOUNTER — Ambulatory Visit (HOSPITAL_COMMUNITY): Payer: Medicare HMO | Admitting: Physician Assistant

## 2020-05-29 ENCOUNTER — Ambulatory Visit (HOSPITAL_COMMUNITY)
Admission: RE | Admit: 2020-05-29 | Discharge: 2020-05-29 | Disposition: A | Discharge status: Home / Self Care | Payer: Medicare HMO | Source: Ambulatory Visit | Attending: Physician Assistant | Admitting: Physician Assistant

## 2020-05-29 VITALS — BP 166/92 | HR 65 | Ht 74.0 in

## 2020-05-29 DIAGNOSIS — I4510 Unspecified right bundle-branch block: Secondary | ICD-10-CM | POA: Diagnosis not present

## 2020-05-29 DIAGNOSIS — N1831 Chronic kidney disease, stage 3a: Secondary | ICD-10-CM | POA: Diagnosis not present

## 2020-05-29 DIAGNOSIS — I4821 Permanent atrial fibrillation: Secondary | ICD-10-CM | POA: Insufficient documentation

## 2020-05-29 DIAGNOSIS — I129 Hypertensive chronic kidney disease with stage 1 through stage 4 chronic kidney disease, or unspecified chronic kidney disease: Secondary | ICD-10-CM | POA: Diagnosis not present

## 2020-05-29 DIAGNOSIS — Z79899 Other long term (current) drug therapy: Secondary | ICD-10-CM | POA: Diagnosis not present

## 2020-05-29 DIAGNOSIS — D6869 Other thrombophilia: Secondary | ICD-10-CM | POA: Insufficient documentation

## 2020-05-29 DIAGNOSIS — Z7901 Long term (current) use of anticoagulants: Secondary | ICD-10-CM | POA: Diagnosis not present

## 2020-05-29 MED ORDER — APIXABAN 5 MG PO TABS: 5.0000 mg | ORAL_TABLET | Freq: Two times a day (BID) | ORAL | 3 refills | Status: AC

## 2020-05-29 NOTE — Addendum Note (Signed)
Encounter addended by: Learta Codding, CMA on: 05/29/2020 4:52 PM  Actions taken: Order list changed, Medication long-term status modified

## 2020-05-29 NOTE — Progress Notes (Signed)
Primary Care Physician: Kermit Balo, DO Primary Cardiologist: Dr Tresa Endo Primary Electrophysiologist: none Referring Physician: [facility]   James Greer is a 86 y.o. male with a history of dementia, severe LVH, atrial fibrillation, HTN, CKD, HLD who presents for consultation in the [facility]. The patient was initially diagnosed with atrial fibrillation remotely and moved to [place] from [place] after the death of his wife. Patient is on Eliquis for a CHADS2VASC score of 3. He was seen by Dr Tresa Endo on 05/17/20 and he was started on diltiazem for increased rate control. On 05/27/20, he presented to the ED after a fall in the bathroom. He had lacerations on his forehead and hand. He also had a foot fracture. ECG on presentation showed afib with slow response HR 38. His metoprolol was decreased at discharge. His history is provided by his care aide Gloriajean Dell and his daughter Annice Pih. He has been doing well since with no further falls or dizziness. He has not resumed diltiazem.   Today, he denies symptoms of palpitations, chest pain, shortness of breath, orthopnea, PND, lower extremity edema, dizziness, presyncope, syncope, snoring, daytime somnolence, bleeding, or neurologic sequela. The patient is tolerating medications without difficulties and is otherwise without complaint today.    Atrial Fibrillation Risk Factors:  he does not have symptoms or diagnosis of sleep apnea. he does not have a history of rheumatic fever.   he has a BMI of Body mass index is 23.11 kg/m.Marland Kitchen There were no vitals filed for this visit.  Family History  Problem Relation Age of Onset  . Aneurysm Sister      Atrial Fibrillation Management history:  Previous antiarrhythmic drugs: none Previous cardioversions: unknown  Previous ablations: none CHADS2VASC score: 3 Anticoagulation history: Eliquis   Past Medical History:  Diagnosis Date  . Carpal tunnel syndrome   .  Chronic atrial fibrillation, unspecified (HCC)   . Chronic kidney disease   . Chronic kidney disease, stage 3a (HCC)   . Degenerative disease of nervous system (HCC)   . Hyperlipidemia   . Localized edema   . Male erectile disorder   . Other thrombophilia (HCC)   . Personal history of other (healed) physical injury and trauma   . Rhinitis, allergic   . Toe fracture, left 05/27/2020   fracture of the proximal phalanx of the left first toe  . Trigger finger   . Type 2 diabetes mellitus (HCC)   . Unspecified convulsions (HCC)   . Unspecified dementia with behavioral disturbance (HCC)   . Urolithiasis    Past Surgical History:  Procedure Laterality Date  . CRANIOTOMY     with evacuation of subdural hematoma  . EXTRACORPOREAL SHOCK WAVE LITHOTRIPSY      Current Outpatient Medications  Medication Sig Dispense Refill  . acetaminophen (TYLENOL) 500 MG tablet Take 650 mg by mouth every 4 (four) hours as needed.    Marland Kitchen atorvastatin (LIPITOR) 40 MG tablet Take 40 mg by mouth daily.    . digoxin (LANOXIN) 0.125 MG tablet Take 0.125 mg by mouth daily.    . furosemide (LASIX) 20 MG tablet Take 20 mg by mouth 2 (two) times daily.    . Glucerna (GLUCERNA) LIQD Take 237 mLs by mouth 2 (two) times daily between meals.    . hydroxypropyl methylcellulose / hypromellose (ISOPTO TEARS / GONIOVISC) 2.5 % ophthalmic solution Place 1 drop into both eyes 3 (three) times daily as needed for dry eyes.    . metoprolol tartrate (  LOPRESSOR) 25 MG tablet Take 2 tablets (50 mg total) by mouth 2 (two) times daily. 120 tablet 0  . Multiple Vitamins-Minerals (MULTI-DAY PLUS MINERALS PO) Take 18 mg by mouth. Iron-400 mcg-25 mcg; amt: 1 tablet; oral. Once a morning    . OVER THE COUNTER MEDICATION Take by mouth See admin instructions. 2 fiber thins with 8 ounces of water,juice or hot tea every morning.    . potassium chloride (MICRO-K) 10 MEQ CR capsule Take 10 mEq by mouth daily.    . QUEtiapine (SEROQUEL) 50 MG  tablet Take 75 mg by mouth at bedtime.    . sodium fluoride (PREVIDENT 5000 PLUS) 1.1 % CREA dental cream Place 1 application onto teeth every evening.    . traZODone (DESYREL) 50 MG tablet Take 50 mg by mouth at bedtime.    Marland Kitchen apixaban (ELIQUIS) 5 MG TABS tablet Take 1 tablet (5 mg total) by mouth 2 (two) times daily. 60 tablet 3  . diltiazem (CARDIZEM CD) 120 MG 24 hr capsule Take 1 capsule (120 mg total) by mouth daily. Take 1 capsule (120 mg) by mouth at bedtime. (Patient not taking: Reported on 05/29/2020) 90 capsule 3  . diltiazem (DILACOR XR) 120 MG 24 hr capsule Take 120 mg by mouth daily. (Patient not taking: Reported on 05/29/2020)     No current facility-administered medications for this encounter.    No Known Allergies  Social History   Socioeconomic History  . Marital status: Widowed    Spouse name: Not on file  . Number of children: 2  . Years of education: Not on file  . Highest education level: Not on file  Occupational History  . Not on file  Tobacco Use  . Smoking status: Never Smoker  . Smokeless tobacco: Never Used  Substance and Sexual Activity  . Alcohol use: Not Currently  . Drug use: Not Currently  . Sexual activity: Not Currently  Other Topics Concern  . Not on file  Social History Narrative   ** Merged History Encounter **       Social Determinants of Health   Financial Resource Strain: Not on file  Food Insecurity: Not on file  Transportation Needs: Not on file  Physical Activity: Not on file  Stress: Not on file  Social Connections: Not on file  Intimate Partner Violence: Not on file     ROS- All systems are reviewed and negative except as per the HPI above.  Physical Exam: Vitals:   05/29/20 1350  BP: (!) 166/92  Pulse: 65  Height: 6\' 2"  (1.88 m)    GEN- The patient is well appearing elderly male, alert but not oriented today.  Head- normocephalic, atraumatic Eyes-  Sclera clear, conjunctiva pink Ears- hearing intact Oropharynx-  clear Neck- supple  Lungs- Clear to ausculation bilaterally, normal work of breathing Heart- irregular rate and rhythm, no murmurs, rubs or gallops  GI- soft, NT, ND, + BS Extremities- no clubbing, cyanosis, or edema MS- no significant deformity or atrophy Skin- no rash or lesion Psych- euthymic mood, full affect Neuro- strength and sensation are intact  Wt Readings from Last 3 Encounters:  05/27/20 81.6 kg  05/17/20 81.4 kg  05/08/20 81.7 kg    EKG today demonstrates afib HR 65, RBBB, QRS 136, QTc 463  Echo 05/22/20 demonstrated  1. There is severe concentric LVH up to 2.4 cm. LV function is preserved.  There is increased thickness of the RV as well and a small pericardial  effusion. The patient  has normal BP and no history of uncontrolled  hypertension. Strain imaing not  performed. Given the patient's age, and severe concentric LVH, would  consider work-up for cardiac amyloidosis vs other infiltrative  cardiomyopathy. Left ventricular ejection fraction, by estimation, is 65  to 70%. The left ventricle has normal function.  The left ventricle has no regional wall motion abnormalities. There is  severe concentric left ventricular hypertrophy. Left ventricular diastolic  function could not be evaluated.  2. Right ventricular systolic function is mildly reduced. The right  ventricular size is mildly enlarged. Moderately increased right  ventricular wall thickness. There is normal pulmonary artery systolic  pressure. The estimated right ventricular systolic  pressure is 31.5 mmHg.  3. Left atrial size was mildly dilated.  4. Right atrial size was mildly dilated.  5. A small pericardial effusion is present. The pericardial effusion is  posterior to the left ventricle and the left atrium.  6. The mitral valve is grossly normal. Mild mitral valve regurgitation.  No evidence of mitral stenosis.  7. The aortic valve is tricuspid. Aortic valve regurgitation is not   visualized. No aortic stenosis is present.  8. The inferior vena cava is normal in size with greater than 50%  respiratory variability, suggesting right atrial pressure of 3 mmHg.   Epic records are reviewed at length today  CHA2DS2-VASc Score = 3  The patient's score is based upon: CHF History: No HTN History: Yes Diabetes History: No Stroke History: No Vascular Disease History: No Age Score: 2 Gender Score: 0      ASSESSMENT AND PLAN: 1. Permanent atrial fibrillation  The patient's CHA2DS2-VASc score is 3, indicating a 3.2% annual risk of stroke.   Patient in rate controlled afib. Would favor conservative rate control given h/o significant bradycardia. Will increase Eliquis 5 mg BID (weight >60 kg, Cr < 1.5) Continue digoxin 0.125 mg daily Continue metoprolol 50 mg BID Will not resume diltiazem at this time.  2. Secondary Hypercoagulable State (ICD10:  D68.69) The patient is at significant risk for stroke/thromboembolism based upon his CHA2DS2-VASc Score of 3.  Continue Apixaban (Eliquis).   3. HTN Elevated today, home health aid with him today reports his BP is 120-130 systolic at home. H/o hypotension and falls per daughter. Would manage conservatively.    Follow up with [facility] as scheduled.    Jorja Loa PA-C [facility] [address] [place], [place] [address] [phone] 05/29/2020 3:35 PM

## 2020-05-30 ENCOUNTER — Encounter: Payer: Self-pay | Admitting: Orthopaedic Surgery

## 2020-05-30 ENCOUNTER — Ambulatory Visit (INDEPENDENT_AMBULATORY_CARE_PROVIDER_SITE_OTHER): Payer: Medicare HMO | Admitting: Orthopaedic Surgery

## 2020-05-30 DIAGNOSIS — M25562 Pain in left knee: Secondary | ICD-10-CM

## 2020-05-30 DIAGNOSIS — S92422A Displaced fracture of distal phalanx of left great toe, initial encounter for closed fracture: Secondary | ICD-10-CM | POA: Diagnosis not present

## 2020-05-30 DIAGNOSIS — G8929 Other chronic pain: Secondary | ICD-10-CM

## 2020-05-30 MED ORDER — LIDOCAINE HCL 1 % IJ SOLN
3.0000 mL | INTRAMUSCULAR | Status: AC | PRN
  Administered 2020-05-30: 3 mL

## 2020-05-30 MED ORDER — METHYLPREDNISOLONE ACETATE 40 MG/ML IJ SUSP
40.0000 mg | INTRAMUSCULAR | Status: AC | PRN
  Administered 2020-05-30: 40 mg via INTRA_ARTICULAR

## 2020-05-30 NOTE — Progress Notes (Signed)
Office Visit Note   Patient: James Greer           Date of Birth: 12-17-1933           MRN: [id number] Visit Date: 05/30/2020              Requested by: Kermit Balo, DO [address] [place],  [place] [address] PCP: Kermit Balo, DO   Assessment & Plan: Visit Diagnoses:  1. Chronic pain of left knee   2. Displaced fracture of distal phalanx of left great toe, initial encounter for closed fracture     Plan: From the standpoint of the fracture of the left great toe, he can weight-bear as tolerated in a postop shoe and should wear this for about 4 to 6 weeks and can transition back to regular shoes after that.  From a knee standpoint, I would not recommend an MRI in someone who has some dementia because he would not be able to hold still for the MRI and this is really not warranted because you would not recommend surgery on in the in someone who has some dementia.  I did feel that it was appropriate to place a steroid injection in the knee which she tolerated well.  All questions and concerns were answered and addressed.  Follow-up can be as needed.  Follow-Up Instructions: Return if symptoms worsen or fail to improve.   Orders:  No orders of the defined types were placed in this encounter.  No orders of the defined types were placed in this encounter.     Procedures: Large Joint Inj: L knee on 05/30/2020 10:57 AM Indications: diagnostic evaluation and pain Details: 22 G 1.5 in needle, superolateral approach  Arthrogram: No  Medications: 3 mL lidocaine 1 %; 40 mg methylPREDNISolone acetate 40 MG/ML Outcome: tolerated well, no immediate complications Procedure, treatment alternatives, risks and benefits explained, specific risks discussed. Consent was given by the patient. Immediately prior to procedure a time out was called to verify the correct patient, procedure, equipment, support staff and site/side marked as required. Patient was prepped and draped in the usual sterile  fashion.       Clinical Data: No additional findings.   Subjective: Chief Complaint  Patient presents with  . Left Foot - Injury  The patient is an 86 year old gentleman referred from emergency room after mechanical fall in which he sustained a fracture of his left great toe proximal phalanx.  He states that his skilled nursing facility secondary to do some dementia.  He has been having some left knee issues in the facility wonders if we should order an MRI for his left knee.  There actually x-rays that accompany him.  He does have a hard time mobilizing but does walk and it is hard to keep him still and not ambulating according to the person from this facility who is with him.  HPI  Review of Systems There is currently listed no chest pain or shortness of breath.  No fever, chills, nausea, vomiting  Objective: Vital Signs: There were no vitals taken for this visit.  Physical Exam He does follow commands appropriately and is alert but does show some signs of dementia. Ortho Exam Examination of his left knee shows it moves smoothly and fluidly with some patellofemoral crepitation and medial joint line tenderness.  There is a mild effusion.  Examination of his left great toe shows pain distal to the MTP joint. Specialty Comments:  No specialty comments available.  Imaging:  No results found. X-rays of the accompaniment of his left knee show some mild to moderate arthritic changes in the knee.  X-rays on the canopy system of his left great toe showed a proximal phalanx fracture with some angulation and displacement  PMFS History: Patient Active Problem List   Diagnosis Date Noted  . Permanent atrial fibrillation (HCC) 05/29/2020  . CKD (chronic kidney disease), stage III (HCC) 05/08/2020  . Dementia (HCC) 02/28/2020  . Subdural hematoma (HCC) 02/28/2020  . Alcohol dependence (HCC) 02/28/2020  . Paroxysmal A-fib (HCC) 02/28/2020  . Secondary hypercoagulable state (HCC)  02/28/2020  . Right bundle branch block (RBBB) 02/28/2020  . Bilateral hearing loss 02/28/2020  . Right carpal tunnel syndrome 02/28/2020  . Hyperlipidemia associated with type 2 diabetes mellitus (HCC) 02/28/2020  . Type 2 diabetes mellitus with diabetic chronic kidney disease (HCC) 02/28/2020  . Benign prostatic hyperplasia with urinary frequency 02/28/2020   Past Medical History:  Diagnosis Date  . Carpal tunnel syndrome   . Chronic atrial fibrillation, unspecified (HCC)   . Chronic kidney disease   . Chronic kidney disease, stage 3a (HCC)   . Degenerative disease of nervous system (HCC)   . Hyperlipidemia   . Localized edema   . Male erectile disorder   . Other thrombophilia (HCC)   . Personal history of other (healed) physical injury and trauma   . Rhinitis, allergic   . Toe fracture, left 05/27/2020   fracture of the proximal phalanx of the left first toe  . Trigger finger   . Type 2 diabetes mellitus (HCC)   . Unspecified convulsions (HCC)   . Unspecified dementia with behavioral disturbance (HCC)   . Urolithiasis     Family History  Problem Relation Age of Onset  . Aneurysm Sister     Past Surgical History:  Procedure Laterality Date  . CRANIOTOMY     with evacuation of subdural hematoma  . EXTRACORPOREAL SHOCK WAVE LITHOTRIPSY     Social History   Occupational History  . Not on file  Tobacco Use  . Smoking status: Never Smoker  . Smokeless tobacco: Never Used  Substance and Sexual Activity  . Alcohol use: Not Currently  . Drug use: Not Currently  . Sexual activity: Not Currently

## 2020-06-05 ENCOUNTER — Ambulatory Visit: Admit: 2020-06-05 | Payer: Medicare HMO | Admitting: Urology

## 2020-06-05 SURGERY — CYSTOSCOPY/URETEROSCOPY/HOLMIUM LASER/STENT PLACEMENT
Anesthesia: General | Laterality: Right

## 2020-06-05 NOTE — Telephone Encounter (Signed)
Looks like she may be in the hospital, will you ask Dr.Kelly about these medication questions per daughter in law.   Thank you!

## 2020-06-07 ENCOUNTER — Encounter: Payer: Self-pay | Admitting: Adult Health

## 2020-06-07 ENCOUNTER — Telehealth: Payer: Self-pay | Admitting: Cardiovascular Disease

## 2020-06-07 ENCOUNTER — Non-Acute Institutional Stay (SKILLED_NURSING_FACILITY): Payer: Medicare HMO | Admitting: Adult Health

## 2020-06-07 DIAGNOSIS — I480 Paroxysmal atrial fibrillation: Secondary | ICD-10-CM | POA: Diagnosis not present

## 2020-06-07 DIAGNOSIS — G301 Alzheimer's disease with late onset: Secondary | ICD-10-CM | POA: Diagnosis not present

## 2020-06-07 DIAGNOSIS — N1831 Chronic kidney disease, stage 3a: Secondary | ICD-10-CM

## 2020-06-07 DIAGNOSIS — N200 Calculus of kidney: Secondary | ICD-10-CM

## 2020-06-07 DIAGNOSIS — E1122 Type 2 diabetes mellitus with diabetic chronic kidney disease: Secondary | ICD-10-CM | POA: Diagnosis not present

## 2020-06-07 DIAGNOSIS — F02818 Dementia in other diseases classified elsewhere, unspecified severity, with other behavioral disturbance: Secondary | ICD-10-CM

## 2020-06-07 DIAGNOSIS — F0281 Dementia in other diseases classified elsewhere with behavioral disturbance: Secondary | ICD-10-CM

## 2020-06-07 HISTORY — DX: Calculus of kidney: N20.0

## 2020-06-07 NOTE — Progress Notes (Signed)
[facility]   Place of Service:  SNF (31) Provider:  Peggye Leyhristy Shyler Hamill, ANP [facility] [phone]  Kermit Baloeed, Tiffany L, DO  Patient Care Team: Kermit Baloeed, Tiffany L, DO as PCP - General (Geriatric Medicine) Lennette BihariKelly, Thomas A, MD as PCP - Cardiology (Cardiology) Community, [facility] (Skilled Nursing Facility) Kermit Baloeed, Tiffany L, DO (Geriatric Medicine)  Extended Emergency Contact Information Primary Emergency Contact: Patsy LagerGolden, Jackie Address: [address]          [place], [place] [address] [place] Home Phone: [phone] Relation: Relative Secondary Emergency Contact: Wende CreaseGolden, Adam Address: [address]          [place], [place] [address] [place] Mobile Phone: [phone] Relation: Son  Code Status:  DNR Goals of care: Advanced Directive information Advanced Directives 05/27/2020  Does Patient Have a Medical Advance Directive? No;Yes  Type of Advance Directive Out of facility DNR (pink MOST or yellow form)  Does patient want to make changes to medical advance directive? Yes (ED - Information included in AVS)  Copy of Healthcare Power of Attorney in Chart? -  Would patient like information on creating a medical advance directive? No - Patient declined  Pre-existing out of facility DNR order (yellow form or pink MOST form) Yellow form placed in chart (order not valid for inpatient use)     Chief Complaint  Patient presents with  . Acute Visit    Behaviors     HPI:  Pt is a 87 y.o. male seen today for an acute visit for behaviors. Mr. Renette ButtersGolden has Alzheimer's dementia and resides in the memory care unit. The nurse at night has been reporting increased agitation. He tends to become easily angered when staff enter the room. His caregiver is at the bedside and states that he tends to perseverate over keeping the door locked and doesn't want others in his room. The nursing notes state that he yells and  slams the door and they are concern for harm to the resident or others.   He fell and went to the ED on 12/26 and sustained laceration to the head, hand, a left great toe fracture and later had left knee swelling. He saw orthopedics on 12/29. [facility] but concern for meniscal tear. Ortho gave a steroid injection and allowed for WBAT.  No MRI due to his dementia. At this time he is having some mild pain per the caregiver. His left leg remains swollen.   Afib: off cardizem due to bradycardia. HR 60-90 range in matrix. No issues with weight gain, edema, sob, doe, etc.   SBP 130-160 range.   He was going to have a cystoscopy and litho/stent placement due to a large stone in the right ureter but this is put on hold due to his fall.    Past Medical History:  Diagnosis Date  . Carpal tunnel syndrome   . Chronic atrial fibrillation, unspecified (HCC)   . Chronic kidney disease   . Chronic kidney disease, stage 3a (HCC)   . Degenerative disease of nervous system (HCC)   . Hyperlipidemia   . Localized edema   . Male erectile disorder   . Other thrombophilia (HCC)   . Personal history of other (healed) physical injury and trauma   . Rhinitis, allergic   . Toe fracture, left 05/27/2020   fracture of the proximal phalanx of the left first toe  . Trigger  finger   . Type 2 diabetes mellitus ([place])   . Unspecified convulsions ([place])   . Unspecified dementia with behavioral disturbance ([place])   . Urolithiasis    Past Surgical History:  Procedure Laterality Date  . CRANIOTOMY     with evacuation of subdural hematoma  . EXTRACORPOREAL SHOCK WAVE LITHOTRIPSY      No Known Allergies  Outpatient Encounter Medications as of 06/07/2020  Medication Sig  . apixaban (ELIQUIS) 5 MG TABS tablet Take 1 tablet (5 mg total) by mouth 2 (two) times daily.  . metoprolol tartrate (LOPRESSOR) 25 MG tablet Take 2 tablets (50 mg total) by mouth 2 (two) times daily.  Marland Kitchen OVER  THE COUNTER MEDICATION Take by mouth See admin instructions. 2 fiber thins with 8 ounces of water,juice or hot tea every morning.  . potassium chloride (MICRO-K) 10 MEQ CR capsule Take 10 mEq by mouth daily.  . QUEtiapine (SEROQUEL) 50 MG tablet Take 75 mg by mouth at bedtime.  . traZODone (DESYREL) 50 MG tablet Take 50 mg by mouth at bedtime.  Marland Kitchen acetaminophen (TYLENOL) 500 MG tablet Take 650 mg by mouth every 4 (four) hours as needed.  Marland Kitchen atorvastatin (LIPITOR) 40 MG tablet Take 40 mg by mouth daily.  . digoxin (LANOXIN) 0.125 MG tablet Take 0.125 mg by mouth daily.  . furosemide (LASIX) 20 MG tablet Take 20 mg by mouth 2 (two) times daily.  . Glucerna (GLUCERNA) LIQD Take 237 mLs by mouth 2 (two) times daily between meals.  . hydroxypropyl methylcellulose / hypromellose (ISOPTO TEARS / GONIOVISC) 2.5 % ophthalmic solution Place 1 drop into both eyes 3 (three) times daily as needed for dry eyes.  . Multiple Vitamins-Minerals (MULTI-DAY PLUS MINERALS PO) Take 18 mg by mouth. Iron-400 mcg-25 mcg; amt: 1 tablet; oral. Once a morning  . sodium fluoride (PREVIDENT 5000 PLUS) 1.1 % CREA dental cream Place 1 application onto teeth every evening.   No facility-administered encounter medications on file as of 06/07/2020.    Review of Systems  Unable to perform ROS: Dementia    Immunization History  Administered Date(s) Administered  . Influenza-Unspecified 03/11/2011, 03/08/2012, 03/02/2013, 08/09/2013, 02/27/2016, 03/02/2017, 01/01/2019, 03/26/2020  . Moderna Sars-Covid-2 Vaccination 07/05/2019, 08/12/2019  . Pneumococcal Conjugate-13 06/13/2009  . Zoster 04/02/2010  . Zoster Recombinat (Shingrix) 05/03/2020   Pertinent  Health Maintenance Due  Topic Date Due  . FOOT EXAM  Never done  . PNA vac Low Risk Adult (2 of 2 - PPSV23) 06/13/2010  . URINE MICROALBUMIN  06/08/2020 (Originally 02/11/1944)  . HEMOGLOBIN A1C  07/19/2020  . OPHTHALMOLOGY EXAM  04/05/2021  . INFLUENZA VACCINE  Completed    Fall Risk  03/17/2020 03/14/2020  Falls in the past year? 1 1  Number falls in past yr: 1 0  Injury with Fall? 1 1  Risk for fall due to : History of fall(s);Impaired balance/gait;Impaired mobility;Mental status change;Medication side effect -  Follow up Falls evaluation completed;Education provided;Falls prevention discussed;Follow up appointment -   Functional Status Survey:    There were no vitals filed for this visit. There is no height or weight on file to calculate BMI. Physical Exam Vitals and nursing note reviewed.  Constitutional:      General: He is not in acute distress.    Appearance: He is not diaphoretic.  HENT:     Head: Normocephalic and atraumatic.     Mouth/Throat:     Mouth: Mucous membranes are moist.     Pharynx: Oropharynx is clear.  Neck:     Thyroid: No thyromegaly.     Vascular: No JVD.     Trachea: No tracheal deviation.  Cardiovascular:     Rate and Rhythm: Normal rate. Rhythm irregular.     Heart sounds: No murmur heard.   Pulmonary:     Effort: Pulmonary effort is normal. No respiratory distress.     Breath sounds: Normal breath sounds. No wheezing.  Abdominal:     General: Bowel sounds are normal. There is no distension.     Palpations: Abdomen is soft.     Tenderness: There is no abdominal tenderness.  Musculoskeletal:        General: Swelling (mild to left knee with no effusion warmth or redness) present. No tenderness, deformity or signs of injury.     Right lower leg: Edema (trace) present.     Left lower leg: Edema (+1) present.     Comments: Left foot in orthotic shoe  Lymphadenopathy:     Cervical: No cervical adenopathy.  Skin:    General: Skin is warm and dry.  Neurological:     General: No focal deficit present.     Mental Status: He is alert. Mental status is at baseline.  Psychiatric:        Mood and Affect: Mood and affect and mood normal.     Labs reviewed: Recent Labs    01/23/20 0000 02/28/20 0000  03/01/20 0000  NA 142 139  --   K 4.0 4.2  --   CL 104 100  --   CO2 31* 32*  --   BUN 24*  --   --   CREATININE 1.1 1.3  --   CALCIUM 8.8  --  9.5   Recent Labs    01/23/20 0000  AST 27  ALT 23   Recent Labs    01/17/20 0000  HGB 13.1*  HCT 40*   Lab Results  Component Value Date   TSH 2.36 01/17/2020   Lab Results  Component Value Date   HGBA1C 6.7 01/17/2020   No results found for: CHOL, HDL, LDLCALC, LDLDIRECT, TRIG, CHOLHDL  Significant Diagnostic Results in last 30 days:  CT HEAD WO CONTRAST  Result Date: 05/27/2020 CLINICAL DATA:  Facial trauma. EXAM: CT HEAD WITHOUT CONTRAST CT CERVICAL SPINE WITHOUT CONTRAST TECHNIQUE: Multidetector CT imaging of the head and cervical spine was performed following the standard protocol without intravenous contrast. Multiplanar CT image reconstructions of the cervical spine were also generated. COMPARISON:  None. FINDINGS: CT HEAD FINDINGS Brain: There is no evidence of an acute infarct, intracranial hemorrhage, mass, midline shift, or extra-axial fluid collection. There is encephalomalacia in both temporal lobes. There is mild-to-moderate cerebral atrophy. Hypodensities in the cerebral white matter bilaterally are nonspecific but compatible with mild chronic small vessel ischemic disease. Vascular: Calcified atherosclerosis at the skull base. No hyperdense vessel. Skull: Right parietal craniotomy.  No fracture. Sinuses/Orbits: Prior functional endoscopic sinus surgery with complete opacification of the left frontal sinus and scattered polypoid mucosal thickening in the ethmoid sinuses, left maxillary sinus, and both nasal cavities suggesting polyps. Clear mastoid air cells. Bilateral cataract extraction. Other: None. CT CERVICAL SPINE FINDINGS Alignment: Trace retrolisthesis of C4 on C5. Skull base and vertebrae: No acute fracture or suspicious osseous lesion. Soft tissues and spinal canal: No prevertebral fluid or swelling. No visible  canal hematoma. Disc levels: Moderate disc space narrowing from C3-4 to C5-6. Moderate multilevel facet arthrosis. Severe bilateral neural foraminal stenosis at C4-5 and moderate to  severe bilateral neural foraminal stenosis at C5-6 due to uncovertebral spurring. Upper chest: Partially visualized large right pleural effusion. Other: Mild-to-moderate calcific atherosclerosis involving the carotid arteries. IMPRESSION: 1. No evidence of acute intracranial abnormality. 2. Bilateral temporal lobe encephalomalacia suggesting remote trauma. 3. Mild chronic small vessel ischemic disease. 4. No acute cervical spine fracture. 5. Partially visualized large right pleural effusion. A chest radiograph is pending. Electronically Signed   By: Sebastian Ache M.D.   On: 05/27/2020 02:42   CT CERVICAL SPINE WO CONTRAST  Result Date: 05/27/2020 CLINICAL DATA:  Facial trauma. EXAM: CT HEAD WITHOUT CONTRAST CT CERVICAL SPINE WITHOUT CONTRAST TECHNIQUE: Multidetector CT imaging of the head and cervical spine was performed following the standard protocol without intravenous contrast. Multiplanar CT image reconstructions of the cervical spine were also generated. COMPARISON:  None. FINDINGS: CT HEAD FINDINGS Brain: There is no evidence of an acute infarct, intracranial hemorrhage, mass, midline shift, or extra-axial fluid collection. There is encephalomalacia in both temporal lobes. There is mild-to-moderate cerebral atrophy. Hypodensities in the cerebral white matter bilaterally are nonspecific but compatible with mild chronic small vessel ischemic disease. Vascular: Calcified atherosclerosis at the skull base. No hyperdense vessel. Skull: Right parietal craniotomy.  No fracture. Sinuses/Orbits: Prior functional endoscopic sinus surgery with complete opacification of the left frontal sinus and scattered polypoid mucosal thickening in the ethmoid sinuses, left maxillary sinus, and both nasal cavities suggesting polyps. Clear mastoid air  cells. Bilateral cataract extraction. Other: None. CT CERVICAL SPINE FINDINGS Alignment: Trace retrolisthesis of C4 on C5. Skull base and vertebrae: No acute fracture or suspicious osseous lesion. Soft tissues and spinal canal: No prevertebral fluid or swelling. No visible canal hematoma. Disc levels: Moderate disc space narrowing from C3-4 to C5-6. Moderate multilevel facet arthrosis. Severe bilateral neural foraminal stenosis at C4-5 and moderate to severe bilateral neural foraminal stenosis at C5-6 due to uncovertebral spurring. Upper chest: Partially visualized large right pleural effusion. Other: Mild-to-moderate calcific atherosclerosis involving the carotid arteries. IMPRESSION: 1. No evidence of acute intracranial abnormality. 2. Bilateral temporal lobe encephalomalacia suggesting remote trauma. 3. Mild chronic small vessel ischemic disease. 4. No acute cervical spine fracture. 5. Partially visualized large right pleural effusion. A chest radiograph is pending. Electronically Signed   By: Sebastian Ache M.D.   On: 05/27/2020 02:42   DG Chest Port 1 View  Result Date: 05/27/2020 CLINICAL DATA:  Trauma. EXAM: PORTABLE CHEST 1 VIEW COMPARISON:  Cervical spine CT 05/27/2020 FINDINGS: The cardiac silhouette is borderline to mildly enlarged. Veiling opacity in the right hemithorax corresponds to a large pleural effusion seen on the earlier cervical spine CT. There is atelectasis or consolidation in the right lung base. Mild left basilar opacity is also noted. No pneumothorax is identified. A lateral left fifth rib fracture is favored to be chronic. IMPRESSION: Right pleural effusion and right greater than left basilar atelectasis or consolidation. Electronically Signed   By: Sebastian Ache M.D.   On: 05/27/2020 02:49   DG Foot Complete Left  Result Date: 05/27/2020 CLINICAL DATA:  Fall, pain EXAM: LEFT FOOT - COMPLETE 3+ VIEW COMPARISON:  None. FINDINGS: Minimally posterolaterally (78mm lateral)  displaced,diagonal fracture through the mid-distal body the first digit proximal phalanx. No definite intra-articular extension. Moderate degenerative changes of the metatarsophalangeal joint. Bulky posterior calcaneal spur. Plantar calcaneal spur. Associated first digit subcutaneous soft tissue edema. IMPRESSION: Extra-articular, minimallyposterolaterally displaced,acute fracture through the mid-distal body the first digit proximal phalanx Electronically Signed   By: Tish Frederickson M.D.   On:  05/27/2020 03:57   ECHOCARDIOGRAM COMPLETE  Result Date: 05/22/2020    ECHOCARDIOGRAM REPORT   Patient Name:   James Greer Date of Exam: 05/22/2020 Medical Rec #:  [id number]      Height:       75.0 in Accession #:    [id number]     Weight:       179.4 lb Date of Birth:  Jan 10, 1934      BSA:          2.095 m Patient Age:    86 years       BP:           126/83 mmHg Patient Gender: M              HR:           63 bpm. Exam Location:  [address] Procedure: 2D Echo, Cardiac Doppler and Color Doppler Indications:    Z01.89 Pre-operative clearance  History:        Patient has no prior history of Echocardiogram examinations.                 Arrythmias:Atrial Fibrillation; Risk Factors:Dyslipidemia,                 Diabetes and HLD, CKD.  Sonographer:    Clearence Ped RCS Referring Phys: [phone] THOMAS A KELLY IMPRESSIONS  1. There is severe concentric LVH up to 2.4 cm. LV function is preserved. There is increased thickness of the RV as well and a small pericardial effusion. The patient has normal BP and no history of uncontrolled hypertension. Strain imaing not performed. Given the patient's age, and severe concentric LVH, would consider work-up for cardiac amyloidosis vs other infiltrative cardiomyopathy. Left ventricular ejection fraction, by estimation, is 65 to 70%. The left ventricle has normal function. The left ventricle has no regional wall motion abnormalities. There is severe concentric left ventricular hypertrophy.  Left ventricular diastolic function could not be evaluated.  2. Right ventricular systolic function is mildly reduced. The right ventricular size is mildly enlarged. Moderately increased right ventricular wall thickness. There is normal pulmonary artery systolic pressure. The estimated right ventricular systolic pressure is 31.5 mmHg.  3. Left atrial size was mildly dilated.  4. Right atrial size was mildly dilated.  5. A small pericardial effusion is present. The pericardial effusion is posterior to the left ventricle and the left atrium.  6. The mitral valve is grossly normal. Mild mitral valve regurgitation. No evidence of mitral stenosis.  7. The aortic valve is tricuspid. Aortic valve regurgitation is not visualized. No aortic stenosis is present.  8. The inferior vena cava is normal in size with greater than 50% respiratory variability, suggesting right atrial pressure of 3 mmHg. FINDINGS  Left Ventricle: There is severe concentric LVH up to 2.4 cm. LV function is preserved. There is increased thickness of the RV as well and a small pericardial effusion. The patient has normal BP and no history of uncontrolled hypertension. Strain imaing not performed. Given the patient's age, and severe concentric LVH, would consider work-up for cardiac amyloidosis vs other infiltrative cardiomyopathy. Left ventricular ejection fraction, by estimation, is 65 to 70%. The left ventricle has normal function. The left ventricle has no regional wall motion abnormalities. The left ventricular internal cavity size was small. There is severe concentric left ventricular hypertrophy. Left ventricular diastolic function could not be evaluated due to atrial  fibrillation. Left ventricular diastolic function could not be evaluated. Right Ventricle: The right ventricular  size is mildly enlarged. Moderately increased right ventricular wall thickness. Right ventricular systolic function is mildly reduced. There is normal pulmonary artery  systolic pressure. The tricuspid regurgitant velocity is 2.67 m/s, and with an assumed right atrial pressure of 3 mmHg, the estimated right ventricular systolic pressure is 31.5 mmHg. Left Atrium: Left atrial size was mildly dilated. Right Atrium: Right atrial size was mildly dilated. Prominent Crista terminalis. Pericardium: A small pericardial effusion is present. The pericardial effusion is posterior to the left ventricle and the left atrium. Mitral Valve: The mitral valve is grossly normal. Mild mitral valve regurgitation. No evidence of mitral valve stenosis. Tricuspid Valve: The tricuspid valve is grossly normal. Tricuspid valve regurgitation is mild . No evidence of tricuspid stenosis. Aortic Valve: The aortic valve is tricuspid. Aortic valve regurgitation is not visualized. No aortic stenosis is present. Pulmonic Valve: The pulmonic valve was grossly normal. Pulmonic valve regurgitation is not visualized. No evidence of pulmonic stenosis. Aorta: The aortic root and ascending aorta are structurally normal, with no evidence of dilitation. Venous: The inferior vena cava is normal in size with greater than 50% respiratory variability, suggesting right atrial pressure of 3 mmHg. IAS/Shunts: The atrial septum is grossly normal.  LEFT VENTRICLE PLAX 2D LVIDd:         2.60 cm  Diastology LVIDs:         2.10 cm  LV e' medial:    0.04 cm/s LV PW:         2.40 cm  LV E/e' medial:  20.0 LV IVS:        2.38 cm  LV e' lateral:   0.07 cm/s LVOT diam:     2.20 cm  LV E/e' lateral: 11.9 LV SV:         52 LV SV Index:   25 LVOT Area:     3.80 cm  RIGHT VENTRICLE RV Basal diam:  4.90 cm RV S prime:     9.14 cm/s TAPSE (M-mode): 1.8 cm RVSP:           31.5 mmHg LEFT ATRIUM             Index       RIGHT ATRIUM           Index LA diam:        4.20 cm 2.00 cm/m  RA Pressure: 3.00 mmHg LA Vol (A2C):   58.8 ml 28.06 ml/m RA Area:     22.30 cm LA Vol (A4C):   60.9 ml 29.06 ml/m RA Volume:   69.20 ml  33.03 ml/m LA Biplane Vol:  60.1 ml 28.68 ml/m  AORTIC VALVE LVOT Vmax:   81.60 cm/s LVOT Vmean:  58.800 cm/s LVOT VTI:    0.136 m  AORTA Ao Root diam: 3.50 cm Ao Asc diam:  3.60 cm MITRAL VALVE              TRICUSPID VALVE MV Area (PHT):            TR Peak grad:   28.5 mmHg MV Decel Time:            TR Vmax:        267.00 cm/s MV E velocity: 0.87 cm/s  Estimated RAP:  3.00 mmHg                           RVSP:           31.5 mmHg  SHUNTS                           Systemic VTI:  0.14 m                           Systemic Diam: 2.20 cm Lennie OdorWesley O'Neal MD Electronically signed by Lennie OdorWesley O'Neal MD Signature Date/Time: 05/22/2020/12:19:42 PM    Final     Assessment/Plan 1. Late onset Alzheimer's disease with behavioral disturbance (HCC) Suggested non pharm measures of redirection and re-orientation but this has not worked  Increase Seroquel to 100 mg qhs   2. Paroxysmal atrial fibrillation (HCC) Rate is controlled Followed by Cardiology Continue on Eliquis at 5 mg bid   3. Right renal stone Procedure on hold for now Will need cardiology clearance   4. Type 2 diabetes mellitus with stage 3a chronic kidney disease, without long-term current use of insulin (HCC) Diet controlled Lab Results  Component Value Date   HGBA1C 6.7 01/17/2020     5. Stage 3a chronic kidney disease (HCC) Continue to periodically monitor BMP and avoid nephrotoxic agents  Family/ staff Communication: nurse   Labs/tests ordered:  NA

## 2020-06-07 NOTE — Telephone Encounter (Signed)
James Greer of Pharm D recommendations Annice Pih asked that I have a physician review  Will forward to Dr [place] DOD tomorrow

## 2020-06-07 NOTE — Telephone Encounter (Signed)
Pt c/o medication issue:  1. Name of Medication: QUEtiapine (SEROQUEL) 50 MG tablet [[id number]]   2. How are you currently taking this medication (dosage and times per day)? 75 MG's daily   3. Are you having a reaction (difficulty breathing--STAT)? No   4. What is your medication issue? Annice Pih is calling stating the facility where James Greer is located is wanting to increase this medication to 100 MG's daily due to him becoming agitated at night. Annice Pih states this increase concerns her due to how his body has reacted to medications in the past that can lower his HR causing him to have dizzy spells and fall. She is requesting Dr. Landry Dyke opinion and recommendation on this matter. Please advise.

## 2020-06-07 NOTE — Telephone Encounter (Signed)
Quetiapine will not adversely affect his heart rate.  It can cause some orthostatic hypotension, usually just for the first few doses when starting or increasing dose.  Would recommend that they give it to him at bedtime to reduce this risk.  Also less likely to have problems with dizziness when dose given at night.

## 2020-06-07 NOTE — Telephone Encounter (Signed)
Spoke with Annice Pih, ok per DPR  She is concerned with increase of Seroquel and his history of hypotension, dizziness, and bradycardia.  Annice Pih requested Dr Landry Dyke thoughts regarding the increase, explained Dr Tresa Endo on vacation until 1/17 She requested someone review Routed to Pharm D to review

## 2020-06-08 NOTE — Telephone Encounter (Signed)
James Greer, verbalized understanding

## 2020-06-08 NOTE — Telephone Encounter (Signed)
Agree with Kristin's comments  James Greer

## 2020-06-12 ENCOUNTER — Non-Acute Institutional Stay (SKILLED_NURSING_FACILITY): Payer: Medicare HMO | Admitting: Internal Medicine

## 2020-06-12 ENCOUNTER — Encounter: Payer: Self-pay | Admitting: Internal Medicine

## 2020-06-12 ENCOUNTER — Telehealth: Payer: Self-pay

## 2020-06-12 DIAGNOSIS — F0281 Dementia in other diseases classified elsewhere with behavioral disturbance: Secondary | ICD-10-CM

## 2020-06-12 DIAGNOSIS — S92422S Displaced fracture of distal phalanx of left great toe, sequela: Secondary | ICD-10-CM

## 2020-06-12 DIAGNOSIS — N200 Calculus of kidney: Secondary | ICD-10-CM | POA: Diagnosis not present

## 2020-06-12 DIAGNOSIS — R1031 Right lower quadrant pain: Secondary | ICD-10-CM | POA: Diagnosis not present

## 2020-06-12 DIAGNOSIS — G301 Alzheimer's disease with late onset: Secondary | ICD-10-CM | POA: Diagnosis not present

## 2020-06-12 DIAGNOSIS — F02818 Dementia in other diseases classified elsewhere, unspecified severity, with other behavioral disturbance: Secondary | ICD-10-CM

## 2020-06-12 DIAGNOSIS — G8929 Other chronic pain: Secondary | ICD-10-CM

## 2020-06-12 DIAGNOSIS — M25562 Pain in left knee: Secondary | ICD-10-CM

## 2020-06-12 NOTE — Telephone Encounter (Signed)
He does not need to be in the boot anymore from my standpoint.  This is mainly just a fracture of his great toe.  At the nursing facility, may need to try to have him elevate his leg is much as possible.  This is a patient they can even be worked in on [employer] schedule.  He does not need to be seen emergently but certainly this week would be fine.

## 2020-06-12 NOTE — Telephone Encounter (Signed)
Daughter aware of the below message  

## 2020-06-12 NOTE — Telephone Encounter (Signed)
Patient daughter in law called she stated patient is in a boot and his left foot and his left leg is " extremely" swollen she is requesting a appointment asap if possible. [phone]

## 2020-06-12 NOTE — Progress Notes (Signed)
Location:  [facility] Room Number: 03-04-1934 Place of Service:  SNF (31) Provider:  Jenice Leiner L. Renato Gails, D.O., C.M.D.  Kermit Balo, DO  Patient Care Team: Kermit Balo, DO as PCP - General (Geriatric Medicine) Lennette Bihari, MD as PCP - Cardiology (Cardiology) Community, [facility] (Skilled Nursing Facility) Kermit Balo, DO (Geriatric Medicine)  Extended Emergency Contact Information Primary Emergency Contact: James Greer Address: [address]          [place], [place] [address] Greer James of [place] Home Phone: [phone] Relation: Relative Secondary Emergency Contact: [name] Address: [address]          [place], [place] [address] Greer James of [place] Mobile Phone: [phone] Relation: Son  Code Status:  DNR Goals of care: Advanced Directive information Advanced Directives 06/12/2020  Does Patient Have a Medical Advance Directive? Yes  Type of Advance Directive Out of facility DNR (pink MOST or yellow form)  Does patient want to make changes to medical advance directive? No - Patient declined  Copy of Healthcare Power of Attorney in Chart? -  Would patient like information on creating a medical advance directive? No - Patient declined  Pre-existing out of facility DNR order (yellow form or pink MOST form) Yellow form placed in chart (order not valid for inpatient use)     Chief Complaint  Patient presents with  . Acute Visit    Family demanding UA c+s and was declined by on call.     HPI:  Pt is a 87 y.o. male seen today for an acute visit for possible need for UA c+s that was declined by on call. Apparently, he'd been holding his groin area which was the reason the order was requested.  He does have a considerable h/o of nephrolithiasis.   UA c+s actually already obtained and he's on abx day 1/7 for possible UTI (keflex).  Apparently, urology gave these orders and he has a f/u with them in 10  days because of his nephrolithiasis.    His left leg has been really swollen.  Of course, he has the fx in his left great toe and possible torn meniscus at the knee.  He reports both are painful but talks as usual about other things when asked.  He would not permit me to enter his room to perform an exam.  I'd spoken with his caregiver and gotten an update before I saw him.  She noted he had some improvement in discomfort with the steroids from Dr. Prince Rome.    Past Medical History:  Diagnosis Date  . Carpal tunnel syndrome   . Chronic atrial fibrillation, unspecified (HCC)   . Chronic kidney disease   . Chronic kidney disease, stage 3a (HCC)   . Degenerative disease of nervous system (HCC)   . Hyperlipidemia   . Localized edema   . Male erectile disorder   . Other thrombophilia (HCC)   . Personal history of other (healed) physical injury and trauma   . Rhinitis, allergic   . Toe fracture, left 05/27/2020   fracture of the proximal phalanx of the left first toe  . Trigger finger   . Type 2 diabetes mellitus (HCC)   . Unspecified convulsions (HCC)   . Unspecified dementia with behavioral disturbance (HCC)   . Urolithiasis    Past Surgical History:  Procedure Laterality Date  . CRANIOTOMY     with evacuation of subdural hematoma  . EXTRACORPOREAL SHOCK  WAVE LITHOTRIPSY      No Known Allergies  Outpatient Encounter Medications as of 06/12/2020  Medication Sig  . acetaminophen (TYLENOL) 500 MG tablet Take 650 mg by mouth every 4 (four) hours as needed.  Marland Kitchen apixaban (ELIQUIS) 5 MG TABS tablet Take 1 tablet (5 mg total) by mouth 2 (two) times daily.  Marland Kitchen atorvastatin (LIPITOR) 40 MG tablet Take 40 mg by mouth daily.  . digoxin (LANOXIN) 0.125 MG tablet Take 0.125 mg by mouth daily.  . furosemide (LASIX) 20 MG tablet Take 20 mg by mouth 2 (two) times daily.  . Glucerna (GLUCERNA) LIQD Take 237 mLs by mouth 2 (two) times daily between meals.  . Multiple Vitamins-Minerals (MULTI-DAY PLUS  MINERALS PO) Take 18 mg by mouth. Iron-400 mcg-25 mcg; amt: 1 tablet; oral. Once a morning  . OVER THE COUNTER MEDICATION Take by mouth See admin instructions. 2 fiber thins with 8 ounces of water,juice or hot tea every morning.  . potassium chloride (MICRO-K) 10 MEQ CR capsule Take 10 mEq by mouth daily.  . QUEtiapine (SEROQUEL) 50 MG tablet Take 100 mg by mouth at bedtime.  . sodium fluoride (PREVIDENT 5000 PLUS) 1.1 % CREA dental cream Place 1 application onto teeth every evening.  . traZODone (DESYREL) 50 MG tablet Take 50 mg by mouth at bedtime.  . [DISCONTINUED] metoprolol tartrate (LOPRESSOR) 25 MG tablet Take 2 tablets (50 mg total) by mouth 2 (two) times daily.  . hydroxypropyl methylcellulose / hypromellose (ISOPTO TEARS / GONIOVISC) 2.5 % ophthalmic solution Place 1 drop into both eyes 3 (three) times daily as needed for dry eyes.   No facility-administered encounter medications on file as of 06/12/2020.    Review of Systems  Constitutional: Negative for chills, fever and malaise/fatigue.  HENT: Negative for congestion and sore throat.   Eyes: Negative for blurred vision.  Respiratory: Negative for cough and shortness of breath.   Cardiovascular: Positive for leg swelling. Negative for chest pain and palpitations.  Gastrointestinal: Negative for abdominal pain and constipation.  Genitourinary: Negative for dysuria, flank pain, frequency and urgency.       Groin pain  Musculoskeletal: Positive for falls and joint pain.  Skin: Negative for itching and rash.  Neurological: Negative for dizziness and loss of consciousness.  Psychiatric/Behavioral: Positive for memory loss. Negative for depression. The patient is not nervous/anxious and does not have insomnia.     Immunization History  Administered Date(s) Administered  . Influenza, High Dose Seasonal PF 03/26/2020  . Influenza-Unspecified 03/11/2011, 03/08/2012, 03/02/2013, 08/09/2013, 02/27/2016, 03/02/2017, 01/01/2019,  03/26/2020  . Moderna Sars-Covid-2 Vaccination 07/05/2019, 08/12/2019, 02/04/2020  . Pneumococcal Conjugate-13 06/13/2009  . Zoster 04/02/2010  . Zoster Recombinat (Shingrix) 05/03/2020   Pertinent  Health Maintenance Due  Topic Date Due  . FOOT EXAM  Never done  . URINE MICROALBUMIN  Never done  . PNA vac Low Risk Adult (2 of 2 - PPSV23) 06/13/2010  . HEMOGLOBIN A1C  07/19/2020  . OPHTHALMOLOGY EXAM  04/05/2021  . INFLUENZA VACCINE  Completed   Fall Risk  03/17/2020 03/14/2020  Falls in the past year? 1 1  Number falls in past yr: 1 0  Injury with Fall? 1 1  Risk for fall due to : History of fall(s);Impaired balance/gait;Impaired mobility;Mental status change;Medication side effect -  Follow up Falls evaluation completed;Education provided;Falls prevention discussed;Follow up appointment -   Functional Status Survey:    Vitals:   06/12/20 1121  BP: 130/80  Pulse: 80  Resp: 18  Temp: 97.8  F (36.6 C)  SpO2: 95%  Weight: 180 lb 3.2 oz (81.7 kg)  Height: 6\' 1"  (1.854 m)   Body mass index is 23.77 kg/m. Physical Exam Vitals reviewed.  Constitutional:      Appearance: Normal appearance.  HENT:     Head: Normocephalic and atraumatic.  Pulmonary:     Effort: Pulmonary effort is normal.  Musculoskeletal:        General: Swelling present. Normal range of motion.     Comments: Wearing orthopedic shoe on left foot  Skin:    General: Skin is warm and dry.  Neurological:     Mental Status: He is alert.     Gait: Gait abnormal.     Comments: Alert, answered his door, but stood there and would not let me in when I asked and exam was limited to checking edema and observation (left leg swollen)     Labs reviewed: Recent Labs    01/23/20 0000 02/28/20 0000 03/01/20 0000  NA 142 139  --   K 4.0 4.2  --   CL 104 100  --   CO2 31* 32*  --   BUN 24*  --   --   CREATININE 1.1 1.3  --   CALCIUM 8.8  --  9.5   Recent Labs    01/23/20 0000  AST 27  ALT 23   Recent  Labs    01/17/20 0000  HGB 13.1*  HCT 40*   Lab Results  Component Value Date   TSH 2.36 01/17/2020   Lab Results  Component Value Date   HGBA1C 6.7 01/17/2020   No results found for: CHOL, HDL, LDLCALC, LDLDIRECT, TRIG, CHOLHDL  Significant Diagnostic Results in last 30 days:  CT HEAD WO CONTRAST  Result Date: 05/27/2020 CLINICAL DATA:  Facial trauma. EXAM: CT HEAD WITHOUT CONTRAST CT CERVICAL SPINE WITHOUT CONTRAST TECHNIQUE: Multidetector CT imaging of the head and cervical spine was performed following the standard protocol without intravenous contrast. Multiplanar CT image reconstructions of the cervical spine were also generated. COMPARISON:  None. FINDINGS: CT HEAD FINDINGS Brain: There is no evidence of an acute infarct, intracranial hemorrhage, mass, midline shift, or extra-axial fluid collection. There is encephalomalacia in both temporal lobes. There is mild-to-moderate cerebral atrophy. Hypodensities in the cerebral white matter bilaterally are nonspecific but compatible with mild chronic small vessel ischemic disease. Vascular: Calcified atherosclerosis at the skull base. No hyperdense vessel. Skull: Right parietal craniotomy.  No fracture. Sinuses/Orbits: Prior functional endoscopic sinus surgery with complete opacification of the left frontal sinus and scattered polypoid mucosal thickening in the ethmoid sinuses, left maxillary sinus, and both nasal cavities suggesting polyps. Clear mastoid air cells. Bilateral cataract extraction. Other: None. CT CERVICAL SPINE FINDINGS Alignment: Trace retrolisthesis of C4 on C5. Skull base and vertebrae: No acute fracture or suspicious osseous lesion. Soft tissues and spinal canal: No prevertebral fluid or swelling. No visible canal hematoma. Disc levels: Moderate disc space narrowing from C3-4 to C5-6. Moderate multilevel facet arthrosis. Severe bilateral neural foraminal stenosis at C4-5 and moderate to severe bilateral neural foraminal  stenosis at C5-6 due to uncovertebral spurring. Upper chest: Partially visualized large right pleural effusion. Other: Mild-to-moderate calcific atherosclerosis involving the carotid arteries. IMPRESSION: 1. No evidence of acute intracranial abnormality. 2. Bilateral temporal lobe encephalomalacia suggesting remote trauma. 3. Mild chronic small vessel ischemic disease. 4. No acute cervical spine fracture. 5. Partially visualized large right pleural effusion. A chest radiograph is pending. Electronically Signed   By: Freida BusmanAllen  Mosetta Putt M.D.   On: 05/27/2020 02:42   CT CERVICAL SPINE WO CONTRAST  Result Date: 05/27/2020 CLINICAL DATA:  Facial trauma. EXAM: CT HEAD WITHOUT CONTRAST CT CERVICAL SPINE WITHOUT CONTRAST TECHNIQUE: Multidetector CT imaging of the head and cervical spine was performed following the standard protocol without intravenous contrast. Multiplanar CT image reconstructions of the cervical spine were also generated. COMPARISON:  None. FINDINGS: CT HEAD FINDINGS Brain: There is no evidence of an acute infarct, intracranial hemorrhage, mass, midline shift, or extra-axial fluid collection. There is encephalomalacia in both temporal lobes. There is mild-to-moderate cerebral atrophy. Hypodensities in the cerebral white matter bilaterally are nonspecific but compatible with mild chronic small vessel ischemic disease. Vascular: Calcified atherosclerosis at the skull base. No hyperdense vessel. Skull: Right parietal craniotomy.  No fracture. Sinuses/Orbits: Prior functional endoscopic sinus surgery with complete opacification of the left frontal sinus and scattered polypoid mucosal thickening in the ethmoid sinuses, left maxillary sinus, and both nasal cavities suggesting polyps. Clear mastoid air cells. Bilateral cataract extraction. Other: None. CT CERVICAL SPINE FINDINGS Alignment: Trace retrolisthesis of C4 on C5. Skull base and vertebrae: No acute fracture or suspicious osseous lesion. Soft tissues and  spinal canal: No prevertebral fluid or swelling. No visible canal hematoma. Disc levels: Moderate disc space narrowing from C3-4 to C5-6. Moderate multilevel facet arthrosis. Severe bilateral neural foraminal stenosis at C4-5 and moderate to severe bilateral neural foraminal stenosis at C5-6 due to uncovertebral spurring. Upper chest: Partially visualized large right pleural effusion. Other: Mild-to-moderate calcific atherosclerosis involving the carotid arteries. IMPRESSION: 1. No evidence of acute intracranial abnormality. 2. Bilateral temporal lobe encephalomalacia suggesting remote trauma. 3. Mild chronic small vessel ischemic disease. 4. No acute cervical spine fracture. 5. Partially visualized large right pleural effusion. A chest radiograph is pending. Electronically Signed   By: Sebastian Ache M.D.   On: 05/27/2020 02:42   DG Chest Port 1 View  Result Date: 05/27/2020 CLINICAL DATA:  Trauma. EXAM: PORTABLE CHEST 1 VIEW COMPARISON:  Cervical spine CT 05/27/2020 FINDINGS: The cardiac silhouette is borderline to mildly enlarged. Veiling opacity in the right hemithorax corresponds to a large pleural effusion seen on the earlier cervical spine CT. There is atelectasis or consolidation in the right lung base. Mild left basilar opacity is also noted. No pneumothorax is identified. A lateral left fifth rib fracture is favored to be chronic. IMPRESSION: Right pleural effusion and right greater than left basilar atelectasis or consolidation. Electronically Signed   By: Sebastian Ache M.D.   On: 05/27/2020 02:49   DG Foot Complete Left  Result Date: 05/27/2020 CLINICAL DATA:  Fall, pain EXAM: LEFT FOOT - COMPLETE 3+ VIEW COMPARISON:  None. FINDINGS: Minimally posterolaterally (67mm lateral) displaced,diagonal fracture through the mid-distal body the first digit proximal phalanx. No definite intra-articular extension. Moderate degenerative changes of the metatarsophalangeal joint. Bulky posterior calcaneal spur.  Plantar calcaneal spur. Associated first digit subcutaneous soft tissue edema. IMPRESSION: Extra-articular, minimallyposterolaterally displaced,acute fracture through the mid-distal body the first digit proximal phalanx Electronically Signed   By: Tish Frederickson M.D.   On: 05/27/2020 03:57   ECHOCARDIOGRAM COMPLETE  Result Date: 05/22/2020    ECHOCARDIOGRAM REPORT   Patient Name:   James Greer Date of Exam: 05/22/2020 Medical Rec #:  [id number]      Height:       75.0 in Accession #:    [id number]     Weight:       179.4 lb Date of Birth:  01-24-1934  BSA:          2.095 m Patient Age:    86 years       BP:           126/83 mmHg Patient Gender: M              HR:           63 bpm. Exam Location:  [address] Procedure: 2D Echo, Cardiac Doppler and Color Doppler Indications:    Z01.89 Pre-operative clearance  History:        Patient has no prior history of Echocardiogram examinations.                 Arrythmias:Atrial Fibrillation; Risk Factors:Dyslipidemia,                 Diabetes and HLD, CKD.  Sonographer:    Clearence Ped RCS Referring Phys: [phone] THOMAS A KELLY IMPRESSIONS  1. There is severe concentric LVH up to 2.4 cm. LV function is preserved. There is increased thickness of the RV as well and a small pericardial effusion. The patient has normal BP and no history of uncontrolled hypertension. Strain imaing not performed. Given the patient's age, and severe concentric LVH, would consider work-up for cardiac amyloidosis vs other infiltrative cardiomyopathy. Left ventricular ejection fraction, by estimation, is 65 to 70%. The left ventricle has normal function. The left ventricle has no regional wall motion abnormalities. There is severe concentric left ventricular hypertrophy. Left ventricular diastolic function could not be evaluated.  2. Right ventricular systolic function is mildly reduced. The right ventricular size is mildly enlarged. Moderately increased right ventricular wall thickness.  There is normal pulmonary artery systolic pressure. The estimated right ventricular systolic pressure is 31.5 mmHg.  3. Left atrial size was mildly dilated.  4. Right atrial size was mildly dilated.  5. A small pericardial effusion is present. The pericardial effusion is posterior to the left ventricle and the left atrium.  6. The mitral valve is grossly normal. Mild mitral valve regurgitation. No evidence of mitral stenosis.  7. The aortic valve is tricuspid. Aortic valve regurgitation is not visualized. No aortic stenosis is present.  8. The inferior vena cava is normal in size with greater than 50% respiratory variability, suggesting right atrial pressure of 3 mmHg. FINDINGS  Left Ventricle: There is severe concentric LVH up to 2.4 cm. LV function is preserved. There is increased thickness of the RV as well and a small pericardial effusion. The patient has normal BP and no history of uncontrolled hypertension. Strain imaing not performed. Given the patient's age, and severe concentric LVH, would consider work-up for cardiac amyloidosis vs other infiltrative cardiomyopathy. Left ventricular ejection fraction, by estimation, is 65 to 70%. The left ventricle has normal function. The left ventricle has no regional wall motion abnormalities. The left ventricular internal cavity size was small. There is severe concentric left ventricular hypertrophy. Left ventricular diastolic function could not be evaluated due to atrial  fibrillation. Left ventricular diastolic function could not be evaluated. Right Ventricle: The right ventricular size is mildly enlarged. Moderately increased right ventricular wall thickness. Right ventricular systolic function is mildly reduced. There is normal pulmonary artery systolic pressure. The tricuspid regurgitant velocity is 2.67 m/s, and with an assumed right atrial pressure of 3 mmHg, the estimated right ventricular systolic pressure is 31.5 mmHg. Left Atrium: Left atrial size was mildly  dilated. Right Atrium: Right atrial size was mildly dilated. Prominent Crista terminalis. Pericardium: A small pericardial effusion  is present. The pericardial effusion is posterior to the left ventricle and the left atrium. Mitral Valve: The mitral valve is grossly normal. Mild mitral valve regurgitation. No evidence of mitral valve stenosis. Tricuspid Valve: The tricuspid valve is grossly normal. Tricuspid valve regurgitation is mild . No evidence of tricuspid stenosis. Aortic Valve: The aortic valve is tricuspid. Aortic valve regurgitation is not visualized. No aortic stenosis is present. Pulmonic Valve: The pulmonic valve was grossly normal. Pulmonic valve regurgitation is not visualized. No evidence of pulmonic stenosis. Aorta: The aortic root and ascending aorta are structurally normal, with no evidence of dilitation. Venous: The inferior vena cava is normal in size with greater than 50% respiratory variability, suggesting right atrial pressure of 3 mmHg. IAS/Shunts: The atrial septum is grossly normal.  LEFT VENTRICLE PLAX 2D LVIDd:         2.60 cm  Diastology LVIDs:         2.10 cm  LV e' medial:    0.04 cm/s LV PW:         2.40 cm  LV E/e' medial:  20.0 LV IVS:        2.38 cm  LV e' lateral:   0.07 cm/s LVOT diam:     2.20 cm  LV E/e' lateral: 11.9 LV SV:         52 LV SV Index:   25 LVOT Area:     3.80 cm  RIGHT VENTRICLE RV Basal diam:  4.90 cm RV S prime:     9.14 cm/s TAPSE (M-mode): 1.8 cm RVSP:           31.5 mmHg LEFT ATRIUM             Index       RIGHT ATRIUM           Index LA diam:        4.20 cm 2.00 cm/m  RA Pressure: 3.00 mmHg LA Vol (A2C):   58.8 ml 28.06 ml/m RA Area:     22.30 cm LA Vol (A4C):   60.9 ml 29.06 ml/m RA Volume:   69.20 ml  33.03 ml/m LA Biplane Vol: 60.1 ml 28.68 ml/m  AORTIC VALVE LVOT Vmax:   81.60 cm/s LVOT Vmean:  58.800 cm/s LVOT VTI:    0.136 m  AORTA Ao Root diam: 3.50 cm Ao Asc diam:  3.60 cm MITRAL VALVE              TRICUSPID VALVE MV Area (PHT):            TR  Peak grad:   28.5 mmHg MV Decel Time:            TR Vmax:        267.00 cm/s MV E velocity: 0.87 cm/s  Estimated RAP:  3.00 mmHg                           RVSP:           31.5 mmHg                            SHUNTS                           Systemic VTI:  0.14 m  Systemic Diam: 2.20 cm Lennie OdorWesley O'Neal MD Electronically signed by Lennie OdorWesley O'Neal MD Signature Date/Time: 05/22/2020/12:19:42 PM    Final    XR Toe Great Left  Result Date: 06/14/2020 X-rays of the left great toe reveal anatomic alignment of the fracture, not a lot of callus formation yet.   Assessment/Plan 1. Right inguinal pain -may have UTI, is on keflex pending final culture which was ordered by urology so results to them  2. Right renal stone -for possible procedure to address this thru urology  3. Late onset Alzheimer's disease with behavioral disturbance (HCC) -having more challenges with agitation in late afternoons and evenings -seroquel dose had been increased to help with this 06/07/20 -he was very defensive and not interested in my visit despite my approach and attempts to convince him otherwise today  4. Closed displaced fracture of distal phalanx of left great toe, sequela -is able to come of out of orthopedic shoe but caregiver did not do this yet  5. Chronic pain of left knee -with suspected torn meniscus--he is not a good candidate for MRI or surgery -cont pain mgt   Family/ staff Communication: d/w caregiver, Gloriajean DellNadine and nurse, Bosie ClosJudith  Labs/tests ordered:  No new added by me today  Marrianne Sica L. Druscilla Petsch, D.O. Geriatrics [facility] [address] [place], [place] [address] Cell Phone (Mon-Fri 8am-5pm):  [phone] On Call:  [phone] & follow prompts after 5pm & weekends Office Phone:  [phone] Office Fax:  [phone]

## 2020-06-14 ENCOUNTER — Ambulatory Visit (INDEPENDENT_AMBULATORY_CARE_PROVIDER_SITE_OTHER): Payer: Medicare HMO | Admitting: Family Medicine

## 2020-06-14 ENCOUNTER — Ambulatory Visit (INDEPENDENT_AMBULATORY_CARE_PROVIDER_SITE_OTHER): Payer: Medicare HMO

## 2020-06-14 ENCOUNTER — Ambulatory Visit (HOSPITAL_COMMUNITY)
Admission: RE | Admit: 2020-06-14 | Discharge: 2020-06-14 | Disposition: A | Discharge status: Home / Self Care | Payer: Medicare HMO | Source: Ambulatory Visit | Attending: Family Medicine | Admitting: Family Medicine

## 2020-06-14 ENCOUNTER — Other Ambulatory Visit: Payer: Self-pay

## 2020-06-14 DIAGNOSIS — S92422A Displaced fracture of distal phalanx of left great toe, initial encounter for closed fracture: Secondary | ICD-10-CM | POA: Insufficient documentation

## 2020-06-14 DIAGNOSIS — M7989 Other specified soft tissue disorders: Secondary | ICD-10-CM

## 2020-06-14 NOTE — Progress Notes (Signed)
Office Visit Note   Patient: James Greer           Date of Birth: 1934/03/16           MRN: [id number] Visit Date: 06/14/2020 Requested by: Kermit Balo, DO [address] [place],  [place] [address] PCP: Kermit Balo, DO  Subjective: Chief Complaint  Patient presents with  . Left Knee - Follow-up, Pain    Follow up from a fall 05/27/20. Caregiver says he does rub the knee some. Not complaining of pain today.  . Left Great Toe - Follow-up, Fracture    The patient has not been complaining much about the toe/foot, per caregiver that is with him today. Ambulating with a rolling walker.    HPI: He is about 3 weeks status post left great toe proximal phalanx fracture.  Since last visit his leg became very swollen.  He was instructed to stop wearing his fracture boot.  His pain is much better, but his leg remains swollen.              ROS:   All other systems were reviewed and are negative.  Objective: Vital Signs: There were no vitals taken for this visit.  Physical Exam:  General:  Alert and oriented, in no acute distress. Pulm:  Breathing unlabored. Psy:  Normal mood, congruent affect. Skin: No skin breakdown Left foot: He is still slightly tender at the great toe proximal phalanx.  There is 2+ pitting edema in the left leg to the knee compared to trace edema in the right leg.  Imaging: XR Toe Great Left  Result Date: 06/14/2020 X-rays of the left great toe reveal anatomic alignment of the fracture, not a lot of callus formation yet.   Assessment & Plan: 1. clinically healing 3-week status post left great toe proximal phalanx fracture.  Leg swelling, at risk for DVT. -Doppler ordered to rule out DVT.  Weightbearing as tolerated, follow-up in 3 weeks for repeat x-rays of the great toe.     Procedures: No procedures performed        PMFS History: Patient Active Problem List   Diagnosis Date Noted  . Right renal stone 06/07/2020  . Permanent atrial fibrillation  (HCC) 05/29/2020  . CKD (chronic kidney disease), stage III (HCC) 05/08/2020  . Dementia (HCC) 02/28/2020  . Subdural hematoma (HCC) 02/28/2020  . Alcohol dependence (HCC) 02/28/2020  . Paroxysmal A-fib (HCC) 02/28/2020  . Secondary hypercoagulable state (HCC) 02/28/2020  . Right bundle branch block (RBBB) 02/28/2020  . Bilateral hearing loss 02/28/2020  . Right carpal tunnel syndrome 02/28/2020  . Hyperlipidemia associated with type 2 diabetes mellitus (HCC) 02/28/2020  . Type 2 diabetes mellitus with diabetic chronic kidney disease (HCC) 02/28/2020  . Benign prostatic hyperplasia with urinary frequency 02/28/2020   Past Medical History:  Diagnosis Date  . Carpal tunnel syndrome   . Chronic atrial fibrillation, unspecified (HCC)   . Chronic kidney disease   . Chronic kidney disease, stage 3a (HCC)   . Degenerative disease of nervous system (HCC)   . Hyperlipidemia   . Localized edema   . Male erectile disorder   . Other thrombophilia (HCC)   . Personal history of other (healed) physical injury and trauma   . Rhinitis, allergic   . Toe fracture, left 05/27/2020   fracture of the proximal phalanx of the left first toe  . Trigger finger   . Type 2 diabetes mellitus (HCC)   . Unspecified convulsions (HCC)   .  Unspecified dementia with behavioral disturbance (HCC)   . Urolithiasis     Family History  Problem Relation Age of Onset  . Aneurysm Sister     Past Surgical History:  Procedure Laterality Date  . CRANIOTOMY     with evacuation of subdural hematoma  . EXTRACORPOREAL SHOCK WAVE LITHOTRIPSY     Social History   Occupational History  . Not on file  Tobacco Use  . Smoking status: Never Smoker  . Smokeless tobacco: Never Used  Substance and Sexual Activity  . Alcohol use: Not Currently  . Drug use: Not Currently  . Sexual activity: Not Currently

## 2020-06-15 ENCOUNTER — Telehealth: Payer: Self-pay | Admitting: Cardiovascular Disease

## 2020-06-15 MED ORDER — METOPROLOL TARTRATE 25 MG PO TABS: 25.0000 mg | ORAL_TABLET | Freq: Two times a day (BID) | ORAL | 0 refills | Status: DC

## 2020-06-15 NOTE — Telephone Encounter (Signed)
Spoke with pt's daughter in law Annice Pih (okay per DPR). She states pt's private nurse who attends to him at [facility] has reported to her that pt's HR is going from low of 41 to high of 100 BPM and that he is experiencing increased shortness of breath. Daughter in law states pt was recently seen at afib clinic, where his medications were adjusted (cardizem DC'd, metoprolol decreased to 50 mg BID). She is inquiring if pt can be seen in the office today. Advised her that there are no available appointments this afternoon. Advised her that she may also reach back out to afib clinic to follow up on appointment availability or guidance related to medications. States she will call them. Advised pt's daughter in law that pt should be seen emergently for worsening shortness of breath or worsening HR. Verbalizes understanding.  Pt scheduled 06/19/20 with Dr. Charlean Merl at [facility].

## 2020-06-15 NOTE — Telephone Encounter (Signed)
Spoke with RN at [facility], regarding patient having HRs in the 40s with dizziness. Discussed with Rudi Coco NP will decrease metoprolol to 25mg  BID and follow up next week for assessment of response of medication change. Danielle RN as well pts daughter-in-law in agreement of plan.

## 2020-06-15 NOTE — Telephone Encounter (Signed)
STAT if HR is under 50 or over 120 (normal HR is 60-100 beats per minute)  1) What is your heart rate?  Lowest was 41 and highest 100  2) Do you have a log of your heart rate readings (document readings)? yes  3) Do you have any other symptoms? Dizzy, when he walks his heart drops and he gets dizzy

## 2020-06-18 ENCOUNTER — Ambulatory Visit: Payer: Medicare HMO | Admitting: Cardiology

## 2020-06-19 ENCOUNTER — Other Ambulatory Visit: Payer: Self-pay

## 2020-06-19 ENCOUNTER — Encounter (HOSPITAL_COMMUNITY): Payer: Self-pay | Admitting: Physician Assistant

## 2020-06-19 ENCOUNTER — Ambulatory Visit (HOSPITAL_COMMUNITY)
Admission: RE | Admit: 2020-06-19 | Discharge: 2020-06-19 | Disposition: A | Discharge status: Home / Self Care | Payer: Medicare HMO | Source: Ambulatory Visit | Attending: Physician Assistant | Admitting: Physician Assistant

## 2020-06-19 VITALS — BP 150/70 | HR 49 | Ht 73.0 in | Wt 189.8 lb

## 2020-06-19 DIAGNOSIS — E785 Hyperlipidemia, unspecified: Secondary | ICD-10-CM | POA: Insufficient documentation

## 2020-06-19 DIAGNOSIS — R001 Bradycardia, unspecified: Secondary | ICD-10-CM | POA: Insufficient documentation

## 2020-06-19 DIAGNOSIS — N1831 Chronic kidney disease, stage 3a: Secondary | ICD-10-CM | POA: Diagnosis not present

## 2020-06-19 DIAGNOSIS — D6869 Other thrombophilia: Secondary | ICD-10-CM | POA: Insufficient documentation

## 2020-06-19 DIAGNOSIS — I4821 Permanent atrial fibrillation: Secondary | ICD-10-CM | POA: Insufficient documentation

## 2020-06-19 DIAGNOSIS — Z79899 Other long term (current) drug therapy: Secondary | ICD-10-CM | POA: Insufficient documentation

## 2020-06-19 DIAGNOSIS — Z7901 Long term (current) use of anticoagulants: Secondary | ICD-10-CM | POA: Insufficient documentation

## 2020-06-19 DIAGNOSIS — I129 Hypertensive chronic kidney disease with stage 1 through stage 4 chronic kidney disease, or unspecified chronic kidney disease: Secondary | ICD-10-CM | POA: Diagnosis not present

## 2020-06-19 MED ORDER — METOPROLOL TARTRATE 25 MG PO TABS: 12.5000 mg | ORAL_TABLET | Freq: Two times a day (BID) | ORAL | 0 refills | Status: DC

## 2020-06-19 NOTE — Progress Notes (Addendum)
Primary Care Physician: Kermit Balo, DO Primary Cardiologist: Dr Tresa Endo Primary Electrophysiologist: none Referring Physician: [facility]   James Greer is a 87 y.o. male with a history of dementia, severe LVH, atrial fibrillation, HTN, CKD, HLD who presents for follow up in the [facility]. The patient was initially diagnosed with atrial fibrillation remotely and moved to [place] from [place] after the death of his wife. Patient is on Eliquis for a CHADS2VASC score of 3. He was seen by Dr Tresa Endo on 05/17/20 and he was started on diltiazem for increased rate control. On 05/27/20, he presented to the ED after a fall in the bathroom. He had lacerations on his forehead and hand. He also had a foot fracture. ECG on presentation showed afib with slow response HR 38. His metoprolol was decreased at discharge. His history is provided by his care aide Gloriajean Dell and his daughter Annice Pih.   On follow up today, patient's caregiver and family report that he has been frequently dizzy. The nurse at [facility] recorded heart rates in the 40's. His metoprolol was reduced. He continues to have dizziness on standing and his heart rates have improved minimally.   Today, he denies symptoms of palpitations, chest pain, shortness of breath, orthopnea, PND, lower extremity edema, presyncope, syncope, snoring, daytime somnolence, bleeding, or neurologic sequela. The patient is tolerating medications without difficulties and is otherwise without complaint today.    Atrial Fibrillation Risk Factors:  he does not have symptoms or diagnosis of sleep apnea. he does not have a history of rheumatic fever.   he has a BMI of Body mass index is 25.04 kg/m.Marland Kitchen Filed Weights   06/19/20 1545  Weight: 86.1 kg    Family History  Problem Relation Age of Onset  . Aneurysm Sister      Atrial Fibrillation Management history:  Previous antiarrhythmic drugs: none Previous cardioversions:  unknown  Previous ablations: none CHADS2VASC score: 3 Anticoagulation history: Eliquis   Past Medical History:  Diagnosis Date  . Carpal tunnel syndrome   . Chronic atrial fibrillation, unspecified (HCC)   . Chronic kidney disease   . Chronic kidney disease, stage 3a (HCC)   . Degenerative disease of nervous system (HCC)   . Hyperlipidemia   . Localized edema   . Male erectile disorder   . Other thrombophilia (HCC)   . Personal history of other (healed) physical injury and trauma   . Rhinitis, allergic   . Toe fracture, left 05/27/2020   fracture of the proximal phalanx of the left first toe  . Trigger finger   . Type 2 diabetes mellitus (HCC)   . Unspecified convulsions (HCC)   . Unspecified dementia with behavioral disturbance (HCC)   . Urolithiasis    Past Surgical History:  Procedure Laterality Date  . CRANIOTOMY     with evacuation of subdural hematoma  . EXTRACORPOREAL SHOCK WAVE LITHOTRIPSY      Current Outpatient Medications  Medication Sig Dispense Refill  . acetaminophen (TYLENOL) 500 MG tablet Take 650 mg by mouth every 4 (four) hours as needed.    Marland Kitchen apixaban (ELIQUIS) 5 MG TABS tablet Take 1 tablet (5 mg total) by mouth 2 (two) times daily. 60 tablet 3  . atorvastatin (LIPITOR) 40 MG tablet Take 40 mg by mouth daily.    . furosemide (LASIX) 20 MG tablet Take 20 mg by mouth 2 (two) times daily.    . Glucerna (GLUCERNA) LIQD Take 237 mLs by mouth 2 (two) times  daily between meals.    . hydroxypropyl methylcellulose / hypromellose (ISOPTO TEARS / GONIOVISC) 2.5 % ophthalmic solution Place 1 drop into both eyes 3 (three) times daily as needed for dry eyes.    . Multiple Vitamins-Minerals (MULTI-DAY PLUS MINERALS PO) Take 18 mg by mouth. Iron-400 mcg-25 mcg; amt: 1 tablet; oral. Once a morning    . OVER THE COUNTER MEDICATION Take by mouth See admin instructions. 2 fiber thins with 8 ounces of water,juice or hot tea every morning.    . potassium chloride (MICRO-K)  10 MEQ CR capsule Take 10 mEq by mouth daily.    . QUEtiapine (SEROQUEL) 50 MG tablet Take 100 mg by mouth at bedtime.    . sodium fluoride (PREVIDENT 5000 PLUS) 1.1 % CREA dental cream Place 1 application onto teeth every evening.    . traZODone (DESYREL) 50 MG tablet Take 50 mg by mouth at bedtime.    . metoprolol tartrate (LOPRESSOR) 25 MG tablet Take 0.5 tablets (12.5 mg total) by mouth 2 (two) times daily. 120 tablet 0   No current facility-administered medications for this encounter.    No Known Allergies  Social History   Socioeconomic History  . Marital status: Widowed    Spouse name: Not on file  . Number of children: 2  . Years of education: Not on file  . Highest education level: Not on file  Occupational History  . Not on file  Tobacco Use  . Smoking status: Never Smoker  . Smokeless tobacco: Never Used  Substance and Sexual Activity  . Alcohol use: Not Currently  . Drug use: Not Currently  . Sexual activity: Not Currently  Other Topics Concern  . Not on file  Social History Narrative   ** Merged History Encounter **       Social Determinants of Health   Financial Resource Strain: Not on file  Food Insecurity: Not on file  Transportation Needs: Not on file  Physical Activity: Not on file  Stress: Not on file  Social Connections: Not on file  Intimate Partner Violence: Not on file     ROS- All systems are reviewed and negative except as per the HPI above.  Physical Exam: Vitals:   06/19/20 1545  BP: (!) 150/70  Pulse: (!) 49  Weight: 86.1 kg  Height: 6\' 1"  (1.854 m)    GEN- The patient is well appearing elderly male, alert today. HEENT-head normocephalic, atraumatic, sclera clear, conjunctiva pink, hearing intact, trachea midline. Lungs- Clear to ausculation bilaterally, normal work of breathing Heart- Regular rate and rhythm, bradycardia, no murmurs, rubs or gallops  GI- soft, NT, ND, + BS Extremities- no clubbing, cyanosis, or edema MS- no  significant deformity or atrophy Skin- no rash or lesion Psych- euthymic mood, full affect Neuro- strength and sensation are intact   Wt Readings from Last 3 Encounters:  06/19/20 86.1 kg  06/12/20 81.7 kg  05/27/20 81.6 kg    EKG today demonstrates  Afib, RBBB, PVC Vent. rate 49 BPM QRS duration 132 ms QT/QTc 596/538 ms  Echo 05/22/20 demonstrated  1. There is severe concentric LVH up to 2.4 cm. LV function is preserved.  There is increased thickness of the RV as well and a small pericardial  effusion. The patient has normal BP and no history of uncontrolled  hypertension. Strain imaing not  performed. Given the patient's age, and severe concentric LVH, would  consider work-up for cardiac amyloidosis vs other infiltrative  cardiomyopathy. Left ventricular ejection fraction, by  estimation, is 65  to 70%. The left ventricle has normal function.  The left ventricle has no regional wall motion abnormalities. There is  severe concentric left ventricular hypertrophy. Left ventricular diastolic  function could not be evaluated.  2. Right ventricular systolic function is mildly reduced. The right  ventricular size is mildly enlarged. Moderately increased right  ventricular wall thickness. There is normal pulmonary artery systolic  pressure. The estimated right ventricular systolic  pressure is 31.5 mmHg.  3. Left atrial size was mildly dilated.  4. Right atrial size was mildly dilated.  5. A small pericardial effusion is present. The pericardial effusion is  posterior to the left ventricle and the left atrium.  6. The mitral valve is grossly normal. Mild mitral valve regurgitation.  No evidence of mitral stenosis.  7. The aortic valve is tricuspid. Aortic valve regurgitation is not  visualized. No aortic stenosis is present.  8. The inferior vena cava is normal in size with greater than 50%  respiratory variability, suggesting right atrial pressure of 3 mmHg.   Epic  records are reviewed at length today  CHA2DS2-VASc Score = 3  The patient's score is based upon: CHF History: No HTN History: Yes Diabetes History: No Stroke History: No Vascular Disease History: No Age Score: 2 Gender Score: 0      ASSESSMENT AND PLAN: 1. Permanent atrial fibrillation  The patient's CHA2DS2-VASc score is 3, indicating a 3.2% annual risk of stroke.   Patient in afib with slow ventricular response. Will stop digoxin. Decrease metoprolol 12.5 mg BID Would favor conservative rate control given h/o significant bradycardia. Continue Eliquis 5 mg BID (weight >60 kg, Cr < 1.5)  2. Secondary Hypercoagulable State (ICD10:  D68.69) The patient is at significant risk for stroke/thromboembolism based upon his CHA2DS2-VASc Score of 3.  Continue Apixaban (Eliquis).   3. HTN H/o hypotension and falls per daughter.   4. Bradycardia Patient having symptoms of dizziness. Med changes as above.   Follow up with Judy Pimple as scheduled.    Jorja Loa PA-C [facility] [address] [place], [place] [address] [phone] 06/19/2020 4:25 PM

## 2020-06-20 ENCOUNTER — Telehealth: Payer: Self-pay

## 2020-06-20 NOTE — Telephone Encounter (Signed)
[employer]  Called to ask for an authorization for his appointment on 04/05/2020 with them I will ask Misty Stanley because I'm not sure that we give prior authorization for this.

## 2020-06-20 NOTE — Telephone Encounter (Signed)
This pt is memory care SNF at [facility] & I recvd call from another facility providing services(within [facility]) for Mr Ayub & Francine Graven is asking for auth from [place] as well.  We don't have the info/auth needed at [facility] office. [facility] should be able to provide what info\clinical documentation [employer] needs for services since pt is not ambulatory.  Thanks, Rosezella [place]

## 2020-06-20 NOTE — Telephone Encounter (Signed)
I called the [employer] back and spoke with Lequita Halt and informed her of the message from Dr. Renato Gails and she said she would reach out to [facility] for the information needed to bill patient.

## 2020-06-21 ENCOUNTER — Telehealth: Payer: Self-pay | Admitting: Cardiovascular Disease

## 2020-06-21 NOTE — Telephone Encounter (Signed)
New Patient   Pt is requesting to switch provider from Dr. Tresa Endo to Dr. Antoine Poche.

## 2020-06-21 NOTE — Telephone Encounter (Signed)
If this is because he needs to see me in [place] I would be fine with it.  Otherwise, I don't know if there is anything that I can add to this.

## 2020-06-22 ENCOUNTER — Ambulatory Visit: Payer: Medicare HMO | Admitting: Medical

## 2020-06-22 ENCOUNTER — Telehealth (INDEPENDENT_AMBULATORY_CARE_PROVIDER_SITE_OTHER): Payer: Medicare HMO | Admitting: Medical

## 2020-06-22 ENCOUNTER — Encounter: Payer: Self-pay | Admitting: Adult Health

## 2020-06-22 ENCOUNTER — Telehealth: Payer: Self-pay | Admitting: Cardiovascular Disease

## 2020-06-22 ENCOUNTER — Telehealth: Payer: Self-pay

## 2020-06-22 ENCOUNTER — Encounter: Payer: Self-pay | Admitting: Medical

## 2020-06-22 ENCOUNTER — Non-Acute Institutional Stay (SKILLED_NURSING_FACILITY): Payer: Medicare HMO | Admitting: Adult Health

## 2020-06-22 VITALS — BP 150/80 | HR 50 | Ht 73.0 in | Wt 189.2 lb

## 2020-06-22 DIAGNOSIS — R042 Hemoptysis: Secondary | ICD-10-CM

## 2020-06-22 DIAGNOSIS — I10 Essential (primary) hypertension: Secondary | ICD-10-CM | POA: Diagnosis not present

## 2020-06-22 DIAGNOSIS — I5033 Acute on chronic diastolic (congestive) heart failure: Secondary | ICD-10-CM | POA: Diagnosis not present

## 2020-06-22 DIAGNOSIS — R0602 Shortness of breath: Secondary | ICD-10-CM

## 2020-06-22 DIAGNOSIS — I509 Heart failure, unspecified: Secondary | ICD-10-CM

## 2020-06-22 DIAGNOSIS — I4821 Permanent atrial fibrillation: Secondary | ICD-10-CM | POA: Diagnosis not present

## 2020-06-22 DIAGNOSIS — N1831 Chronic kidney disease, stage 3a: Secondary | ICD-10-CM

## 2020-06-22 DIAGNOSIS — E785 Hyperlipidemia, unspecified: Secondary | ICD-10-CM

## 2020-06-22 NOTE — Progress Notes (Signed)
Virtual Visit via Telephone Note   This visit type was conducted due to national recommendations for restrictions regarding the COVID-19 Pandemic (e.g. social distancing) in an effort to limit this patient's exposure and mitigate transmission in our community.  Due to his co-morbid illnesses, this patient is at least at moderate risk for complications without adequate follow up.  This format is felt to be most appropriate for this patient at this time.  The patient did not have access to video technology/had technical difficulties with video requiring transitioning to audio format only (telephone).  All issues noted in this document were discussed and addressed.  No physical exam could be performed with this format.  Please refer to the patient's chart for his  consent to telehealth for [facility].    Date:  06/23/2020   ID:  James Greer, DOB 1933/06/27, MRN [id number] The patient was identified using 2 identifiers.  Patient Location: Skilled Nursing Facility Provider Location: Office/Clinic  PCP:  Kermit Balo, DO  Cardiologist:  Nicki Guadalajara, MD  Electrophysiologist:  None   Evaluation Performed:  Follow-Up Visit  Chief Complaint:  SOB and LE edema  History of Present Illness:    James Greer is a 87 y.o. male with a PMH of permanent atrial fibrillation, HTN, HLD, severe LVH, CKD stage 3, and dementia, who presents today via telephone for the evaluation of SOB and LE edema.  He was last evaluated by cardiology at an outpatient visit with Jorja Loa, PA-C in the Afib clinic 06/19/20, at which time he had some recent dizziness and bradycardia, for which his metoprolol was reduced to 12.5mg  BID and his digoxin was discontinued. Prior to this, he was seen by Dr. Tresa Endo 05/17/20 for preoperative evaluation and recommended to undergo a repeat echocardiogram which showed severe LVH, EF 65-70%, no RWMA, mildly reduced RV systolic function, mild biatrial enlargement, a small  pericardial effusion, and mild MR. He was cleared for his surgery at that time. There is no prior ischemic evaluation available to review in our system.   Telephone visit conducted today with patient's daughter-in-law, Annice Pih, who is closely involved with his care. Patient resides at [facility] care center. She reports for the past week he has been experiencing increased SOB. She has noticed increased work of breathing. Staff have noticed increased coughing in the morning. He was noted to have some blood tinged sputum this morning. She states the aid that stayed overnight with him last night noticed he was very restless and frequently got up. She also notes increased LE edema - she has appreciated pitting in his legs. No clear chest pain complaints. She denies recent fevers. No complaints of hematuria or hematochezia  The patient does have symptoms concerning for COVID-19 infection (fever, chills, cough, or new shortness of breath).    Past Medical History:  Diagnosis Date  . Carpal tunnel syndrome   . Chronic atrial fibrillation, unspecified (HCC)   . Chronic kidney disease   . Chronic kidney disease, stage 3a (HCC)   . Degenerative disease of nervous system (HCC)   . Hyperlipidemia   . Localized edema   . Male erectile disorder   . Other thrombophilia (HCC)   . Personal history of other (healed) physical injury and trauma   . Rhinitis, allergic   . Toe fracture, left 05/27/2020   fracture of the proximal phalanx of the left first toe  . Trigger finger   . Type 2 diabetes mellitus (HCC)   . Unspecified convulsions (HCC)   .  Unspecified dementia with behavioral disturbance (HCC)   . Urolithiasis    Past Surgical History:  Procedure Laterality Date  . CRANIOTOMY     with evacuation of subdural hematoma  . EXTRACORPOREAL SHOCK WAVE LITHOTRIPSY       Current Meds  Medication Sig  . acetaminophen (TYLENOL) 500 MG tablet Take 650 mg by mouth in the morning, at noon, in the  evening, and at bedtime.  Marland Kitchen apixaban (ELIQUIS) 5 MG TABS tablet Take 1 tablet (5 mg total) by mouth 2 (two) times daily.  Marland Kitchen atorvastatin (LIPITOR) 40 MG tablet Take 40 mg by mouth daily.  . furosemide (LASIX) 20 MG tablet Take 20 mg by mouth 2 (two) times daily.  . Glucerna (GLUCERNA) LIQD Take 237 mLs by mouth 2 (two) times daily between meals.  . hydroxypropyl methylcellulose / hypromellose (ISOPTO TEARS / GONIOVISC) 2.5 % ophthalmic solution Place 1 drop into both eyes 3 (three) times daily as needed for dry eyes.  . Multiple Vitamins-Minerals (MULTI-DAY PLUS MINERALS PO) Take 18 mg by mouth. Iron-400 mcg-25 mcg; amt: 1 tablet; oral. Once a morning  . OVER THE COUNTER MEDICATION Take by mouth See admin instructions. 2 fiber thins with 8 ounces of water,juice or hot tea every morning.  . potassium chloride (MICRO-K) 10 MEQ CR capsule Take 10 mEq by mouth daily.  . QUEtiapine (SEROQUEL) 50 MG tablet Take 75 mg by mouth at bedtime.  . sodium fluoride (PREVIDENT 5000 PLUS) 1.1 % CREA dental cream Place 1 application onto teeth every evening.  . traZODone (DESYREL) 50 MG tablet Take 50 mg by mouth at bedtime.  . [DISCONTINUED] metoprolol tartrate (LOPRESSOR) 25 MG tablet Take 0.5 tablets (12.5 mg total) by mouth 2 (two) times daily.     Allergies:   Patient has no known allergies.   Social History   Tobacco Use  . Smoking status: Never Smoker  . Smokeless tobacco: Never Used  Substance Use Topics  . Alcohol use: Not Currently  . Drug use: Not Currently     Family Hx: The patient's family history includes Aneurysm in his sister.  ROS:   Please see the history of present illness.     All other systems reviewed and are negative.   Prior CV studies:   The following studies were reviewed today:  Echocardiogram 05/22/2020: 1. There is severe concentric LVH up to 2.4 cm. LV function is preserved.  There is increased thickness of the RV as well and a small pericardial  effusion. The  patient has normal BP and no history of uncontrolled  hypertension. Strain imaing not  performed. Given the patient's age, and severe concentric LVH, would  consider work-up for cardiac amyloidosis vs other infiltrative  cardiomyopathy. Left ventricular ejection fraction, by estimation, is 65  to 70%. The left ventricle has normal function.  The left ventricle has no regional wall motion abnormalities. There is  severe concentric left ventricular hypertrophy. Left ventricular diastolic  function could not be evaluated.  2. Right ventricular systolic function is mildly reduced. The right  ventricular size is mildly enlarged. Moderately increased right  ventricular wall thickness. There is normal pulmonary artery systolic  pressure. The estimated right ventricular systolic  pressure is 31.5 mmHg.  3. Left atrial size was mildly dilated.  4. Right atrial size was mildly dilated.  5. A small pericardial effusion is present. The pericardial effusion is  posterior to the left ventricle and the left atrium.  6. The mitral valve is grossly normal. Mild  mitral valve regurgitation.  No evidence of mitral stenosis.  7. The aortic valve is tricuspid. Aortic valve regurgitation is not  visualized. No aortic stenosis is present.  8. The inferior vena cava is normal in size with greater than 50%  respiratory variability, suggesting right atrial pressure of 3 mmHg.   Labs/Other Tests and Data Reviewed:    EKG:  No ECG reviewed.  Recent Labs: 01/17/2020: Hemoglobin 13.1; TSH 2.36 01/23/2020: ALT 23; BUN 24 02/28/2020: Creatinine 1.3; Potassium 4.2; Sodium 139   Recent Lipid Panel No results found for: CHOL, TRIG, HDL, CHOLHDL, LDLCALC, LDLDIRECT  Wt Readings from Last 3 Encounters:  06/22/20 189 lb 3.2 oz (85.8 kg)  06/22/20 189 lb 3.2 oz (85.8 kg)  06/19/20 189 lb 12.8 oz (86.1 kg)     Risk Assessment/Calculations:    CHA2DS2-VASc Score = 4  This indicates a 4.8% annual risk of  stroke. The patient's score is based upon: CHF History: Yes HTN History: Yes Diabetes History: No Stroke History: No Vascular Disease History: No Age Score: 2 Gender Score: 0     Objective:    Vital Signs:  BP (!) 150/80   Pulse (!) 50   Ht 6\' 1"  (1.854 m)   Wt 189 lb 3.2 oz (85.8 kg)   SpO2 97%   BMI 24.96 kg/m    VITAL SIGNS:  reviewed GEN:  no acute distress RESPIRATORY:  mildly increased work of breathing  CARDIOVASCULAR:  lower extremity edema noted PSYCH:  demented  ASSESSMENT & PLAN:     1. Suspect acute on chronic diastolic CHF: patient has been having increased SOB and LE edema for the past week. This morning was reported to have some blood tinged sputum. His last echo 05/22/20 showed EF 65-70% and severe concentric LVH with findings c/w cardiac amyloidosis vs other infiltrative cardiomyopathy. No further imaging was recommended at that time, likely due to patients, age, comorbidities, and dementia. If amyloid, possible his lower heart rate over the past month has negatively impacted his cardiac output. Tough to gauge the extent of his volume overload over the phone, though O2 sats are reassuring.  - Will increased his lasix to 40mg  BID for the next 3 days and potassium to 20 mEq x3 days, then resume lasix 20mg  BID and potassium 10 mEq daily.  - Will check BMET and BNP  - Will check CXR given episode of blood tinged sputum this morning. No other complaints of bleeding. Instructed her to have a low threshold to stop anticoagulation should hemoptysis continue, as well as low threshold for ED visit if breathing is not improved with increased lasix - Would consider testing for COVID-19 as well given new onset SOB, cough with blood tinged sputum this morning, and high risk living situation.  - Will see back in clinic 06/28/20 for close follow-up. May have to transition to a virtual visit again if patient is not cooperative.   2. Permanent atrial fibrillation with slow  ventricular rate: metoprolol was decreased 06/19/20, though he continues to have low HR's (50 today) and occasional dizziness. Hold parameters in place at [place] assisted living [place] and [place], he has not received metoprolol today.  - Will stop his metoprolol at this time as he continues to have low heart rates and occasional dizziness - Continue eliquis for stroke ppx  3. HTN: BP is up a bit. Favor permissive HTN given dizziness complaints.  - Continue to monitor closely  4. HLD: no recent lipids on file - Continue  atorvastatin  5. CKD stage 3: Cr 1.3 02/2020 - Will check BMET for close monitoring of his Cr and electrolytes.         COVID-19 Education: The signs and symptoms of COVID-19 were discussed with the patient and how to seek care for testing (follow up with PCP or arrange E-visit).  The importance of social distancing was discussed today.  Time:   Today, I have spent 22 minutes with the patient with telehealth technology discussing the above problems.     Medication Adjustments/Labs and Tests Ordered: Current medicines are reviewed at length with the patient today.  Concerns regarding medicines are outlined above.   Tests Ordered: Orders Placed This Encounter  Procedures  . DG Chest 2 View    Medication Changes: No orders of the defined types were placed in this encounter.   Follow Up:  In Person in 1 week(s)  Signed, Beatriz Stallion, PA-C  06/23/2020 2:44 PM    [facility]

## 2020-06-22 NOTE — Telephone Encounter (Signed)
    James Greer would like to get follow up with the request

## 2020-06-22 NOTE — Patient Instructions (Addendum)
Medication Instructions:  Stop Metoprolol Increase Lasix 40 mg (Twice A Day for 3 Days , then Resume 20 mg Twice a Daily) Increase Potasium 20 meq (Once a Day for 3 Days, then Resume 10 meq Daily) *If you need a refill on your cardiac medications before your next appointment, please call your pharmacy*   Lab Work: No Labs If you have labs (blood work) drawn today and your tests are completely normal, you will receive your results only by: Marland Kitchen MyChart Message (if you have MyChart) OR . A paper copy in the mail If you have any lab test that is abnormal or we need to change your treatment, we will call you to review the results.   Testing/Procedures: No Testing   Follow-Up: At [facility], you and your health needs are our priority.  As part of our continuing mission to provide you with exceptional heart care, we have created designated Provider Care Teams.  These Care Teams include your primary Cardiologist (physician) and Advanced Practice Providers (APPs -  Physician Assistants and Nurse Practitioners) who all work together to provide you with the care you need, when you need it.  We recommend signing up for the patient portal called "MyChart".  Sign up information is provided on this After Visit Summary.  MyChart is used to connect with patients for Virtual Visits (Telemedicine).  Patients are able to view lab/test results, encounter notes, upcoming appointments, etc.  Non-urgent messages can be sent to your provider as well.   To learn more about what you can do with MyChart, go to [email or web address].    Your next appointment:   June 29, 2020 11:45  The format for your next appointment:   In Person  Provider:   Judy Pimple PA-C

## 2020-06-22 NOTE — Telephone Encounter (Signed)
      I went in pt's chart to see if there was a reponse to the provider switch

## 2020-06-22 NOTE — Telephone Encounter (Signed)
error 

## 2020-06-22 NOTE — Progress Notes (Signed)
[facility]   Place of Service:  SNF (31) Provider:   Peggye Leyhristy Kimbella Heisler, ANP [facility] [phone]   James Greer, James L, DO  Patient Care Team: James Greer, James L, DO as PCP - General (Geriatric Medicine) Lennette Greer, James A, MD as PCP - Cardiology (Cardiology) Community, [facility] (Skilled Nursing Facility) James Greer, James L, DO (Geriatric Medicine)  Extended Emergency Contact Information Primary Emergency Contact: James LagerGolden, James Address: [address]          [place], [place] [address] [place] Home Phone: [phone] Relation: Relative Secondary Emergency Contact: Wende CreaseGolden, James Address: [address]          [place], [place] [address] [place] Mobile Phone: [phone] Relation: Son  Code Status:  DNR Goals of care: Advanced Directive information Advanced Directives 06/12/2020  Does Patient Have a Medical Advance Directive? Yes  Type of Advance Directive Out of facility DNR (pink MOST or yellow form)  Does patient want to make changes to medical advance directive? No - Patient declined  Copy of Healthcare Power of Attorney in Chart? -  Would patient like information on creating a medical advance directive? No - Patient declined  Pre-existing out of facility DNR order (yellow form or pink MOST form) Yellow form placed in chart (order not valid for inpatient use)     Chief Complaint  Patient presents with  . Acute Visit    Coughed up sputum with blood     HPI:  Pt is a 87 y.o. male seen today for an acute visit for hemoptysis.  This morning [name] had a cough and brought up sputum with blood. This was a one time event. He has not had any chest pain. Sats are WNL at 97%.  He has dementia and can not provide a hx. His caregiver reports that he gets sob when ambulating and had to stop 2 times when going down the hall. He does not have a cough other parts of the day, just in the  morning. No fever, runny nose, or sore throat. He has edema in both legs that is worse per the caregiver and he has gained 5 lbs in the past 3 weeks.  His weight has fluctuated as low as 174 and as high as 194 in the past three months. He has afib and recent HR had been running low and metoprolol reduced. Current rate 50.   Echo was obtained for urologic procedure on 05/22/20 which showed preserved EF with severe LVH, diastolic function not evaluated   Past Medical History:  Diagnosis Date  . Carpal tunnel syndrome   . Chronic atrial fibrillation, unspecified (HCC)   . Chronic kidney disease   . Chronic kidney disease, stage 3a (HCC)   . Degenerative disease of nervous system (HCC)   . Hyperlipidemia   . Localized edema   . Male erectile disorder   . Other thrombophilia (HCC)   . Personal history of other (healed) physical injury and trauma   . Rhinitis, allergic   . Toe fracture, left 05/27/2020   fracture of the proximal phalanx of the left first toe  . Trigger finger   . Type 2 diabetes mellitus (HCC)   . Unspecified convulsions (HCC)   . Unspecified dementia with behavioral disturbance (HCC)   . Urolithiasis    Past Surgical History:  Procedure Laterality Date  . CRANIOTOMY     with evacuation of subdural hematoma  .  EXTRACORPOREAL SHOCK WAVE LITHOTRIPSY      No Known Allergies  Outpatient Encounter Medications as of 06/22/2020  Medication Sig  . acetaminophen (TYLENOL) 500 MG tablet Take 650 mg by mouth in the morning, at noon, in the evening, and at bedtime.  Marland Kitchen apixaban (ELIQUIS) 5 MG TABS tablet Take 1 tablet (5 mg total) by mouth 2 (two) times daily.  Marland Kitchen atorvastatin (LIPITOR) 40 MG tablet Take 40 mg by mouth daily.  . furosemide (LASIX) 20 MG tablet Take 20 mg by mouth 2 (two) times daily.  . Glucerna (GLUCERNA) LIQD Take 237 mLs by mouth 2 (two) times daily between meals.  . hydroxypropyl methylcellulose / hypromellose (ISOPTO TEARS / GONIOVISC) 2.5 % ophthalmic  solution Place 1 drop into both eyes 3 (three) times daily as needed for dry eyes.  . metoprolol tartrate (LOPRESSOR) 25 MG tablet Take 0.5 tablets (12.5 mg total) by mouth 2 (two) times daily.  . Multiple Vitamins-Minerals (MULTI-DAY PLUS MINERALS PO) Take 18 mg by mouth. Iron-400 mcg-25 mcg; amt: 1 tablet; oral. Once a morning  . OVER THE COUNTER MEDICATION Take by mouth See admin instructions. 2 fiber thins with 8 ounces of water,juice or hot tea every morning.  . potassium chloride (MICRO-K) 10 MEQ CR capsule Take 10 mEq by mouth daily.  . QUEtiapine (SEROQUEL) 50 MG tablet Take 75 mg by mouth at bedtime.  . sodium fluoride (PREVIDENT 5000 PLUS) 1.1 % CREA dental cream Place 1 application onto teeth every evening.  . traZODone (DESYREL) 50 MG tablet Take 50 mg by mouth at bedtime.   No facility-administered encounter medications on file as of 06/22/2020.    Review of Systems  Unable to perform ROS: Dementia    Immunization History  Administered Date(s) Administered  . Influenza, High Dose Seasonal PF 03/26/2020  . Influenza-Unspecified 03/11/2011, 03/08/2012, 03/02/2013, 08/09/2013, 02/27/2016, 03/02/2017, 01/01/2019, 03/26/2020  . Moderna Sars-Covid-2 Vaccination 07/05/2019, 08/12/2019, 02/04/2020  . Pneumococcal Conjugate-13 06/13/2009  . Zoster 04/02/2010  . Zoster Recombinat (Shingrix) 05/03/2020   Pertinent  Health Maintenance Due  Topic Date Due  . FOOT EXAM  Never done  . URINE MICROALBUMIN  Never done  . PNA vac Low Risk Adult (2 of 2 - PPSV23) 06/13/2010  . HEMOGLOBIN A1C  07/19/2020  . OPHTHALMOLOGY EXAM  04/05/2021  . INFLUENZA VACCINE  Completed   Fall Risk  03/17/2020 03/14/2020  Falls in the past year? 1 1  Number falls in past yr: 1 0  Injury with Fall? 1 1  Risk for fall due to : History of fall(s);Impaired balance/gait;Impaired mobility;Mental status change;Medication side effect -  Follow up Falls evaluation completed;Education provided;Falls prevention  discussed;Follow up appointment -   Functional Status Survey:    Vitals:   06/22/20 1313  BP: (!) 150/80  Pulse: (!) 50  Resp: 16  Temp: 97.6 F (36.4 C)  SpO2: 97%  Weight: 189 lb 3.2 oz (85.8 kg)   Body mass index is 24.96 kg/m. Physical Exam Vitals and nursing note reviewed.  Constitutional:      General: He is not in acute distress.    Appearance: He is not diaphoretic.  HENT:     Head: Normocephalic and atraumatic.     Nose: Nose normal. No congestion.     Mouth/Throat:     Mouth: Mucous membranes are moist.     Pharynx: Oropharynx is clear. No oropharyngeal exudate or posterior oropharyngeal erythema.  Eyes:     Conjunctiva/sclera: Conjunctivae normal.     Pupils: Pupils  are equal, round, and reactive to light.  Neck:     Thyroid: No thyromegaly.     Vascular: No JVD.     Trachea: No tracheal deviation.     Comments: jvd Cardiovascular:     Rate and Rhythm: Bradycardia present. Rhythm irregular.     Heart sounds: No murmur heard.   Pulmonary:     Effort: Pulmonary effort is normal. No respiratory distress.     Breath sounds: No wheezing or rales.  Abdominal:     General: Bowel sounds are normal. There is no distension.     Palpations: Abdomen is soft.     Tenderness: There is no abdominal tenderness.  Musculoskeletal:     Right lower leg: Edema (+1) present.     Left lower leg: Edema (+1) present.  Lymphadenopathy:     Cervical: No cervical adenopathy.  Skin:    General: Skin is warm and dry.  Neurological:     Mental Status: He is alert.     Comments: Alert and oriented x 1 only. MAE   Psychiatric:        Mood and Affect: Mood and affect normal.     Labs reviewed: Recent Labs    01/23/20 0000 02/28/20 0000 03/01/20 0000  NA 142 139  --   K 4.0 4.2  --   CL 104 100  --   CO2 31* 32*  --   BUN 24*  --   --   CREATININE 1.1 1.3  --   CALCIUM 8.8  --  9.5   Recent Labs    01/23/20 0000  AST 27  ALT 23   Recent Labs     01/17/20 0000  HGB 13.1*  HCT 40*   Lab Results  Component Value Date   TSH 2.36 01/17/2020   Lab Results  Component Value Date   HGBA1C 6.7 01/17/2020   No results found for: CHOL, HDL, LDLCALC, LDLDIRECT, TRIG, CHOLHDL  Significant Diagnostic Results in last 30 days:  CT HEAD WO CONTRAST  Result Date: 05/27/2020 CLINICAL DATA:  Facial trauma. EXAM: CT HEAD WITHOUT CONTRAST CT CERVICAL SPINE WITHOUT CONTRAST TECHNIQUE: Multidetector CT imaging of the head and cervical spine was performed following the standard protocol without intravenous contrast. Multiplanar CT image reconstructions of the cervical spine were also generated. COMPARISON:  None. FINDINGS: CT HEAD FINDINGS Brain: There is no evidence of an acute infarct, intracranial hemorrhage, mass, midline shift, or extra-axial fluid collection. There is encephalomalacia in both temporal lobes. There is mild-to-moderate cerebral atrophy. Hypodensities in the cerebral white matter bilaterally are nonspecific but compatible with mild chronic small vessel ischemic disease. Vascular: Calcified atherosclerosis at the skull base. No hyperdense vessel. Skull: Right parietal craniotomy.  No fracture. Sinuses/Orbits: Prior functional endoscopic sinus surgery with complete opacification of the left frontal sinus and scattered polypoid mucosal thickening in the ethmoid sinuses, left maxillary sinus, and both nasal cavities suggesting polyps. Clear mastoid air cells. Bilateral cataract extraction. Other: None. CT CERVICAL SPINE FINDINGS Alignment: Trace retrolisthesis of C4 on C5. Skull base and vertebrae: No acute fracture or suspicious osseous lesion. Soft tissues and spinal canal: No prevertebral fluid or swelling. No visible canal hematoma. Disc levels: Moderate disc space narrowing from C3-4 to C5-6. Moderate multilevel facet arthrosis. Severe bilateral neural foraminal stenosis at C4-5 and moderate to severe bilateral neural foraminal stenosis at  C5-6 due to uncovertebral spurring. Upper chest: Partially visualized large right pleural effusion. Other: Mild-to-moderate calcific atherosclerosis involving the carotid arteries. IMPRESSION:  1. No evidence of acute intracranial abnormality. 2. Bilateral temporal lobe encephalomalacia suggesting remote trauma. 3. Mild chronic small vessel ischemic disease. 4. No acute cervical spine fracture. 5. Partially visualized large right pleural effusion. A chest radiograph is pending. Electronically Signed   By: Sebastian AcheAllen  Grady M.D.   On: 05/27/2020 02:42   CT CERVICAL SPINE WO CONTRAST  Result Date: 05/27/2020 CLINICAL DATA:  Facial trauma. EXAM: CT HEAD WITHOUT CONTRAST CT CERVICAL SPINE WITHOUT CONTRAST TECHNIQUE: Multidetector CT imaging of the head and cervical spine was performed following the standard protocol without intravenous contrast. Multiplanar CT image reconstructions of the cervical spine were also generated. COMPARISON:  None. FINDINGS: CT HEAD FINDINGS Brain: There is no evidence of an acute infarct, intracranial hemorrhage, mass, midline shift, or extra-axial fluid collection. There is encephalomalacia in both temporal lobes. There is mild-to-moderate cerebral atrophy. Hypodensities in the cerebral white matter bilaterally are nonspecific but compatible with mild chronic small vessel ischemic disease. Vascular: Calcified atherosclerosis at the skull base. No hyperdense vessel. Skull: Right parietal craniotomy.  No fracture. Sinuses/Orbits: Prior functional endoscopic sinus surgery with complete opacification of the left frontal sinus and scattered polypoid mucosal thickening in the ethmoid sinuses, left maxillary sinus, and both nasal cavities suggesting polyps. Clear mastoid air cells. Bilateral cataract extraction. Other: None. CT CERVICAL SPINE FINDINGS Alignment: Trace retrolisthesis of C4 on C5. Skull base and vertebrae: No acute fracture or suspicious osseous lesion. Soft tissues and spinal canal:  No prevertebral fluid or swelling. No visible canal hematoma. Disc levels: Moderate disc space narrowing from C3-4 to C5-6. Moderate multilevel facet arthrosis. Severe bilateral neural foraminal stenosis at C4-5 and moderate to severe bilateral neural foraminal stenosis at C5-6 due to uncovertebral spurring. Upper chest: Partially visualized large right pleural effusion. Other: Mild-to-moderate calcific atherosclerosis involving the carotid arteries. IMPRESSION: 1. No evidence of acute intracranial abnormality. 2. Bilateral temporal lobe encephalomalacia suggesting remote trauma. 3. Mild chronic small vessel ischemic disease. 4. No acute cervical spine fracture. 5. Partially visualized large right pleural effusion. A chest radiograph is pending. Electronically Signed   By: Sebastian AcheAllen  Grady M.D.   On: 05/27/2020 02:42   DG Chest Port 1 View  Result Date: 05/27/2020 CLINICAL DATA:  Trauma. EXAM: PORTABLE CHEST 1 VIEW COMPARISON:  Cervical spine CT 05/27/2020 FINDINGS: The cardiac silhouette is borderline to mildly enlarged. Veiling opacity in the right hemithorax corresponds to a large pleural effusion seen on the earlier cervical spine CT. There is atelectasis or consolidation in the right lung base. Mild left basilar opacity is also noted. No pneumothorax is identified. A lateral left fifth rib fracture is favored to be chronic. IMPRESSION: Right pleural effusion and right greater than left basilar atelectasis or consolidation. Electronically Signed   By: Sebastian AcheAllen  Grady M.D.   On: 05/27/2020 02:49   DG Foot Complete Left  Result Date: 05/27/2020 CLINICAL DATA:  Fall, pain EXAM: LEFT FOOT - COMPLETE 3+ VIEW COMPARISON:  None. FINDINGS: Minimally posterolaterally (4mm lateral) displaced,diagonal fracture through the mid-distal body the first digit proximal phalanx. No definite intra-articular extension. Moderate degenerative changes of the metatarsophalangeal joint. Bulky posterior calcaneal spur. Plantar  calcaneal spur. Associated first digit subcutaneous soft tissue edema. IMPRESSION: Extra-articular, minimallyposterolaterally displaced,acute fracture through the mid-distal body the first digit proximal phalanx Electronically Signed   By: Tish FredericksonMorgane  Naveau M.D.   On: 05/27/2020 03:57   XR Toe Great Left  Result Date: 06/14/2020 X-rays of the left great toe reveal anatomic alignment of the fracture, not a lot of  callus formation yet.   Assessment/Plan 1. Hemoptysis x1 likely due to CHF His nose and throat do not show irritation. If this continues may need additional imaging but given his underlying dementia with combative behaviors risk vs benefit would need to be considered.   2. Acute congestive heart failure, unspecified heart failure type (HCC)  Symptoms are c/w CHF.  Would recommend diuresis and if no improvement pursue additional workup. He has a virtual cardiology apt at 2 and we will wait there input.     Family/ staff Communication: discussed with his nurse and she will attend the virtual cardiology apt this afternoon   Labs/tests ordered:  NA

## 2020-06-23 ENCOUNTER — Encounter: Payer: Self-pay | Admitting: Medical

## 2020-06-24 ENCOUNTER — Emergency Department (HOSPITAL_COMMUNITY): Payer: Medicare HMO

## 2020-06-24 ENCOUNTER — Telehealth: Payer: Self-pay | Admitting: Student

## 2020-06-24 ENCOUNTER — Other Ambulatory Visit: Payer: Self-pay

## 2020-06-24 ENCOUNTER — Inpatient Hospital Stay (HOSPITAL_COMMUNITY)
Admission: EM | Admit: 2020-06-24 | Discharge: 2020-06-27 | DRG: 291 | Disposition: A | Discharge status: Nursing Home (Includes ICF) | Payer: Medicare HMO | Source: Skilled Nursing Facility | Attending: Family Medicine | Admitting: Family Medicine

## 2020-06-24 DIAGNOSIS — I509 Heart failure, unspecified: Secondary | ICD-10-CM

## 2020-06-24 DIAGNOSIS — E1122 Type 2 diabetes mellitus with diabetic chronic kidney disease: Secondary | ICD-10-CM | POA: Diagnosis present

## 2020-06-24 DIAGNOSIS — R001 Bradycardia, unspecified: Secondary | ICD-10-CM | POA: Diagnosis present

## 2020-06-24 DIAGNOSIS — I5033 Acute on chronic diastolic (congestive) heart failure: Secondary | ICD-10-CM | POA: Diagnosis present

## 2020-06-24 DIAGNOSIS — I130 Hypertensive heart and chronic kidney disease with heart failure and stage 1 through stage 4 chronic kidney disease, or unspecified chronic kidney disease: Principal | ICD-10-CM | POA: Diagnosis present

## 2020-06-24 DIAGNOSIS — I4510 Unspecified right bundle-branch block: Secondary | ICD-10-CM | POA: Diagnosis present

## 2020-06-24 DIAGNOSIS — I248 Other forms of acute ischemic heart disease: Secondary | ICD-10-CM | POA: Diagnosis present

## 2020-06-24 DIAGNOSIS — Z79899 Other long term (current) drug therapy: Secondary | ICD-10-CM

## 2020-06-24 DIAGNOSIS — E785 Hyperlipidemia, unspecified: Secondary | ICD-10-CM | POA: Diagnosis present

## 2020-06-24 DIAGNOSIS — Z20822 Contact with and (suspected) exposure to covid-19: Secondary | ICD-10-CM | POA: Diagnosis present

## 2020-06-24 DIAGNOSIS — I5043 Acute on chronic combined systolic (congestive) and diastolic (congestive) heart failure: Secondary | ICD-10-CM | POA: Diagnosis present

## 2020-06-24 DIAGNOSIS — N1831 Chronic kidney disease, stage 3a: Secondary | ICD-10-CM | POA: Diagnosis not present

## 2020-06-24 DIAGNOSIS — F0391 Unspecified dementia with behavioral disturbance: Secondary | ICD-10-CM | POA: Diagnosis present

## 2020-06-24 DIAGNOSIS — N183 Chronic kidney disease, stage 3 unspecified: Secondary | ICD-10-CM | POA: Diagnosis present

## 2020-06-24 DIAGNOSIS — I4821 Permanent atrial fibrillation: Secondary | ICD-10-CM | POA: Diagnosis present

## 2020-06-24 DIAGNOSIS — Z66 Do not resuscitate: Secondary | ICD-10-CM | POA: Diagnosis present

## 2020-06-24 DIAGNOSIS — F0390 Unspecified dementia without behavioral disturbance: Secondary | ICD-10-CM | POA: Diagnosis present

## 2020-06-24 DIAGNOSIS — J9601 Acute respiratory failure with hypoxia: Secondary | ICD-10-CM | POA: Diagnosis present

## 2020-06-24 DIAGNOSIS — I5020 Unspecified systolic (congestive) heart failure: Secondary | ICD-10-CM | POA: Diagnosis not present

## 2020-06-24 DIAGNOSIS — Z7901 Long term (current) use of anticoagulants: Secondary | ICD-10-CM

## 2020-06-24 HISTORY — DX: Bradycardia, unspecified: R00.1

## 2020-06-24 LAB — CBC WITH DIFFERENTIAL/PLATELET
Abs Immature Granulocytes: 0.01 10*3/uL (ref 0.00–0.07)
Basophils Absolute: 0 10*3/uL (ref 0.0–0.1)
Basophils Relative: 0 %
Eosinophils Absolute: 0 10*3/uL (ref 0.0–0.5)
Eosinophils Relative: 1 %
HCT: 43.3 % (ref 39.0–52.0)
Hemoglobin: 14 g/dL (ref 13.0–17.0)
Immature Granulocytes: 0 %
Lymphocytes Relative: 24 %
Lymphs Abs: 1.3 10*3/uL (ref 0.7–4.0)
MCH: 31 pg (ref 26.0–34.0)
MCHC: 32.3 g/dL (ref 30.0–36.0)
MCV: 95.8 fL (ref 80.0–100.0)
Monocytes Absolute: 0.7 10*3/uL (ref 0.1–1.0)
Monocytes Relative: 14 %
Neutro Abs: 3.1 10*3/uL (ref 1.7–7.7)
Neutrophils Relative %: 61 %
Platelets: 203 10*3/uL (ref 150–400)
RBC: 4.52 MIL/uL (ref 4.22–5.81)
RDW: 16.2 % — ABNORMAL HIGH (ref 11.5–15.5)
WBC: 5.2 10*3/uL (ref 4.0–10.5)
nRBC: 0 % (ref 0.0–0.2)

## 2020-06-24 LAB — COMPREHENSIVE METABOLIC PANEL
ALT: 35 U/L (ref 0–44)
AST: 42 U/L — ABNORMAL HIGH (ref 15–41)
Albumin: 4 g/dL (ref 3.5–5.0)
Alkaline Phosphatase: 77 U/L (ref 38–126)
Anion gap: 14 (ref 5–15)
BUN: 40 mg/dL — ABNORMAL HIGH (ref 8–23)
CO2: 28 mmol/L (ref 22–32)
Calcium: 9.5 mg/dL (ref 8.9–10.3)
Chloride: 101 mmol/L (ref 98–111)
Creatinine, Ser: 1.54 mg/dL — ABNORMAL HIGH (ref 0.61–1.24)
GFR, Estimated: 44 mL/min — ABNORMAL LOW (ref >60)
Glucose, Bld: 117 mg/dL — ABNORMAL HIGH (ref 70–99)
Potassium: 4.4 mmol/L (ref 3.5–5.1)
Sodium: 143 mmol/L (ref 135–145)
Total Bilirubin: 2 mg/dL — ABNORMAL HIGH (ref 0.3–1.2)
Total Protein: 7.7 g/dL (ref 6.5–8.1)

## 2020-06-24 LAB — SARS CORONAVIRUS 2 BY RT PCR (HOSPITAL ORDER, PERFORMED IN ~~LOC~~ HOSPITAL LAB): SARS Coronavirus 2: NEGATIVE

## 2020-06-24 LAB — BRAIN NATRIURETIC PEPTIDE: B Natriuretic Peptide: 710.4 pg/mL — ABNORMAL HIGH (ref 0.0–100.0)

## 2020-06-24 LAB — TROPONIN I (HIGH SENSITIVITY): Troponin I (High Sensitivity): 133 ng/L (ref <18)

## 2020-06-24 MED ORDER — APIXABAN 2.5 MG PO TABS
2.5000 mg | ORAL_TABLET | Freq: Two times a day (BID) | ORAL | Status: DC
  Filled 2020-06-24 (×2): qty 1

## 2020-06-24 MED ORDER — SODIUM CHLORIDE 0.9% FLUSH
3.0000 mL | Freq: Two times a day (BID) | INTRAVENOUS | Status: DC
  Administered 2020-06-24 – 2020-06-27 (×6): 3 mL via INTRAVENOUS

## 2020-06-24 MED ORDER — SODIUM CHLORIDE 0.9% FLUSH: 3.0000 mL | INTRAVENOUS | Status: DC | PRN

## 2020-06-24 MED ORDER — FUROSEMIDE 10 MG/ML IJ SOLN
80.0000 mg | Freq: Once | INTRAMUSCULAR | Status: AC
  Administered 2020-06-24: 80 mg via INTRAVENOUS
  Filled 2020-06-24: qty 8

## 2020-06-24 MED ORDER — ONDANSETRON HCL 4 MG/2ML IJ SOLN: 4.0000 mg | Freq: Four times a day (QID) | INTRAMUSCULAR | Status: DC | PRN

## 2020-06-24 MED ORDER — LORAZEPAM 2 MG/ML IJ SOLN
0.5000 mg | Freq: Once | INTRAMUSCULAR | Status: AC
  Administered 2020-06-24: 0.5 mg via INTRAVENOUS
  Filled 2020-06-24: qty 1

## 2020-06-24 MED ORDER — QUETIAPINE FUMARATE 25 MG PO TABS
50.0000 mg | ORAL_TABLET | Freq: Every day | ORAL | Status: DC
  Filled 2020-06-24: qty 1

## 2020-06-24 MED ORDER — ACETAMINOPHEN 325 MG PO TABS: 650.0000 mg | ORAL_TABLET | ORAL | Status: DC | PRN

## 2020-06-24 MED ORDER — TRAZODONE HCL 50 MG PO TABS
50.0000 mg | ORAL_TABLET | Freq: Every day | ORAL | Status: DC
  Administered 2020-06-25 – 2020-06-26 (×2): 50 mg via ORAL
  Filled 2020-06-24 (×2): qty 1

## 2020-06-24 MED ORDER — ATORVASTATIN CALCIUM 40 MG PO TABS
40.0000 mg | ORAL_TABLET | Freq: Every day | ORAL | Status: DC
  Administered 2020-06-26 – 2020-06-27 (×2): 40 mg via ORAL
  Filled 2020-06-24 (×3): qty 1

## 2020-06-24 MED ORDER — TRAZODONE HCL 50 MG PO TABS
50.0000 mg | ORAL_TABLET | Freq: Every day | ORAL | Status: DC
Start: 2020-06-24 — End: 2020-06-25
  Filled 2020-06-24: qty 1

## 2020-06-24 MED ORDER — QUETIAPINE FUMARATE 25 MG PO TABS
75.0000 mg | ORAL_TABLET | Freq: Every day | ORAL | Status: DC
  Administered 2020-06-25 – 2020-06-26 (×2): 75 mg via ORAL
  Filled 2020-06-24: qty 2
  Filled 2020-06-24 (×2): qty 3

## 2020-06-24 MED ORDER — FUROSEMIDE 10 MG/ML IJ SOLN
80.0000 mg | Freq: Two times a day (BID) | INTRAMUSCULAR | Status: DC
  Administered 2020-06-25: 80 mg via INTRAVENOUS
  Filled 2020-06-24: qty 8

## 2020-06-24 MED ORDER — SODIUM CHLORIDE 0.9 % IV SOLN: 250.0000 mL | INTRAVENOUS | Status: DC | PRN

## 2020-06-24 MED ORDER — POLYVINYL ALCOHOL 1.4 % OP SOLN
1.0000 [drp] | Freq: Three times a day (TID) | OPHTHALMIC | Status: DC | PRN
  Filled 2020-06-24: qty 15

## 2020-06-24 NOTE — ED Triage Notes (Signed)
[facility]. PMH of CHF, afib, CKD, dementia, hard of hearing C/c exertional dyspnea 2+ pitting edema bilaterally in lower legs. Baseline brady d/t metoprolol Pt takes lasix normally, tried to increase dose but unrelieved fluid retention Chest xray done today that showed pleural effusion and fluid overload.

## 2020-06-24 NOTE — ED Notes (Signed)
Date and time results received: 06/24/20 2226 (use smartphrase ".now" to insert current time)  Test: Troponin Critical Value: 133  Name of Provider Notified: Gared Md Orders Received? Or Actions Taken?: waiting on rooms

## 2020-06-24 NOTE — ED Provider Notes (Signed)
[facility]-EMERGENCY DEPT Provider Note   CSN: [id number] Arrival date & time: 06/24/20  1520     History Chief Complaint  Patient presents with  . CHF exacerbation    James Greer is a 87 y.o. male.  Patient with a history of congestive heart failure.  He also has dementia.  He has been more short of breath with more swelling in his legs and was seen by cardiology who doubled up on his Lasix.  This did not help.  Cardiology sent him to the emergency department for evaluation  The history is provided by a caregiver and medical records. No language interpreter was used.  Shortness of Breath Severity:  Mild Onset quality:  Sudden Timing:  Constant Progression:  Worsening Chronicity:  Recurrent Context: activity   Relieved by:  Nothing Worsened by:  Nothing Ineffective treatments:  None tried Associated symptoms: no abdominal pain, no chest pain, no cough, no headaches and no rash        Past Medical History:  Diagnosis Date  . Carpal tunnel syndrome   . Chronic atrial fibrillation, unspecified (HCC)   . Chronic kidney disease   . Chronic kidney disease, stage 3a (HCC)   . Degenerative disease of nervous system (HCC)   . Hyperlipidemia   . Localized edema   . Male erectile disorder   . Other thrombophilia (HCC)   . Personal history of other (healed) physical injury and trauma   . Rhinitis, allergic   . Toe fracture, left 05/27/2020   fracture of the proximal phalanx of the left first toe  . Trigger finger   . Type 2 diabetes mellitus (HCC)   . Unspecified convulsions (HCC)   . Unspecified dementia with behavioral disturbance (HCC)   . Urolithiasis     Patient Active Problem List   Diagnosis Date Noted  . Right renal stone 06/07/2020  . Permanent atrial fibrillation (HCC) 05/29/2020  . CKD (chronic kidney disease), stage III (HCC) 05/08/2020  . Dementia (HCC) 02/28/2020  . Subdural hematoma (HCC) 02/28/2020  . Alcohol dependence (HCC)  02/28/2020  . Paroxysmal A-fib (HCC) 02/28/2020  . Secondary hypercoagulable state (HCC) 02/28/2020  . Right bundle branch block (RBBB) 02/28/2020  . Bilateral hearing loss 02/28/2020  . Right carpal tunnel syndrome 02/28/2020  . Hyperlipidemia associated with type 2 diabetes mellitus (HCC) 02/28/2020  . Type 2 diabetes mellitus with diabetic chronic kidney disease (HCC) 02/28/2020  . Benign prostatic hyperplasia with urinary frequency 02/28/2020    Past Surgical History:  Procedure Laterality Date  . CRANIOTOMY     with evacuation of subdural hematoma  . EXTRACORPOREAL SHOCK WAVE LITHOTRIPSY         Family History  Problem Relation Age of Onset  . Aneurysm Sister     Social History   Tobacco Use  . Smoking status: Never Smoker  . Smokeless tobacco: Never Used  Substance Use Topics  . Alcohol use: Not Currently  . Drug use: Not Currently    Home Medications Prior to Admission medications   Medication Sig Start Date End Date Taking? Authorizing Provider  acetaminophen (TYLENOL) 500 MG tablet Take 650 mg by mouth in the morning, at noon, in the evening, and at bedtime.   Yes [provider]  apixaban (ELIQUIS) 5 MG TABS tablet Take 1 tablet (5 mg total) by mouth 2 (two) times daily. 05/29/20  Yes Fenton, Clint R, PA  atorvastatin (LIPITOR) 40 MG tablet Take 40 mg by mouth daily.   Yes [provider]  furosemide (LASIX) 20 MG tablet Take 20 mg by mouth 2 (two) times daily.   Yes [provider]  furosemide (LASIX) 40 MG tablet Take 40 mg by mouth 2 (two) times daily. X 3 days, then resume order for 20 mg BID 06/22/20 06/25/20 Yes [provider]  Glucerna (GLUCERNA) LIQD Take 237 mLs by mouth 2 (two) times daily between meals.   Yes [provider]  hydroxypropyl methylcellulose / hypromellose (ISOPTO TEARS / GONIOVISC) 2.5 % ophthalmic solution Place 1 drop into both eyes 3 (three) times daily as needed for dry eyes.   Yes  [provider]  Multiple Vitamins-Minerals (MULTI-DAY PLUS MINERALS PO) Take 18 mg by mouth. Iron-400 mcg-25 mcg; amt: 1 tablet; oral. Once a morning   Yes [provider]  potassium chloride (MICRO-K) 10 MEQ CR capsule Take 10 mEq by mouth daily.   Yes [provider]  potassium chloride SA (KLOR-CON) 20 MEQ tablet Take 20 mEq by mouth daily. X 3 days then resume Klor-Con 10 MEQ once daily 06/23/20 06/26/20 Yes [provider]  QUEtiapine (SEROQUEL) 50 MG tablet Take 75 mg by mouth at bedtime. Taking 1 & 1/2 tablets= 75 mg   Yes [provider]  sodium fluoride (PREVIDENT 5000 PLUS) 1.1 % CREA dental cream Place 1 application onto teeth every evening.   Yes [provider]  traZODone (DESYREL) 50 MG tablet Take 50 mg by mouth at bedtime. For sleep   Yes [provider]  OVER THE COUNTER MEDICATION Take by mouth See admin instructions. 2 fiber thins with 8 ounces of water,juice or hot tea every morning.    [provider]    Allergies    Patient has no known allergies.  Review of Systems   Review of Systems  Constitutional: Negative for appetite change and fatigue.  HENT: Negative for congestion, ear discharge and sinus pressure.   Eyes: Negative for discharge.  Respiratory: Positive for shortness of breath. Negative for cough.   Cardiovascular: Negative for chest pain.  Gastrointestinal: Negative for abdominal pain and diarrhea.  Genitourinary: Negative for frequency and hematuria.  Musculoskeletal: Negative for back pain.  Skin: Negative for rash.  Neurological: Negative for seizures and headaches.  Psychiatric/Behavioral: Negative for hallucinations.    Physical Exam Updated Vital Signs BP 136/88   Pulse 99   Temp 98 F (36.7 C) (Axillary)   Resp 18   Ht 6\' 1"  (1.854 m)   Wt 85.8 kg   SpO2 (!) 83%   BMI 24.96 kg/m   Physical Exam Vitals and nursing note reviewed.  Constitutional:      Appearance: He  is well-developed.  HENT:     Head: Normocephalic.     Nose: Nose normal.  Eyes:     General: No scleral icterus.    Extraocular Movements: EOM normal.     Conjunctiva/sclera: Conjunctivae normal.  Neck:     Thyroid: No thyromegaly.  Cardiovascular:     Rate and Rhythm: Normal rate and regular rhythm.     Heart sounds: No murmur heard. No friction rub. No gallop.   Pulmonary:     Breath sounds: No stridor. Rales present. No wheezing.  Chest:     Chest wall: No tenderness.  Abdominal:     General: There is no distension.     Tenderness: There is no abdominal tenderness. There is no rebound.  Musculoskeletal:        General: No edema. Normal range of motion.  Cervical back: Neck supple.     Comments: 2+ edema in lower extremities  Lymphadenopathy:     Cervical: No cervical adenopathy.  Skin:    Findings: No erythema or rash.  Neurological:     Mental Status: He is alert and oriented to person, place, and time.     Motor: No abnormal muscle tone.     Coordination: Coordination normal.  Psychiatric:        Mood and Affect: Mood and affect normal.        Behavior: Behavior normal.     ED Results / Procedures / Treatments   Labs (all labs ordered are listed, but only abnormal results are displayed) Labs Reviewed  CBC WITH DIFFERENTIAL/PLATELET - Abnormal; Notable for the following components:      Result Value   RDW 16.2 (*)    All other components within normal limits  COMPREHENSIVE METABOLIC PANEL - Abnormal; Notable for the following components:   Glucose, Bld 117 (*)    BUN 40 (*)    Creatinine, Ser 1.54 (*)    AST 42 (*)    Total Bilirubin 2.0 (*)    GFR, Estimated 44 (*)    All other components within normal limits  BRAIN NATRIURETIC PEPTIDE - Abnormal; Notable for the following components:   B Natriuretic Peptide 710.4 (*)    All other components within normal limits  SARS CORONAVIRUS 2 BY RT PCR [facility](HOSPITAL ORDER[facility])     EKG EKG Interpretation  Date/Time:  Sunday June 24 2020 15:39:08 EST Ventricular Rate:  78 PR Interval:    QRS Duration: 152 QT Interval:  554 QTC Calculation: 549 R Axis:   160 Text Interpretation: Atrial fibrillation Ventricular bigeminy Right bundle branch block Probable lateral infarct, age indeterminate Probable anteroseptal infarct, old Abnrm T, consider ischemia, anterolateral lds Confirmed by Bethann BerkshireZammit, James Senn [phone]([id number]) on 06/24/2020 7:38:11 PM   Radiology DG Chest 2 View  Result Date: 06/24/2020 CLINICAL DATA:  CHF.  Shortness of breath.  Diabetes.  Dementia. EXAM: CHEST - 2 VIEW COMPARISON:  05/27/2020. FINDINGS: Both views are motion degraded. Patient rotated left on the frontal. Numerous leads and wires project over the chest. Midline trachea. Mild cardiomegaly. Layering right-sided pleural effusion may be slightly increased. No pneumothorax. Interstitial prominence and indistinctness is not significantly changed. Airspace disease throughout the lower lungs is greater right than left, slightly progressive at the right base. IMPRESSION: Congestive heart failure with slight increase in layering right pleural effusion. Worsened right base aeration, atelectasis or concurrent infection. Relatively diffuse pulmonary opacities elsewhere are likely indicative of alveolar similar alveolar edema. Electronically Signed   By: Jeronimo GreavesKyle  Talbot M.D.   On: 06/24/2020 17:04    Procedures Procedures (including critical care time)  Medications Ordered in ED Medications  traZODone (DESYREL) tablet 50 mg (has no administration in time range)  QUEtiapine (SEROQUEL) tablet 50 mg (has no administration in time range)  LORazepam (ATIVAN) injection 0.5 mg (has no administration in time range)  apixaban (ELIQUIS) tablet 5 mg (has no administration in time range)  atorvastatin (LIPITOR) tablet 40 mg (has no administration in time range)  hydroxypropyl methylcellulose / hypromellose (ISOPTO TEARS /  GONIOVISC) 2.5 % ophthalmic solution 1 drop (has no administration in time range)  traZODone (DESYREL) tablet 50 mg (has no administration in time range)  QUEtiapine (SEROQUEL) tablet 75 mg (has no administration in time range)  furosemide (LASIX) injection 80 mg (80 mg Intravenous Given 06/24/20 1727)  LORazepam (ATIVAN)  injection 0.5 mg (0.5 mg Intravenous Given 06/24/20 1814)    ED Course  I have reviewed the triage vital signs and the nursing notes.  Pertinent labs & imaging results that were available during my care of the patient were reviewed by me and considered in my medical decision making (see chart for details). Patient was given Lasix 80 mg IV.  He urinated approximately 100 cc.  I spoke to cardiology and they agreed with admission for the patient for diuresis of congestive heart failure   MDM Rules/Calculators/A&P                          Patient with worsening congestive heart failure and little urine output with 80 Lasix IV.  Chest x-ray shows heart failure and BNP is elevated.  Patient will be admitted to medicine with cardiology consult Final Clinical Impression(s) / ED Diagnoses Final diagnoses:  Systolic congestive heart failure, unspecified HF chronicity (HCC)    Rx / DC Orders ED Discharge Orders    None       Bethann Berkshire, MD 06/24/20 2018

## 2020-06-24 NOTE — Telephone Encounter (Signed)
   Received page from Answering Service that daughter-in-law Annice Pih was concerned about continued shortness of breath and bradycardia.  Reviewed chart.  Patient was recently seen by Blima Rich, PA-C on Friday 06/22/2020 at which time patient described increased shortness of breath and lower extremity edema.  Chest x-ray, BMET, and BNP were ordered.  Lasix was increased to 40 mg twice daily for the next 3 days. Krista's note mentioned a low threshold for ED visit if breathing does not improve.  Called and spoke with daughter-in-law Annice Pih.  She states that breathing has not improved with increased dose of Lasix.  Chest x-ray was done at assisted living facility and reportedly showed a pleural effusion on one side.  The RN at the assisted living facility said patient had absent breath sounds on that side as well.  Patient also continues to have bradycardia with heart rates in the 40s even after digoxin and metoprolol have been stopped. Given no improvement in breathing with increased Lasix and reports of pleural effusion on chest x-ray, recommended patient go to the ED for further evaluation. It sounds like he may need IV Lasix. Annice Pih reports she will let assisted-living facility know to call EMS.  She would like him to go to [facility] since they have fewer people waiting in the ED at the moment.  Corrin Parker, PA-C 06/24/2020 11:39 AM

## 2020-06-24 NOTE — H&P (Signed)
History and Physical    Jodie Leiner GEZ:[id number] DOB: Jul 14, 1933 DOA: 06/24/2020  PCP: Kermit Balo, DO  Patient coming from: ALF  I have personally briefly reviewed patient's old medical records in [facility]  Chief Complaint: CHF  HPI: James Greer is a 87 y.o. male with medical history significant of dCHF, A.Fib, dementia, DM2.  Pt has been more SOB with worsening peripheral edema over the past 2 weeks or so.  Seen by cardiology who doubled up on his lasix on the 21st.  He was also taken off of metoprolol completely at that time due to fairly significant bradycardia despite A.Fib.  Despite the increase in lasix, he has continued to have edema and SOB.  Sent in to ED for IV diuresis as a result.  Pt not able to answer questions at the moment due to lethargy, though per care-giver who is at bedside he apparently gets this way at night and is more interactive somewhat during the daytime.   ED Course: BNP 710.  Creat 1.5, BUN 40.  COVID neg.  CXR shows CHF findings with pleural effusion and pulm edema.  Given 80mg  IV lasix in ED.  IS having decent UOP thus far.   Review of Systems: Unable to perform due to dementia.  Past Medical History:  Diagnosis Date  . Carpal tunnel syndrome   . Chronic atrial fibrillation, unspecified (HCC)   . Chronic kidney disease   . Chronic kidney disease, stage 3a (HCC)   . Degenerative disease of nervous system (HCC)   . Hyperlipidemia   . Localized edema   . Male erectile disorder   . Other thrombophilia (HCC)   . Personal history of other (healed) physical injury and trauma   . Rhinitis, allergic   . Toe fracture, left 05/27/2020   fracture of the proximal phalanx of the left first toe  . Trigger finger   . Type 2 diabetes mellitus (HCC)   . Unspecified convulsions (HCC)   . Unspecified dementia with behavioral disturbance (HCC)   . Urolithiasis     Past Surgical History:  Procedure Laterality Date  . CRANIOTOMY      with evacuation of subdural hematoma  . EXTRACORPOREAL SHOCK WAVE LITHOTRIPSY       reports that he has never smoked. He has never used smokeless tobacco. He reports previous alcohol use. He reports previous drug use.  No Known Allergies  Family History  Problem Relation Age of Onset  . Aneurysm Sister      Prior to Admission medications   Medication Sig Start Date End Date Taking? Authorizing Provider  acetaminophen (TYLENOL) 500 MG tablet Take 650 mg by mouth in the morning, at noon, in the evening, and at bedtime.   Yes [provider]  apixaban (ELIQUIS) 5 MG TABS tablet Take 1 tablet (5 mg total) by mouth 2 (two) times daily. 05/29/20  Yes Fenton, Clint R, PA  atorvastatin (LIPITOR) 40 MG tablet Take 40 mg by mouth daily.   Yes [provider]  furosemide (LASIX) 20 MG tablet Take 20 mg by mouth 2 (two) times daily.   Yes [provider]  furosemide (LASIX) 40 MG tablet Take 40 mg by mouth 2 (two) times daily. X 3 days, then resume order for 20 mg BID 06/22/20 06/25/20 Yes [provider]  Glucerna (GLUCERNA) LIQD Take 237 mLs by mouth 2 (two) times daily between meals.   Yes [provider]  hydroxypropyl methylcellulose / hypromellose (ISOPTO TEARS / GONIOVISC)  2.5 % ophthalmic solution Place 1 drop into both eyes 3 (three) times daily as needed for dry eyes.   Yes [provider]  Multiple Vitamins-Minerals (MULTI-DAY PLUS MINERALS PO) Take 18 mg by mouth. Iron-400 mcg-25 mcg; amt: 1 tablet; oral. Once a morning   Yes [provider]  potassium chloride (MICRO-K) 10 MEQ CR capsule Take 10 mEq by mouth daily.   Yes [provider]  potassium chloride SA (KLOR-CON) 20 MEQ tablet Take 20 mEq by mouth daily. X 3 days then resume Klor-Con 10 MEQ once daily 06/23/20 06/26/20 Yes [provider]  QUEtiapine (SEROQUEL) 50 MG tablet Take 75 mg by mouth at bedtime. Taking 1 & 1/2 tablets= 75 mg   Yes [provider]  sodium fluoride (PREVIDENT 5000 PLUS) 1.1 % CREA dental cream Place 1 application onto teeth every evening.   Yes [provider]  traZODone (DESYREL) 50 MG tablet Take 50 mg by mouth at bedtime. For sleep   Yes [provider]  OVER THE COUNTER MEDICATION Take by mouth See admin instructions. 2 fiber thins with 8 ounces of water,juice or hot tea every morning.    [provider]    Physical Exam: Vitals:   06/24/20 1845 06/24/20 1915 06/24/20 1930 06/24/20 1945  BP:  (!) 154/97 (!) 151/78 136/88  Pulse: 90 (!) 50 (!) 41 99  Resp:  18 16 18   Temp:      TempSrc:      SpO2: (!) 87% 94% 93% (!) 83%  Weight:      Height:        Constitutional: NAD, calm, comfortable Eyes: PERRL, lids and conjunctivae normal ENMT: Mucous membranes are moist. Posterior pharynx clear of any exudate or lesions.Normal dentition.  Neck: normal, supple, no masses, no thyromegaly Respiratory: Rhonchi Cardiovascular: Pt very bradycardic.  IRR, IRR.  2+ BLE edema. Abdomen: no tenderness, no masses palpated. No hepatosplenomegaly. Bowel sounds positive.  Musculoskeletal: no clubbing / cyanosis. No joint deformity upper and lower extremities. Good ROM, no contractures. Normal muscle tone.  Skin: no rashes, lesions, ulcers. No induration Neurologic: MAE Psychiatric: Pt somnolent / sleeping.   Labs on Admission: I have personally reviewed following labs and imaging studies  CBC: Recent Labs  Lab 06/24/20 1611  WBC 5.2  NEUTROABS 3.1  HGB 14.0  HCT 43.3  MCV 95.8  PLT 203   Basic Metabolic Panel: Recent Labs  Lab 06/24/20 1611  NA 143  K 4.4  CL 101  CO2 28  GLUCOSE 117*  BUN 40*  CREATININE 1.54*  CALCIUM 9.5   GFR: Estimated Creatinine Clearance: 40.4 mL/min (A) (by C-G formula based on SCr of 1.54 mg/dL (H)). Liver Function Tests: Recent Labs  Lab 06/24/20 1611  AST 42*  ALT 35  ALKPHOS 77  BILITOT 2.0*  PROT 7.7  ALBUMIN 4.0   No  results for input(s): LIPASE, AMYLASE in the last 168 hours. No results for input(s): AMMONIA in the last 168 hours. Coagulation Profile: No results for input(s): INR, PROTIME in the last 168 hours. Cardiac Enzymes: No results for input(s): CKTOTAL, CKMB, CKMBINDEX, TROPONINI in the last 168 hours. BNP (last 3 results) No results for input(s): PROBNP in the last 8760 hours. HbA1C: No results for input(s): HGBA1C in the last 72 hours. CBG: No results for input(s): GLUCAP in the last 168 hours. Lipid Profile: No results for input(s): CHOL, HDL, LDLCALC, TRIG, CHOLHDL, LDLDIRECT in the last 72 hours. Thyroid Function Tests: No results  for input(s): TSH, T4TOTAL, FREET4, T3FREE, THYROIDAB in the last 72 hours. Anemia Panel: No results for input(s): VITAMINB12, FOLATE, FERRITIN, TIBC, IRON, RETICCTPCT in the last 72 hours. Urine analysis: No results found for: COLORURINE, APPEARANCEUR, LABSPEC, PHURINE, GLUCOSEU, HGBUR, BILIRUBINUR, KETONESUR, PROTEINUR, UROBILINOGEN, NITRITE, LEUKOCYTESUR  Radiological Exams on Admission: DG Chest 2 View  Result Date: 06/24/2020 CLINICAL DATA:  CHF.  Shortness of breath.  Diabetes.  Dementia. EXAM: CHEST - 2 VIEW COMPARISON:  05/27/2020. FINDINGS: Both views are motion degraded. Patient rotated left on the frontal. Numerous leads and wires project over the chest. Midline trachea. Mild cardiomegaly. Layering right-sided pleural effusion may be slightly increased. No pneumothorax. Interstitial prominence and indistinctness is not significantly changed. Airspace disease throughout the lower lungs is greater right than left, slightly progressive at the right base. IMPRESSION: Congestive heart failure with slight increase in layering right pleural effusion. Worsened right base aeration, atelectasis or concurrent infection. Relatively diffuse pulmonary opacities elsewhere are likely indicative of alveolar similar alveolar edema. Electronically Signed   By: Jeronimo Greaves  M.D.   On: 06/24/2020 17:04    EKG: Independently reviewed.  A.Fib with slow ventricular response, HR 41.  Having cardiology also take a look at it.  Assessment/Plan Principal Problem:   Acute on chronic diastolic CHF (congestive heart failure) (HCC) Active Problems:   Dementia (HCC)   Type 2 diabetes mellitus with diabetic chronic kidney disease (HCC)   CKD (chronic kidney disease), stage III (HCC)   Permanent atrial fibrillation (HCC)   Bradycardia    1. Acute on chronic diastolic CHF - 1. CHF pathway 2. Lasix 80mg  IV in ED and then BID 3. Daily weights 4. BMP daily 5. Tele monitor 6. Cards consulted by EDP 2. Bradycardia - 1. Pt no off of all rate meds, continues to be bradycardic. 2. Having cards take a look at EKG to r/o underlying 3rd degree HB / need for PPM. 1. Check trop 2. See what HR is when hes awake, if increased when awake that is more reassuring. 3. CKD 3 - 1. Monitor renal function with diruresis 4. A.Fib - 1. Cont eliquis 5. Dementia - 1. Cont home meds 2. DNR verified with caregiver 6. DM2 - 1. Looks to be diet controlled  DVT prophylaxis: Eliquis Code Status: DNR - yellow form at bedside Family Communication: Caregiver at bedside Disposition Plan: ALF after diuresis and w/u of bradycardia Consults called: Spoke with Dr. Admission status: Place in obs    Cristel Rail M. DO [facility]  How to contact the [facility] Attending or Consulting provider 7A - 7P or covering provider during after hours 7P -7A, for this patient?  1. Check the care team in [facility] and look for a) attending/consulting [facility] provider listed and b) the [facility] team listed 2. Log into [email or web address]  Amion Physician Scheduling and messaging for groups and whole hospitals  On call and physician scheduling software for group practices, residents, hospitalists and other medical providers for call, clinic, rotation and shift schedules. On[employer] is a hospital-wide system for  scheduling doctors and paging doctors on call. EasyPlot is for scientific plotting and data analysis.  [email or web address]  and use 's universal password to access. If you do not have the password, please contact the hospital operator.  3. Locate the [facility] provider you are looking for under [facility] and page to a number that you can be directly reached. 4. If you still have difficulty reaching the provider, please page the [facility] (Director  on Call) for the Hospitalists listed on amion for assistance.  06/24/2020, 9:03 PM

## 2020-06-25 ENCOUNTER — Encounter (HOSPITAL_COMMUNITY): Payer: Self-pay | Admitting: Internal Medicine

## 2020-06-25 DIAGNOSIS — J9601 Acute respiratory failure with hypoxia: Secondary | ICD-10-CM | POA: Diagnosis present

## 2020-06-25 DIAGNOSIS — I130 Hypertensive heart and chronic kidney disease with heart failure and stage 1 through stage 4 chronic kidney disease, or unspecified chronic kidney disease: Secondary | ICD-10-CM | POA: Diagnosis present

## 2020-06-25 DIAGNOSIS — I4821 Permanent atrial fibrillation: Secondary | ICD-10-CM

## 2020-06-25 DIAGNOSIS — G301 Alzheimer's disease with late onset: Secondary | ICD-10-CM | POA: Diagnosis not present

## 2020-06-25 DIAGNOSIS — I5043 Acute on chronic combined systolic (congestive) and diastolic (congestive) heart failure: Secondary | ICD-10-CM | POA: Diagnosis present

## 2020-06-25 DIAGNOSIS — F0281 Dementia in other diseases classified elsewhere with behavioral disturbance: Secondary | ICD-10-CM

## 2020-06-25 DIAGNOSIS — I5033 Acute on chronic diastolic (congestive) heart failure: Secondary | ICD-10-CM

## 2020-06-25 DIAGNOSIS — N1831 Chronic kidney disease, stage 3a: Secondary | ICD-10-CM | POA: Diagnosis present

## 2020-06-25 DIAGNOSIS — I4891 Unspecified atrial fibrillation: Secondary | ICD-10-CM | POA: Diagnosis not present

## 2020-06-25 DIAGNOSIS — Z20822 Contact with and (suspected) exposure to covid-19: Secondary | ICD-10-CM | POA: Diagnosis present

## 2020-06-25 DIAGNOSIS — E785 Hyperlipidemia, unspecified: Secondary | ICD-10-CM | POA: Diagnosis present

## 2020-06-25 DIAGNOSIS — F0391 Unspecified dementia with behavioral disturbance: Secondary | ICD-10-CM | POA: Diagnosis present

## 2020-06-25 DIAGNOSIS — R001 Bradycardia, unspecified: Secondary | ICD-10-CM | POA: Diagnosis present

## 2020-06-25 DIAGNOSIS — I4510 Unspecified right bundle-branch block: Secondary | ICD-10-CM | POA: Diagnosis present

## 2020-06-25 DIAGNOSIS — Z79899 Other long term (current) drug therapy: Secondary | ICD-10-CM | POA: Diagnosis not present

## 2020-06-25 DIAGNOSIS — Z66 Do not resuscitate: Secondary | ICD-10-CM | POA: Diagnosis present

## 2020-06-25 DIAGNOSIS — E1122 Type 2 diabetes mellitus with diabetic chronic kidney disease: Secondary | ICD-10-CM | POA: Diagnosis present

## 2020-06-25 DIAGNOSIS — I248 Other forms of acute ischemic heart disease: Secondary | ICD-10-CM | POA: Diagnosis present

## 2020-06-25 DIAGNOSIS — Z7901 Long term (current) use of anticoagulants: Secondary | ICD-10-CM | POA: Diagnosis not present

## 2020-06-25 LAB — BASIC METABOLIC PANEL
Anion gap: 11 (ref 5–15)
BUN: 36 mg/dL — ABNORMAL HIGH (ref 8–23)
CO2: 29 mmol/L (ref 22–32)
Calcium: 8.9 mg/dL (ref 8.9–10.3)
Chloride: 103 mmol/L (ref 98–111)
Creatinine, Ser: 1.49 mg/dL — ABNORMAL HIGH (ref 0.61–1.24)
GFR, Estimated: 46 mL/min — ABNORMAL LOW (ref >60)
Glucose, Bld: 101 mg/dL — ABNORMAL HIGH (ref 70–99)
Potassium: 4 mmol/L (ref 3.5–5.1)
Sodium: 143 mmol/L (ref 135–145)

## 2020-06-25 LAB — MRSA PCR SCREENING: MRSA by PCR: NEGATIVE

## 2020-06-25 LAB — TROPONIN I (HIGH SENSITIVITY): Troponin I (High Sensitivity): 119 ng/L (ref <18)

## 2020-06-25 MED ORDER — APIXABAN 5 MG PO TABS
5.0000 mg | ORAL_TABLET | Freq: Two times a day (BID) | ORAL | Status: DC
Start: 2020-06-25 — End: 2020-06-28
  Administered 2020-06-25 – 2020-06-27 (×4): 5 mg via ORAL
  Filled 2020-06-25 (×4): qty 1

## 2020-06-25 MED ORDER — HALOPERIDOL LACTATE 5 MG/ML IJ SOLN
INTRAMUSCULAR | Status: AC
  Administered 2020-06-25: 2 mg
  Filled 2020-06-25: qty 1

## 2020-06-25 MED ORDER — FUROSEMIDE 10 MG/ML IJ SOLN
80.0000 mg | Freq: Two times a day (BID) | INTRAMUSCULAR | Status: DC
  Administered 2020-06-25 – 2020-06-26 (×2): 80 mg via INTRAVENOUS
  Filled 2020-06-25 (×2): qty 8

## 2020-06-25 MED ORDER — FUROSEMIDE 10 MG/ML IJ SOLN: 40.0000 mg | Freq: Two times a day (BID) | INTRAMUSCULAR | Status: DC

## 2020-06-25 MED ORDER — BENZTROPINE MESYLATE 1 MG/ML IJ SOLN
0.5000 mg | Freq: Two times a day (BID) | INTRAMUSCULAR | Status: DC | PRN
  Filled 2020-06-25: qty 0.5

## 2020-06-25 MED ORDER — LORAZEPAM 2 MG/ML IJ SOLN
0.5000 mg | Freq: Four times a day (QID) | INTRAMUSCULAR | Status: DC | PRN
  Administered 2020-06-25 – 2020-06-26 (×3): 0.5 mg via INTRAVENOUS
  Filled 2020-06-25 (×3): qty 1

## 2020-06-25 MED ORDER — HALOPERIDOL LACTATE 5 MG/ML IJ SOLN: 2.0000 mg | INTRAMUSCULAR | Status: DC | PRN

## 2020-06-25 NOTE — Telephone Encounter (Signed)
Ok by me

## 2020-06-25 NOTE — ED Notes (Signed)
[phone] caregiver number

## 2020-06-25 NOTE — [place] FL2 (Signed)
[place] MEDICAID FL2 LEVEL OF CARE SCREENING TOOL     IDENTIFICATION  Patient Name: James Greer Birthdate: [birth year] Sex: male Admission Date (Current Location): 06/24/2020  [facility] and [place] Number:  Producer, television/film/video and Address:  [facility],  [address] [place] [address]      Provider Number: [id number]  Attending Physician Name and Address:  Almon Hercules, MD  Relative Name and Phone Number:  Annice Pih [phone]    Current Level of Care:   Recommended Level of Care: Memory Care Prior Approval Number:    Date Approved/Denied:   PASRR Number:    Discharge Plan: Other (Comment) (Memory Care Unit)    Current Diagnoses: Patient Active Problem List   Diagnosis Date Noted  . Acute on chronic diastolic CHF (congestive heart failure) (HCC) 06/24/2020  . Bradycardia 06/24/2020  . Right renal stone 06/07/2020  . Permanent atrial fibrillation (HCC) 05/29/2020  . CKD (chronic kidney disease), stage III (HCC) 05/08/2020  . Dementia (HCC) 02/28/2020  . Subdural hematoma (HCC) 02/28/2020  . Alcohol dependence (HCC) 02/28/2020  . Paroxysmal A-fib (HCC) 02/28/2020  . Secondary hypercoagulable state (HCC) 02/28/2020  . Right bundle branch block (RBBB) 02/28/2020  . Bilateral hearing loss 02/28/2020  . Right carpal tunnel syndrome 02/28/2020  . Hyperlipidemia associated with type 2 diabetes mellitus (HCC) 02/28/2020  . Type 2 diabetes mellitus with diabetic chronic kidney disease (HCC) 02/28/2020  . Benign prostatic hyperplasia with urinary frequency 02/28/2020    Orientation RESPIRATION BLADDER Height & Weight     Self    Incontinent Weight: 82.4 kg Height:  6\' 1"  (185.4 cm)  BEHAVIORAL SYMPTOMS/MOOD NEUROLOGICAL BOWEL NUTRITION STATUS      Incontinent Diet (Heart Healthy)  AMBULATORY STATUS COMMUNICATION OF NEEDS Skin   Total Care Verbally                         Personal Care Assistance Level of Assistance   Bathing,Feeding,Dressing,Total care Bathing Assistance: Maximum assistance Feeding assistance: Maximum assistance Dressing Assistance: Maximum assistance Total Care Assistance: Maximum assistance   Functional Limitations Info  Sight,Hearing,Speech Sight Info: Impaired Hearing Info: Impaired Speech Info: Impaired    SPECIAL CARE FACTORS FREQUENCY                       Contractures Contractures Info: Not present    Additional Factors Info  Code Status,Allergies (DNR) Code Status Info:  (DNR) Allergies Info:  (NKA)           Current Medications (06/25/2020):  This is the current hospital active medication list Current Facility-Administered Medications  Medication Dose Route Frequency Provider Last Rate Last Admin  . 0.9 %  sodium chloride infusion  250 mL Intravenous PRN 06/27/2020, DO      . acetaminophen (TYLENOL) tablet 650 mg  650 mg Oral Q4H PRN Hillary Bow, DO      . apixaban (ELIQUIS) tablet 5 mg  5 mg Oral BID Hillary Bow T, MD      . atorvastatin (LIPITOR) tablet 40 mg  40 mg Oral Daily Candelaria Stagers, Jared M, DO      . furosemide (LASIX) injection 80 mg  80 mg Intravenous BID Kroeger, Krista M., PA-C      . ondansetron [facility]) injection 4 mg  4 mg Intravenous Q6H PRN [facility], DO      . polyvinyl alcohol (LIQUIFILM TEARS) 1.4 % ophthalmic solution 1 drop  1 drop Both Eyes TID PRN Hillary Bow, DO      . QUEtiapine (SEROQUEL) tablet 75 mg  75 mg Oral QHS Lyda Perone M, DO      . sodium chloride flush (NS) 0.9 % injection 3 mL  3 mL Intravenous Q12H Lyda Perone M, DO   3 mL at 06/25/20 1017  . sodium chloride flush (NS) 0.9 % injection 3 mL  3 mL Intravenous PRN Hillary Bow, DO      . traZODone (DESYREL) tablet 50 mg  50 mg Oral QHS Hillary Bow, DO         Discharge Medications: Please see discharge summary for a list of discharge medications.  Relevant Imaging Results:  Relevant Lab Results:   Additional  Information [phone]  Genea Rheaume, Olegario Messier, RN

## 2020-06-25 NOTE — TOC Initial Note (Signed)
Transition of Care [facility]) - Initial/Assessment Note    Patient Details  Name: James Greer MRN: [id number] Date of Birth: [birth year]  [facility]) CM/SW Contact:    Lanier Clam, RN Phone Number: 06/25/2020, 3:10 PM  Clinical Narrative: Confirmed patient from [facility] Care Unit d/c plan to return.                  Expected Discharge Plan: Memory Care Barriers to Discharge: Continued Medical Work up   Patient Goals and CMS Choice Patient states their goals for this hospitalization and ongoing recovery are:: Return back to [facility]-memory are unit CMS Medicare.gov Compare Post Acute Care list provided to:: Patient Represenative (must comment)    Expected Discharge Plan and Services Expected Discharge Plan: Memory Care   Discharge Planning Services: CM Consult   Living arrangements for the past 2 months: Assisted Living Facility                                      Prior Living Arrangements/Services Living arrangements for the past 2 months: Assisted Living Facility Lives with:: Facility Resident Patient language and need for interpreter reviewed:: Yes        Need for Family Participation in Patient Care: No (Comment) Care giver support system in place?: Yes (comment)   Criminal Activity/Legal Involvement Pertinent to Current Situation/Hospitalization: No - Comment as needed  Activities of Daily Living Home Assistive Devices/Equipment: Eyeglasses,Grab bars around toilet,Grab bars in shower,Hand-held shower hose,Walker (specify type),Blood pressure cuff,Scales ADL Screening (condition at time of admission) Patient's cognitive ability adequate to safely complete daily activities?: No Is the patient deaf or have difficulty hearing?: Yes (HOH) Does the patient have difficulty seeing, even when wearing glasses/contacts?: No Does the patient have difficulty concentrating, remembering, or making decisions?: Yes Patient able to express need for  assistance with ADLs?: Yes Does the patient have difficulty dressing or bathing?: Yes Independently performs ADLs?: No Communication: Independent Dressing (OT): Needs assistance Is this a change from baseline?: Pre-admission baseline Grooming: Needs assistance Is this a change from baseline?: Pre-admission baseline Feeding: Needs assistance Is this a change from baseline?: Pre-admission baseline Bathing: Needs assistance Is this a change from baseline?: Pre-admission baseline Toileting: Needs assistance Is this a change from baseline?: Pre-admission baseline In/Out Bed: Needs assistance Is this a change from baseline?: Pre-admission baseline Walks in Home: Needs assistance Is this a change from baseline?: Pre-admission baseline Does the patient have difficulty walking or climbing stairs?: Yes (secondary to weakness and shortness of breath) Weakness of Legs: Both Weakness of Arms/Hands: None  Permission Sought/Granted Permission sought to share information with : Case Manager Permission granted to share information with : Yes, Verbal Permission Granted  Share Information with NAME: Case Manager     Permission granted to share info w Relationship: Annice Pih [phone]     Emotional Assessment Appearance:: Appears stated age Attitude/Demeanor/Rapport: Gracious Affect (typically observed): Accepting   Alcohol / Substance Use: Not Applicable Psych Involvement: No (comment)  Admission diagnosis:  Acute on chronic diastolic CHF (congestive heart failure) (HCC) [I50.33] Systolic congestive heart failure, unspecified HF chronicity (HCC) [I50.20] Patient Active Problem List   Diagnosis Date Noted  . Acute on chronic diastolic CHF (congestive heart failure) (HCC) 06/24/2020  . Bradycardia 06/24/2020  . Right renal stone 06/07/2020  . Permanent atrial fibrillation (HCC) 05/29/2020  . CKD (chronic kidney disease), stage III (HCC) 05/08/2020  . Dementia (  HCC) 02/28/2020  . Subdural  hematoma (HCC) 02/28/2020  . Alcohol dependence (HCC) 02/28/2020  . Paroxysmal A-fib (HCC) 02/28/2020  . Secondary hypercoagulable state (HCC) 02/28/2020  . Right bundle branch block (RBBB) 02/28/2020  . Bilateral hearing loss 02/28/2020  . Right carpal tunnel syndrome 02/28/2020  . Hyperlipidemia associated with type 2 diabetes mellitus (HCC) 02/28/2020  . Type 2 diabetes mellitus with diabetic chronic kidney disease (HCC) 02/28/2020  . Benign prostatic hyperplasia with urinary frequency 02/28/2020   PCP:  Kermit Balo, DO Pharmacy:   [facility] - [place], [place] - [id number] [place] [address] Phone: [phone] Fax: [phone]     Social Determinants of Health (SDOH) Interventions    Readmission Risk Interventions No flowsheet data found.

## 2020-06-25 NOTE — Addendum Note (Signed)
Addended by: Judy Pimple on: 06/25/2020 02:46 PM   Modules accepted: Level of Service

## 2020-06-25 NOTE — Progress Notes (Signed)
PROGRESS NOTE  James Greer TKZ:[id number] DOB: [birth year]   PCP: Kermit Balo, DO  Patient is from: ALF  DOA: 06/24/2020 LOS: 0  Chief complaints: Shortness of breath and leg swelling  Brief Narrative / Interim history: 87 year old male with PMH of dementia, permanent A. fib, diastolic CHF, CKD-3A and HTN brought to ED from ALF with dyspnea and leg swelling and admitted for acute on chronic diastolic CHF and bradycardia.  Started on IV Lasix.  Cardiology consulted given bradycardia with ventricular response, and mildly elevated troponin.  Subjective: Seen and examined earlier this morning.  No major events overnight or this morning.  Somewhat confused pulling on telemetry wires.  Bilateral mittens placed.  Oriented x0 but does not appear to be in distress.  Saturating at 95 to 100% on 2 L.  Objective: Vitals:   06/25/20 0900 06/25/20 0945 06/25/20 1105 06/25/20 1338  BP: (!) 151/98  (!) 111/97 138/82  Pulse: 78  85 (!) 41  Resp: (!) 21  19 19   Temp:   97.7 F (36.5 C) 97.6 F (36.4 C)  TempSrc:   Oral Oral  SpO2: 100% 100% 99% 94%  Weight:   82.4 kg   Height:        Intake/Output Summary (Last 24 hours) at 06/25/2020 1343 Last data filed at 06/25/2020 1300 Gross per 24 hour  Intake 237 ml  Output 700 ml  Net -463 ml   Filed Weights   06/24/20 1542 06/25/20 1105  Weight: 85.8 kg 82.4 kg    Examination:  GENERAL: No apparent distress.  Nontoxic. HEENT: MMM.  Vision and hearing grossly intact.  NECK: Supple.  JVD 22. RESP: On 2 L.  No IWOB.  Fair aeration with bilateral crackles CVS:  RRR. Heart sounds normal.  ABD/GI/GU: BS+. Abd soft, NTND.  MSK/EXT:  Moves extremities. No apparent deformity.  Trace edema bilaterally. SKIN: no apparent skin lesion or wound NEURO: Wakes to voice.  Confused and not oriented. No apparent focal neuro deficit. PSYCH: Calm. Normal affect.  Procedures:  None  Microbiology summarized: COVID-19 PCR nonreactive.  Assessment &  Plan: Acute on chronic diastolic CHF: Last TTE in 05/2020 with LVEF of 65 to 70%, severe concentric LVH concerning for infiltrative CM. He is on Lasix 40 mg daily at home.   CXR consistent with CHF.  BNP elevated to 700.  Still with clinical signs of CHF including JVD and bilateral crackles.  Concerned that his bradycardia/arrhythmia led to CHF Exacerbation..   -Appreciate input by cardiology-continue IV Lasix 80 mg twice daily -Monitor fluid status, renal function and electrolytes  Bradycardia/permanent A. fib with slow ventricular rate-was on metoprolol which was discontinued on 1/21.  Not on Aricept.  He also has RBBB.  Per cardiology, not a great candidate for PPM due to very high risk for lead dislodgment. -Continue home Eliquis and telemetry monitoring -Palliative medicine consulted.  Elevated troponin: 119>>>133.  Likely demand ischemia in the setting of CHF. -Manage CHF as above  Essential hypertension: Normotensive  CKD-3A/azotemia: Cr slightly higher than recent baseline but improving. Recent Labs    01/23/20 0000 02/28/20 0000 06/24/20 1611 06/25/20 0527  BUN 24*  --  40* 36*  CREATININE 1.1 1.3 1.54* 1.49*  -Continue diuretics as above -Continue monitoring  Hyperlipidemia -Continue home statin.  Dementia with behavioral disturbance -Reorientation and delirium precautions. -Continue home quetiapine and trazodone  DNR/DNI-appropriate  Body mass index is 23.97 kg/m.         DVT prophylaxis:  apixaban (ELIQUIS) tablet  2.5 mg Start: 06/24/20 2200 apixaban (ELIQUIS) tablet 2.5 mg  Code Status: DNR/DNI Family Communication: Updated patient's daughter-in-law, Annice Pih over the phone. Level of care: Telemetry Status is: Observation.  Remains inpatient appropriate because:Unsafe d/c plan, IV treatments appropriate due to intensity of illness or inability to take PO and Inpatient level of care appropriate due to severity of illness   Dispo: The patient is from:  ALF              Anticipated d/c is to: ALF              Anticipated d/c date is: 3 days              Patient currently is not medically stable to d/c.   Difficult to place patient No       Consultants:  Cardiology   Sch Meds:  Scheduled Meds: . apixaban  2.5 mg Oral BID  . atorvastatin  40 mg Oral Daily  . furosemide  80 mg Intravenous BID  . QUEtiapine  75 mg Oral QHS  . sodium chloride flush  3 mL Intravenous Q12H  . traZODone  50 mg Oral QHS   Continuous Infusions: . sodium chloride     PRN Meds:.sodium chloride, acetaminophen, ondansetron (ZOFRAN) IV, polyvinyl alcohol, sodium chloride flush  Antimicrobials: Anti-infectives (From admission, onward)   None       I have personally reviewed the following labs and images: CBC: Recent Labs  Lab 06/24/20 1611  WBC 5.2  NEUTROABS 3.1  HGB 14.0  HCT 43.3  MCV 95.8  PLT 203   BMP &GFR Recent Labs  Lab 06/24/20 1611 06/25/20 0527  NA 143 143  K 4.4 4.0  CL 101 103  CO2 28 29  GLUCOSE 117* 101*  BUN 40* 36*  CREATININE 1.54* 1.49*  CALCIUM 9.5 8.9   Estimated Creatinine Clearance: 41.7 mL/min (A) (by C-G formula based on SCr of 1.49 mg/dL (H)). Liver & Pancreas: Recent Labs  Lab 06/24/20 1611  AST 42*  ALT 35  ALKPHOS 77  BILITOT 2.0*  PROT 7.7  ALBUMIN 4.0   No results for input(s): LIPASE, AMYLASE in the last 168 hours. No results for input(s): AMMONIA in the last 168 hours. Diabetic: No results for input(s): HGBA1C in the last 72 hours. No results for input(s): GLUCAP in the last 168 hours. Cardiac Enzymes: No results for input(s): CKTOTAL, CKMB, CKMBINDEX, TROPONINI in the last 168 hours. No results for input(s): PROBNP in the last 8760 hours. Coagulation Profile: No results for input(s): INR, PROTIME in the last 168 hours. Thyroid Function Tests: No results for input(s): TSH, T4TOTAL, FREET4, T3FREE, THYROIDAB in the last 72 hours. Lipid Profile: No results for input(s): CHOL, HDL,  LDLCALC, TRIG, CHOLHDL, LDLDIRECT in the last 72 hours. Anemia Panel: No results for input(s): VITAMINB12, FOLATE, FERRITIN, TIBC, IRON, RETICCTPCT in the last 72 hours. Urine analysis: No results found for: COLORURINE, APPEARANCEUR, LABSPEC, PHURINE, GLUCOSEU, HGBUR, BILIRUBINUR, KETONESUR, PROTEINUR, UROBILINOGEN, NITRITE, LEUKOCYTESUR Sepsis Labs: Invalid input(s): PROCALCITONIN, LACTICIDVEN  Microbiology: Recent Results (from the past 240 hour(s))  SARS Coronavirus 2 by RT PCR (hospital order, performed in [facility] hospital lab) Nasopharyngeal Nasopharyngeal Swab     Status: None   Collection Time: 06/24/20  4:12 PM   Specimen: Nasopharyngeal Swab  Result Value Ref Range Status   SARS Coronavirus 2 NEGATIVE NEGATIVE Final    Comment: (NOTE) SARS-CoV-2 target nucleic acids are NOT DETECTED.  The SARS-CoV-2 RNA is generally detectable in upper  and lower respiratory specimens during the acute phase of infection. The lowest concentration of SARS-CoV-2 viral copies this assay can detect is 250 copies / mL. A negative result does not preclude SARS-CoV-2 infection and should not be used as the sole basis for treatment or other patient management decisions.  A negative result may occur with improper specimen collection / handling, submission of specimen other than nasopharyngeal swab, presence of viral mutation(s) within the areas targeted by this assay, and inadequate number of viral copies (<250 copies / mL). A negative result must be combined with clinical observations, patient history, and epidemiological information.  Fact Sheet for Patients:   BoilerBrush.com.cy  Fact Sheet for Healthcare Providers: https://pope.com/  This test is not yet approved or  cleared by the [place] FDA and has been authorized for detection and/or diagnosis of SARS-CoV-2 by FDA under an Emergency Use Authorization (EUA).  This EUA will remain in  effect (meaning this test can be used) for the duration of the COVID-19 declaration under Section 564(b)(1) of the Act, 21 U.S.C. section 360bbb-3(b)(1), unless the authorization is terminated or revoked sooner.  Performed at [facility], [address] [place], [place] [address]     Radiology Studies: DG Chest 2 View  Result Date: 06/24/2020 CLINICAL DATA:  CHF.  Shortness of breath.  Diabetes.  Dementia. EXAM: CHEST - 2 VIEW COMPARISON:  05/27/2020. FINDINGS: Both views are motion degraded. Patient rotated left on the frontal. Numerous leads and wires project over the chest. Midline trachea. Mild cardiomegaly. Layering right-sided pleural effusion may be slightly increased. No pneumothorax. Interstitial prominence and indistinctness is not significantly changed. Airspace disease throughout the lower lungs is greater right than left, slightly progressive at the right base. IMPRESSION: Congestive heart failure with slight increase in layering right pleural effusion. Worsened right base aeration, atelectasis or concurrent infection. Relatively diffuse pulmonary opacities elsewhere are likely indicative of alveolar similar alveolar edema. Electronically Signed   By: Jeronimo Greaves M.D.   On: 06/24/2020 17:04    Derrin Currey T. Michole Lecuyer [facility]  If 7PM-7AM, please contact night-coverage [email or web address] 06/25/2020, 1:43 PM

## 2020-06-25 NOTE — Consult Note (Addendum)
Cardiology Consultation:   Patient ID: Graceann CongressLeonard Heiman MRN: [id number]; DOB: June 04, 1933  Admit date: 06/24/2020 Date of Consult: 06/25/2020  Primary Care Provider: Kermit Baloeed, Tiffany L, DO [facility] Cardiologist: Nicki Guadalajarahomas Kelly, MD  [facility] Electrophysiologist:  None    Patient Profile:   Graceann CongressLeonard Schommer is a 87 y.o. male with a PMH of permanent atrial fibrillation, HTN, HLD, severe LVH, CKD stage 3, and dementia who is being seen today for the evaluation of CHF at the request of Dr. Julian ReilGardner.  History of Present Illness:   Mr. Renette ButtersGolden was evaluated by myself, with the assistance of his daughter-in-law Annice PihJackie, via a telephone visit 06/22/20. [facility]. She reported for the past week he has been experiencing increased SOB. She has noticed increased work of breathing. Staff have noticed increased coughing in the morning. He was noted to have some blood tinged sputum on 06/22/20. She stated the aid that stayed overnight noticed he was very restless and frequently got up. She also noted increased LE edema - she has appreciated pitting in his legs. No clear chest pain complaints. She denied recent fevers. No complaints of hematuria or hematochezia. She also mentioned persistently low heart rates despite recent discontinuation of digoxin and lowering dose of metoprolol tartrate to 12.5mg  BID following a visit with Jorja Loaicky Fenton, PA-C in the Afib clinic 06/19/20. Decision made to increase lasix from 20mg  BID to 40mg  BID and discontinue metoprolol at that time. Recommendations given for a low threshold to present to the ED should symptoms not improve.   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 He was cleared for his surgery at that time. There is no prior  ischemic evaluation available to review in our system.  [facility] he was reported to be lethargic which care-giver noted to be his baseline in the evenings. He has been generally hypertensive with HR in the 40s-60s, intermittently tachypneic and hypoxic to 83%, improved with O2 via [place], afebrile. Labs notable for K 4.4, Cr 1.5 (baseline 1.1-1.3), Hgb 14, PLT 203, Hs Trop 119>133, BNP 710. COVID-19/influenza negative. CXR suggesting CHF with increase in layering right pleural effusion. EKG initially with atrial fibrillation with rate 78 bpm, though on repeat rate was 41 bpm with chronic RBBB non-specific T wave abnormalities which are new compared to 06/19/20.  He was started on IV lasix 80mg  BID (received one dose at this time). No I&Os or weight trend documented, though weight on admit 85.8kg, down from 86.1kg at Afib clinic visit 06/19/20.   At the time of this evaluation patient is laying in bed fussing with the mittens on his hands. Per RN, patient was much calmer with care-giver at bedside, however she left earlier this morning. He is minimally cooperative with history and physical exam. Feels his breathing is find. No complaints of questions. Oriented to name only - could not tell me birth date or current location. Above history obtained from chart review.    Past Medical History:  Diagnosis Date   Carpal tunnel syndrome    Chronic atrial fibrillation, unspecified (HCC)    Chronic kidney disease    Chronic kidney disease, stage 3a (HCC)    Degenerative disease of nervous system (HCC)    Hyperlipidemia    Localized edema    Male erectile disorder  Other thrombophilia (HCC)    Personal history of other (healed) physical injury and trauma    Rhinitis, allergic    Toe fracture, left 05/27/2020   fracture of the proximal phalanx of the left first toe   Trigger finger    Type 2 diabetes mellitus (HCC)    Unspecified convulsions (HCC)    Unspecified dementia with behavioral  disturbance (HCC)    Urolithiasis     Past Surgical History:  Procedure Laterality Date   CRANIOTOMY     with evacuation of subdural hematoma   EXTRACORPOREAL SHOCK WAVE LITHOTRIPSY       Home Medications:  Prior to Admission medications   Medication Sig Start Date End Date Taking? Authorizing Provider  acetaminophen (TYLENOL) 500 MG tablet Take 650 mg by mouth in the morning, at noon, in the evening, and at bedtime.   Yes [provider]  apixaban (ELIQUIS) 5 MG TABS tablet Take 1 tablet (5 mg total) by mouth 2 (two) times daily. 05/29/20  Yes Fenton, Clint R, PA  atorvastatin (LIPITOR) 40 MG tablet Take 40 mg by mouth daily.   Yes [provider]  furosemide (LASIX) 20 MG tablet Take 20 mg by mouth 2 (two) times daily.   Yes [provider]  furosemide (LASIX) 40 MG tablet Take 40 mg by mouth 2 (two) times daily. X 3 days, then resume order for 20 mg BID 06/22/20 06/25/20 Yes [provider]  Glucerna (GLUCERNA) LIQD Take 237 mLs by mouth 2 (two) times daily between meals.   Yes [provider]  hydroxypropyl methylcellulose / hypromellose (ISOPTO TEARS / GONIOVISC) 2.5 % ophthalmic solution Place 1 drop into both eyes 3 (three) times daily as needed for dry eyes.   Yes [provider]  Multiple Vitamins-Minerals (MULTI-DAY PLUS MINERALS PO) Take 18 mg by mouth. Iron-400 mcg-25 mcg; amt: 1 tablet; oral. Once a morning   Yes [provider]  potassium chloride (MICRO-K) 10 MEQ CR capsule Take 10 mEq by mouth daily.   Yes [provider]  potassium chloride SA (KLOR-CON) 20 MEQ tablet Take 20 mEq by mouth daily. X 3 days then resume Klor-Con 10 MEQ once daily 06/23/20 06/26/20 Yes [provider]  QUEtiapine (SEROQUEL) 50 MG tablet Take 75 mg by mouth at bedtime. Taking 1 & 1/2 tablets= 75 mg   Yes [provider]  sodium fluoride (PREVIDENT 5000 PLUS) 1.1 % CREA dental cream Place 1 application onto teeth  every evening.   Yes [provider]  traZODone (DESYREL) 50 MG tablet Take 50 mg by mouth at bedtime. For sleep   Yes [provider]  OVER THE COUNTER MEDICATION Take by mouth See admin instructions. 2 fiber thins with 8 ounces of water,juice or hot tea every morning.    [provider]    Inpatient Medications: Scheduled Meds:  apixaban  2.5 mg Oral BID   atorvastatin  40 mg Oral Daily   furosemide  80 mg Intravenous BID   QUEtiapine  50 mg Oral QHS   QUEtiapine  75 mg Oral QHS   sodium chloride flush  3 mL Intravenous Q12H   traZODone  50 mg Oral QHS   traZODone  50 mg Oral QHS   Continuous Infusions:  sodium chloride     PRN Meds: sodium chloride, acetaminophen, ondansetron (ZOFRAN) IV, polyvinyl alcohol, sodium chloride flush  Allergies:   No Known Allergies  Social History:   Social History   Socioeconomic History   Marital  status: Widowed    Spouse name: Not on file   Number of children: 2   Years of education: Not on file   Highest education level: Not on file  Occupational History   Not on file  Tobacco Use   Smoking status: Never Smoker   Smokeless tobacco: Never Used  Substance and Sexual Activity   Alcohol use: Not Currently   Drug use: Not Currently   Sexual activity: Not Currently  Other Topics Concern   Not on file  Social History Narrative   ** Merged History Encounter **       Social Determinants of Health   Financial Resource Strain: Not on file  Food Insecurity: Not on file  Transportation Needs: Not on file  Physical Activity: Not on file  Stress: Not on file  Social Connections: Not on file  Intimate Partner Violence: Not on file    Family History:    Family History  Problem Relation Age of Onset   Aneurysm Sister      ROS:  Please see the history of present illness.   All other ROS reviewed and negative.     Physical Exam/Data:   Vitals:   06/25/20 0345 06/25/20 0400 06/25/20 0415 06/25/20 0715   BP: (!) 151/78 (!) 153/94 139/78 132/73  Pulse: (!) 38 (!) 42 (!) 40 (!) 44  Resp: 13 (!) 25 15 18   Temp:      TempSrc:      SpO2: 99% 97% 98% 95%  Weight:      Height:       No intake or output data in the 24 hours ending 06/25/20 0833 Last 3 Weights 06/24/2020 06/22/2020 06/22/2020  Weight (lbs) 189 lb 2.5 oz 189 lb 3.2 oz 189 lb 3.2 oz  Weight (kg) 85.8 kg 85.821 kg 85.821 kg     Body mass index is 24.96 kg/m.  General:  Elderly gentleman laying in bed with mittens on HEENT: sclera anicteric Neck: +JVD Vascular: No carotid bruits; distal pulses 2+ bilaterally  Cardiac:  normal S1, S2; IRIR; no murmurs, rubs, or gallops appreciated Lungs:  clear to auscultation bilaterally anteriorly without wheezing, rhonchi or rales. Patient not cooperative with posterior exam Abd: soft, nontender, no hepatomegaly  Ext: trace -1+ LE edema Musculoskeletal:  No deformities, BUE and BLE strength normal and equal Skin: warm and dry  Neuro:  Moving all extremities Psych:  Demented  EKG:  The EKG was personally reviewed and demonstrates:  Initial EKG with atrial fibrillation, rate 78 bpm, chronic RBBB, non-specific T wave abnormalities (new compared to 06/19/20) with repeat EKG with rate 41 bpm and more pronounced T wave abnormalities.  Telemetry:  Telemetry was personally reviewed and demonstrates:  Atrial fibrillation with intermittent SVR to 40s and occasional PVCs.   Relevant CV Studies: Echocardiogram 05/22/2020: 1. There is severe concentric LVH up to 2.4 cm. LV function is preserved.  There is increased thickness of the RV as well and a small pericardial  effusion. The patient has normal BP and no history of uncontrolled  hypertension. Strain imaing not  performed. Given the patient's age, and severe concentric LVH, would  consider work-up for cardiac amyloidosis vs other infiltrative  cardiomyopathy. Left ventricular ejection fraction, by estimation, is 65  to 70%. The left ventricle has  normal function.  The left ventricle has no regional wall motion abnormalities. There is  severe concentric left ventricular hypertrophy. Left ventricular diastolic  function could not be evaluated.   2. Right ventricular systolic  function is mildly reduced. The right  ventricular size is mildly enlarged. Moderately increased right  ventricular wall thickness. There is normal pulmonary artery systolic  pressure. The estimated right ventricular systolic  pressure is 31.5 mmHg.   3. Left atrial size was mildly dilated.   4. Right atrial size was mildly dilated.   5. A small pericardial effusion is present. The pericardial effusion is  posterior to the left ventricle and the left atrium.   6. The mitral valve is grossly normal. Mild mitral valve regurgitation.  No evidence of mitral stenosis.   7. The aortic valve is tricuspid. Aortic valve regurgitation is not  visualized. No aortic stenosis is present.   8. The inferior vena cava is normal in size with greater than 50%  respiratory variability, suggesting right atrial pressure of 3 mmHg.   Laboratory Data:  High Sensitivity Troponin:   Recent Labs  Lab 06/24/20 0039 06/24/20 1611  TROPONINIHS 119* 133*     Chemistry Recent Labs  Lab 06/24/20 1611 06/25/20 0527  NA 143 143  K 4.4 4.0  CL 101 103  CO2 28 29  GLUCOSE 117* 101*  BUN 40* 36*  CREATININE 1.54* 1.49*  CALCIUM 9.5 8.9  GFRNONAA 44* 46*  ANIONGAP 14 11    Recent Labs  Lab 06/24/20 1611  PROT 7.7  ALBUMIN 4.0  AST 42*  ALT 35  ALKPHOS 77  BILITOT 2.0*   Hematology Recent Labs  Lab 06/24/20 1611  WBC 5.2  RBC 4.52  HGB 14.0  HCT 43.3  MCV 95.8  MCH 31.0  MCHC 32.3  RDW 16.2*  PLT 203   BNP Recent Labs  Lab 06/24/20 1612  BNP 710.4*    DDimer No results for input(s): DDIMER in the last 168 hours.   Radiology/Studies:  DG Chest 2 View  Result Date: 06/24/2020 CLINICAL DATA:  CHF.  Shortness of breath.  Diabetes.  Dementia. EXAM:  CHEST - 2 VIEW COMPARISON:  05/27/2020. FINDINGS: Both views are motion degraded. Patient rotated left on the frontal. Numerous leads and wires project over the chest. Midline trachea. Mild cardiomegaly. Layering right-sided pleural effusion may be slightly increased. No pneumothorax. Interstitial prominence and indistinctness is not significantly changed. Airspace disease throughout the lower lungs is greater right than left, slightly progressive at the right base. IMPRESSION: Congestive heart failure with slight increase in layering right pleural effusion. Worsened right base aeration, atelectasis or concurrent infection. Relatively diffuse pulmonary opacities elsewhere are likely indicative of alveolar similar alveolar edema. Electronically Signed   By: Jeronimo Greaves M.D.   On: 06/24/2020 17:04     Assessment and Plan:   1. Acute on chronic diastolic CHF: patient has been having increased SOB and LE edema for the past week with one episode of blood tinged sputum 06/22/20. His last echo 05/22/20 showed EF 65-70% and severe concentric LVH with findings c/w cardiac amyloidosis vs other infiltrative cardiomyopathy. No further imaging was recommended at that time, likely due to patients, age, comorbidities, and dementia. If amyloid, possible his lower heart rate over the past month has negatively impacted his cardiac output. CXR on admission showed CHF with increase in right pleural effusion. BNP in the 700s. He was started on IV lasix 80mg  BID  - Continue IV lasix 80mg  BID for now - Continue to monitor strict I&Os and daily weights - Continue to monitor electrolytes closely and replete as needed to maintain K >4, Mg >2   2. Elevated troponin: patient presented with SOB  and LE edema x1 week. No complaints of chest pain. EKG with non-specific T wave abnormalities in inferior, anterior, and lateral leads, new compared to 06/19/20. HsTrop 119>133 - trend not c/w ACS and likely demand ischemia in the setting of  #1.  - Do not anticipate further cardiac work-up this admission  3. Permanent atrial fibrillation with slow ventricular rate: metoprolol was decreased 06/19/20, though he continued to have low HR's and occasional dizziness. Metoprolol discontinued after virtual visit 06/22/20, however he still has HR's in the 40s-60s this admission.  - Continue eliquis for stroke ppx - Continue to monitor on telemetry   4. HTN: BP is up a bit. Favor permissive HTN with ongoing efforts to diurese  - Continue to monitor closely   5. HLD: no recent lipids on file - Continue atorvastatin   6. CKD stage 3: Cr 1.49 this morning, up a wee bit from baseline of 1.1-1.3 last year.  - Continue to monitor closely     Risk Assessment/Risk Scores:    New York Heart Association (NYHA) Functional Class NYHA Class III  CHA2DS2-VASc Score = 4  This indicates a 4.8% annual risk of stroke. The patient's score is based upon: CHF History: Yes HTN History: Yes Diabetes History: No Stroke History: No Vascular Disease History: No Age Score: 2 Gender Score: 0     For questions or updates, please contact [facility] Please consult [email or web address] for contact info under    Signed, Beatriz Stallion, PA-C  06/25/2020 8:33 AM  I have seen and examined the patient along with Beatriz Stallion, PA-C .  I have reviewed the chart, notes and new data.  I agree with PA/NP's note.  Key new complaints: confused, but appears calm. Reports breathing is "hard". Key examination changes: slow irregular rhythm, reduced breath sounds R base, no rales, JVP almost to angle of jaw, 1 + ankle and pretibial edema Key new findings / data: CXR with large R pleural effusion.  AFib w slow ventricular response and relatively frequent idioventricular escape beats.  PLAN: Needs diuresis. Hold all AV node blocking agents. CHF may be bradycardia mediated, but he is not a great pacemaker candidate. He is pulling on all his leads and tubes  and I think he would be a very high risk for lead dislodgement. May consider a leadless pacemaker if truly necessary. Bu it is probably time to discuss a palliative care approach inview of the severity of his dementia.  Thurmon Fair, MD, [facility] [phone] 06/25/2020, 1:04 PM

## 2020-06-26 ENCOUNTER — Telehealth: Payer: Self-pay | Admitting: Cardiovascular Disease

## 2020-06-26 DIAGNOSIS — G301 Alzheimer's disease with late onset: Secondary | ICD-10-CM | POA: Diagnosis not present

## 2020-06-26 DIAGNOSIS — I5033 Acute on chronic diastolic (congestive) heart failure: Secondary | ICD-10-CM | POA: Diagnosis not present

## 2020-06-26 DIAGNOSIS — R001 Bradycardia, unspecified: Secondary | ICD-10-CM

## 2020-06-26 DIAGNOSIS — N1831 Chronic kidney disease, stage 3a: Secondary | ICD-10-CM | POA: Diagnosis not present

## 2020-06-26 DIAGNOSIS — E876 Hypokalemia: Secondary | ICD-10-CM

## 2020-06-26 LAB — RENAL FUNCTION PANEL
Albumin: 3.8 g/dL (ref 3.5–5.0)
Anion gap: 13 (ref 5–15)
BUN: 40 mg/dL — ABNORMAL HIGH (ref 8–23)
CO2: 31 mmol/L (ref 22–32)
Calcium: 9.1 mg/dL (ref 8.9–10.3)
Chloride: 101 mmol/L (ref 98–111)
Creatinine, Ser: 1.5 mg/dL — ABNORMAL HIGH (ref 0.61–1.24)
GFR, Estimated: 46 mL/min — ABNORMAL LOW (ref >60)
Glucose, Bld: 115 mg/dL — ABNORMAL HIGH (ref 70–99)
Phosphorus: 4.1 mg/dL (ref 2.5–4.6)
Potassium: 3 mmol/L — ABNORMAL LOW (ref 3.5–5.1)
Sodium: 145 mmol/L (ref 135–145)

## 2020-06-26 LAB — MAGNESIUM: Magnesium: 2.3 mg/dL (ref 1.7–2.4)

## 2020-06-26 MED ORDER — LORAZEPAM 2 MG/ML IJ SOLN: 1.0000 mg | Freq: Once | INTRAMUSCULAR | Status: DC

## 2020-06-26 MED ORDER — LORAZEPAM 2 MG/ML IJ SOLN
0.5000 mg | Freq: Four times a day (QID) | INTRAMUSCULAR | Status: DC | PRN
  Administered 2020-06-26: 1 mg via INTRAVENOUS
  Administered 2020-06-27: 0.5 mg via INTRAVENOUS
  Administered 2020-06-27 (×2): 1 mg via INTRAVENOUS
  Filled 2020-06-26 (×4): qty 1

## 2020-06-26 MED ORDER — POTASSIUM CHLORIDE CRYS ER 20 MEQ PO TBCR
40.0000 meq | EXTENDED_RELEASE_TABLET | ORAL | Status: AC
  Administered 2020-06-26 (×2): 40 meq via ORAL
  Filled 2020-06-26 (×2): qty 2

## 2020-06-26 MED ORDER — FUROSEMIDE 40 MG PO TABS
40.0000 mg | ORAL_TABLET | Freq: Two times a day (BID) | ORAL | Status: DC
  Administered 2020-06-26 – 2020-06-27 (×3): 40 mg via ORAL
  Filled 2020-06-26 (×3): qty 1

## 2020-06-26 NOTE — Progress Notes (Signed)
Updated daughter-in-law, Annice Pih on pt. Private sitter, Gloriajean Dell, currently in room with pt. Will continue to monitor patient.

## 2020-06-26 NOTE — Progress Notes (Signed)
Resumed care for this pt at 0100. I agree with previous RN's assessment. Pt currently resting in bed.

## 2020-06-26 NOTE — Progress Notes (Signed)
PROGRESS NOTE  James Greer NFA:[id number] DOB: Dec 21, 1933   PCP: Kermit Balo, DO  Patient is from: ALF  DOA: 06/24/2020 LOS: 1  Chief complaints: Shortness of breath and leg swelling  Brief Narrative / Interim history: 87 year old male with PMH of dementia, permanent A. fib, diastolic CHF, CKD-3A and HTN brought to ED from ALF with dyspnea and leg swelling and admitted for acute on chronic diastolic CHF and bradycardia.  Started on IV Lasix.  Cardiology consulted given bradycardia with ventricular response, and mildly elevated troponin.  Subjective: Seen and examined earlier this morning.  Probably sitter at bedside.  He was confused and asking to go home earlier but fell asleep.  Mittens off.  No apparent distress.  On 2 L by [place].  Objective: Vitals:   06/25/20 1338 06/25/20 2050 06/26/20 0500 06/26/20 0657  BP: 138/82 (!) 146/63  (!) 152/139  Pulse: (!) 41 (!) 47  (!) 45  Resp: 19     Temp: 97.6 F (36.4 C) 99.4 F (37.4 C)  97.7 F (36.5 C)  TempSrc: Oral     SpO2: 94% 95%  94%  Weight:   79.7 kg   Height:        Intake/Output Summary (Last 24 hours) at 06/26/2020 1444 Last data filed at 06/26/2020 1311 Gross per 24 hour  Intake 240 ml  Output 3050 ml  Net -2810 ml   Filed Weights   06/24/20 1542 06/25/20 1105 06/26/20 0500  Weight: 85.8 kg 82.4 kg 79.7 kg    Examination:  GENERAL: No apparent distress.  Nontoxic. HEENT: MMM.  Vision and hearing grossly intact.  NECK: Supple.  No apparent JVD.  RESP: On 2 L by [place].  No IWOB.  Rhonchi bilaterally. CVS: Bradycardia to 50s.  Distant heart sounds ABD/GI/GU: BS+. Abd soft, NTND.  MSK/EXT:  No apparent deformity. No edema.  SKIN: no apparent skin lesion or wound NEURO: Sleepy.  No apparent focal neuro deficit. PSYCH: Calm sleepy.  Procedures:  None  Microbiology summarized: COVID-19 PCR nonreactive.  Assessment & Plan: Acute on chronic diastolic CHF: Last TTE in 05/2020 with LVEF of 65 to 70%, severe  concentric LVH concerning for infiltrative CM. He is on Lasix 40 mg daily at home. CXR consistent with CHF.  BNP elevated to 700.  Heart 2.1 L UOP/24 hours.  BMP and creatinine stable.  Breathing and fluid status improved but he still on 2 L by . -Cardiology managing-transition to p.o. Lasix 40 mg twice daily. -Monitor fluid status, renal function and electrolytes -Daughter requesting CXR and BNP prior to discharge which is reasonable.  Acute hypoxemic respiratory failure?  Desaturated to 83% once on admission.  Currently on 2 L. -Wean oxygen as tolerated -Diuretics as above  Bradycardia/permanent A. fib with slow ventricular rate-was on metoprolol which was discontinued on 1/21.  Not on Aricept.  He also has RBBB.  Per cardiology, not a great candidate for PPM due to very high risk for lead dislodgment.  Recommended palliative approach -Continue home Eliquis and telemetry monitoring -Palliative medicine consulted.  Elevated troponin: 119>>>133.  Likely demand ischemia in the setting of CHF. -Manage CHF as above  Essential hypertension: Early morning BP 152/139-doubt accuracy -Repeat BP  CKD-3A/azotemia: Cr slightly higher than recent baseline but stable. Recent Labs    01/23/20 0000 02/28/20 0000 06/24/20 1611 06/25/20 0527 06/26/20 0956  BUN 24*  --  40* 36* 40*  CREATININE 1.1 1.3 1.54* 1.49* 1.50*  -Continue diuretics as above -Continue monitoring  Hyperlipidemia -  Continue home statin.  Dementia with behavioral disturbance -Reorientation and delirium precautions. -Continue home quetiapine and trazodone -IV Haldol or Ativan as needed -Appreciate help by personal sitter  DNR/DNI-appropriate -Palliative medicine consulted  Body mass index is 23.18 kg/m.         DVT prophylaxis:  apixaban (ELIQUIS) tablet 5 mg    Code Status: DNR/DNI Family Communication: Updated patient's daughter-in-law, Annice Pih over the phone. Level of care: Telemetry Status is:  Inpatient  Remains inpatient appropriate because:Persistent severe electrolyte disturbances, Altered mental status, Unsafe d/c plan and Inpatient level of care appropriate due to severity of illness   Dispo: The patient is from: ALF              Anticipated d/c is to: ALF              Anticipated d/c date is: 1 day              Patient currently is not medically stable to d/c.   Difficult to place patient No       Consultants:  Cardiology   Sch Meds:  Scheduled Meds: . apixaban  5 mg Oral BID  . atorvastatin  40 mg Oral Daily  . furosemide  40 mg Oral BID  . potassium chloride  40 mEq Oral Q3H  . QUEtiapine  75 mg Oral QHS  . sodium chloride flush  3 mL Intravenous Q12H  . traZODone  50 mg Oral QHS   Continuous Infusions: . sodium chloride     PRN Meds:.sodium chloride, acetaminophen, benztropine mesylate, haloperidol lactate, LORazepam, ondansetron (ZOFRAN) IV, polyvinyl alcohol, sodium chloride flush  Antimicrobials: Anti-infectives (From admission, onward)   None       I have personally reviewed the following labs and images: CBC: Recent Labs  Lab 06/24/20 1611  WBC 5.2  NEUTROABS 3.1  HGB 14.0  HCT 43.3  MCV 95.8  PLT 203   BMP &GFR Recent Labs  Lab 06/24/20 1611 06/25/20 0527 06/26/20 0956  NA 143 143 145  K 4.4 4.0 3.0*  CL 101 103 101  CO2 28 29 31   GLUCOSE 117* 101* 115*  BUN 40* 36* 40*  CREATININE 1.54* 1.49* 1.50*  CALCIUM 9.5 8.9 9.1  MG  --   --  2.3  PHOS  --   --  4.1   Estimated Creatinine Clearance: 41.3 mL/min (A) (by C-G formula based on SCr of 1.5 mg/dL (H)). Liver & Pancreas: Recent Labs  Lab 06/24/20 1611 06/26/20 0956  AST 42*  --   ALT 35  --   ALKPHOS 77  --   BILITOT 2.0*  --   PROT 7.7  --   ALBUMIN 4.0 3.8   No results for input(s): LIPASE, AMYLASE in the last 168 hours. No results for input(s): AMMONIA in the last 168 hours. Diabetic: No results for input(s): HGBA1C in the last 72 hours. No results  for input(s): GLUCAP in the last 168 hours. Cardiac Enzymes: No results for input(s): CKTOTAL, CKMB, CKMBINDEX, TROPONINI in the last 168 hours. No results for input(s): PROBNP in the last 8760 hours. Coagulation Profile: No results for input(s): INR, PROTIME in the last 168 hours. Thyroid Function Tests: No results for input(s): TSH, T4TOTAL, FREET4, T3FREE, THYROIDAB in the last 72 hours. Lipid Profile: No results for input(s): CHOL, HDL, LDLCALC, TRIG, CHOLHDL, LDLDIRECT in the last 72 hours. Anemia Panel: No results for input(s): VITAMINB12, FOLATE, FERRITIN, TIBC, IRON, RETICCTPCT in the last 72 hours. Urine analysis: No  results found for: COLORURINE, APPEARANCEUR, LABSPEC, PHURINE, GLUCOSEU, HGBUR, BILIRUBINUR, KETONESUR, PROTEINUR, UROBILINOGEN, NITRITE, LEUKOCYTESUR Sepsis Labs: Invalid input(s): PROCALCITONIN, LACTICIDVEN  Microbiology: Recent Results (from the past 240 hour(s))  SARS Coronavirus 2 by RT PCR (hospital order, performed in [facility] hospital lab) Nasopharyngeal Nasopharyngeal Swab     Status: None   Collection Time: 06/24/20  4:12 PM   Specimen: Nasopharyngeal Swab  Result Value Ref Range Status   SARS Coronavirus 2 NEGATIVE NEGATIVE Final    Comment: (NOTE) SARS-CoV-2 target nucleic acids are NOT DETECTED.  The SARS-CoV-2 RNA is generally detectable in upper and lower respiratory specimens during the acute phase of infection. The lowest concentration of SARS-CoV-2 viral copies this assay can detect is 250 copies / mL. A negative result does not preclude SARS-CoV-2 infection and should not be used as the sole basis for treatment or other patient management decisions.  A negative result may occur with improper specimen collection / handling, submission of specimen other than nasopharyngeal swab, presence of viral mutation(s) within the areas targeted by this assay, and inadequate number of viral copies (<250 copies / mL). A negative result must be combined  with clinical observations, patient history, and epidemiological information.  Fact Sheet for Patients:   BoilerBrush.com.cy  Fact Sheet for Healthcare Providers: https://pope.com/  This test is not yet approved or  cleared by the [place] FDA and has been authorized for detection and/or diagnosis of SARS-CoV-2 by FDA under an Emergency Use Authorization (EUA).  This EUA will remain in effect (meaning this test can be used) for the duration of the COVID-19 declaration under Section 564(b)(1) of the Act, 21 U.S.C. section 360bbb-3(b)(1), unless the authorization is terminated or revoked sooner.  Performed at [facility], [address], [place], [place] [address]   MRSA PCR Screening     Status: None   Collection Time: 06/25/20 12:37 PM   Specimen: Nasopharyngeal  Result Value Ref Range Status   MRSA by PCR NEGATIVE NEGATIVE Final    Comment:        The GeneXpert MRSA Assay (FDA approved for NASAL specimens only), is one component of a comprehensive MRSA colonization surveillance program. It is not intended to diagnose MRSA infection nor to guide or monitor treatment for MRSA infections. Performed at [facility], [address] [place], [place] [address]     Radiology Studies: No results found.  Dreden Rivere T. Sofia Vanmeter [facility]  If 7PM-7AM, please contact night-coverage [email or web address] 06/26/2020, 2:44 PM

## 2020-06-26 NOTE — Telephone Encounter (Signed)
Patient's daughter in law states the patient is currently in the hospital and she has not been able to get information. She would like to know if Dr. Tresa Endo knows anything. She also states the patient has an appointment 06/29/2020 and is not sure if he will be out of the hospital by then.

## 2020-06-26 NOTE — Telephone Encounter (Signed)
Spoke with patient's daughter-in-law. Explained that she would need to contact the nurse on his unit for an update on how he is doing as I do not have realtime update from the office. Explained that per chart review, rounds have not been completed today, as no MD note in epic yet. Suggested she call to hospital around 10am or so to check on him   She asked about a pacemaker, stating that hospitalist mentioned this to her, but deferred any follow up questions/concerns to cardiology. She states she was told me may not be a candidate. Explained that there are various factors that go into whether patient would be well suited for this, explained that checks of the pacemaker would be required, nothing external that he could remove or pull off (she asked about this since he is in a memory care unit).   No further assistance needed at this time.

## 2020-06-26 NOTE — Progress Notes (Addendum)
Progress Note  Patient Name: James Greer Date of Encounter: 06/26/2020  Primary Cardiologist: Nicki Guadalajara, MD   Subjective   Aid at bedside states he has been very restless. Not sleeping. Asking if he can go home today.  Inpatient Medications    Scheduled Meds: . apixaban  5 mg Oral BID  . atorvastatin  40 mg Oral Daily  . furosemide  80 mg Intravenous BID  . potassium chloride  40 mEq Oral Q3H  . QUEtiapine  75 mg Oral QHS  . sodium chloride flush  3 mL Intravenous Q12H  . traZODone  50 mg Oral QHS   Continuous Infusions: . sodium chloride     PRN Meds: sodium chloride, acetaminophen, benztropine mesylate, haloperidol lactate, LORazepam, ondansetron (ZOFRAN) IV, polyvinyl alcohol, sodium chloride flush   Vital Signs    Vitals:   06/25/20 1338 06/25/20 2050 06/26/20 0500 06/26/20 0657  BP: 138/82 (!) 146/63  (!) 152/139  Pulse: (!) 41 (!) 47  (!) 45  Resp: 19     Temp: 97.6 F (36.4 C) 99.4 F (37.4 C)  97.7 F (36.5 C)  TempSrc: Oral     SpO2: 94% 95%  94%  Weight:   79.7 kg   Height:        Intake/Output Summary (Last 24 hours) at 06/26/2020 1117 Last data filed at 06/26/2020 9233 Gross per 24 hour  Intake 477 ml  Output 2750 ml  Net -2273 ml   Filed Weights   06/24/20 1542 06/25/20 1105 06/26/20 0500  Weight: 85.8 kg 82.4 kg 79.7 kg    Telemetry    Atrial fibrillation with rates in the 50s-70s with PVCs - no significant bradycardia or pauses - Personally Reviewed  ECG    No new tracings - Personally Reviewed  Physical Exam   GEN: Elderly gentleman with mittens in place   Neck: No significant JVD today, no carotid bruits Cardiac: IRIR, no murmurs, rubs, or gallops.  Respiratory: Clear to auscultation bilaterally, no wheezes/ rales/ rhonchi GI: NABS, Soft, nontender, non-distended  MS: No edema; No deformity. Neuro:  Nonfocal, moving all extremities spontaneously Psych: Normal affect   Labs    Chemistry Recent Labs  Lab  06/24/20 1611 06/25/20 0527 06/26/20 0956  NA 143 143 145  K 4.4 4.0 3.0*  CL 101 103 101  CO2 28 29 31   GLUCOSE 117* 101* 115*  BUN 40* 36* 40*  CREATININE 1.54* 1.49* 1.50*  CALCIUM 9.5 8.9 9.1  PROT 7.7  --   --   ALBUMIN 4.0  --  3.8  AST 42*  --   --   ALT 35  --   --   ALKPHOS 77  --   --   BILITOT 2.0*  --   --   GFRNONAA 44* 46* 46*  ANIONGAP 14 11 13      Hematology Recent Labs  Lab 06/24/20 1611  WBC 5.2  RBC 4.52  HGB 14.0  HCT 43.3  MCV 95.8  MCH 31.0  MCHC 32.3  RDW 16.2*  PLT 203    Cardiac EnzymesNo results for input(s): TROPONINI in the last 168 hours. No results for input(s): TROPIPOC in the last 168 hours.   BNP Recent Labs  Lab 06/24/20 1612  BNP 710.4*     DDimer No results for input(s): DDIMER in the last 168 hours.   Radiology    DG Chest 2 View  Result Date: 06/24/2020 CLINICAL DATA:  CHF.  Shortness of breath.  Diabetes.  Dementia. EXAM: CHEST - 2 VIEW COMPARISON:  05/27/2020. FINDINGS: Both views are motion degraded. Patient rotated left on the frontal. Numerous leads and wires project over the chest. Midline trachea. Mild cardiomegaly. Layering right-sided pleural effusion may be slightly increased. No pneumothorax. Interstitial prominence and indistinctness is not significantly changed. Airspace disease throughout the lower lungs is greater right than left, slightly progressive at the right base. IMPRESSION: Congestive heart failure with slight increase in layering right pleural effusion. Worsened right base aeration, atelectasis or concurrent infection. Relatively diffuse pulmonary opacities elsewhere are likely indicative of alveolar similar alveolar edema. Electronically Signed   By: Jeronimo Greaves M.D.   On: 06/24/2020 17:04    Cardiac Studies   Echocardiogram 05/22/2020: 1. There is severe concentric LVH up to 2.4 cm. LV function is preserved.  There is increased thickness of the RV as well and a small pericardial  effusion. The  patient has normal BP and no history of uncontrolled  hypertension. Strain imaing not  performed. Given the patient's age, and severe concentric LVH, would  consider work-up for cardiac amyloidosis vs other infiltrative  cardiomyopathy. Left ventricular ejection fraction, by estimation, is 65  to 70%. The left ventricle has normal function.  The left ventricle has no regional wall motion abnormalities. There is  severe concentric left ventricular hypertrophy. Left ventricular diastolic  function could not be evaluated.  2. Right ventricular systolic function is mildly reduced. The right  ventricular size is mildly enlarged. Moderately increased right  ventricular wall thickness. There is normal pulmonary artery systolic  pressure. The estimated right ventricular systolic  pressure is 31.5 mmHg.  3. Left atrial size was mildly dilated.  4. Right atrial size was mildly dilated.  5. A small pericardial effusion is present. The pericardial effusion is  posterior to the left ventricle and the left atrium.  6. The mitral valve is grossly normal. Mild mitral valve regurgitation.  No evidence of mitral stenosis.  7. The aortic valve is tricuspid. Aortic valve regurgitation is not  visualized. No aortic stenosis is present.  8. The inferior vena cava is normal in size with greater than 50%  respiratory variability, suggesting right atrial pressure of 3 mmHg.   Patient Profile     87 y.o. male with a PMH of permanent atrial fibrillation, HTN, HLD, severe LVH, CKD stage3, and dementia who is being followed by cardiology for the evaluation of CHF.   Assessment & Plan    1. Acute on chronic diastolic CHF: patient has been having increased SOB and LE edema for the past week with one episode of blood tinged sputum 06/22/20. His last echo 05/22/20 showed EF 65-70% and severe concentric LVH with findings c/w cardiac amyloidosis vs other infiltrative cardiomyopathy. No further imaging was  recommended at that time, likely due to patients, age, comorbidities, and dementia. If amyloid, possible his lower heart rate over the past month has negatively impacted his cardiac output. CXR on admission showed CHF with increase in right pleural effusion. BNP in the 700s. He was started on IV lasix 80mg  BID. UOP net -1.6L in the past 24 hours. Weight down from 189lbs on admission to 175lbs today. - Favor transition to po lasix 40mg  BID  - Continue to monitor strict I&Os and daily weights - Continue to monitor electrolytes closely and replete as needed to maintain K >4, Mg >2  2. Elevated troponin: patient presented with SOB and LE edema x1 week. No complaints of chest pain. EKG with non-specific T wave  abnormalities in inferior, anterior, and lateral leads, new compared to 06/19/20. HsTrop 119>133 - trend not c/w ACS and likely demand ischemia in the setting of #1.  - Do not anticipate further cardiac work-up this admission  3. Permanent atrial fibrillation with slow ventricular rate: metoprolol was decreased 06/19/20, though he continued to have low HR's and occasional dizziness. Metoprolol discontinued after virtual visit 06/22/20, however he still has HR's in the 40s-60s on admission. HR stable overnight without significant bradycardia on telemetry - No indication for PPM at this time - Continue to avoid AV nodal blocking agents - Continue eliquis for stroke ppx - Continue to monitor on telemetry  4. HTN:BP is up a bit. Favor permissive HTN given issues with dizziness in the past - Continue to monitor closely  5. HLD:no recent lipids on file - Continue atorvastatin  6. CKD stage 3:Cr stable at 1.5 this morning - Continue to monitor closely  Spoke with daughter-in-law, Annice Pih over the phone and updated to plan. Patient has close follow-up arranged 06/29/20 with myself.   For questions or updates, please contact [facility] Please consult [email or web address] for contact info under  Cardiology/STEMI.      Signed, Beatriz Stallion, PA-C  06/26/2020, 11:17 AM   [phone]  I have seen and examined the patient along with Beatriz Stallion, PA-C.  I have reviewed the chart, notes and new data.  I agree with PA/NP's note.  Key new complaints: disoriented, wants to go home. Key examination changes: irregular rhythm, JVD improved, Key new findings / data: Afib,  less bradycardia, 50s while relaxed, 70s when more agitated No change in BUN/creat w diuresis.  PLAN: It seems that we can avoid pacemaker implantation. No beta blockers/digoxin/verapamil or diltiazem. Change to Po diuretics. Discuss a palliative care approach.  Thurmon Fair, MD, [facility] [phone] 06/26/2020, 1:54 PM

## 2020-06-27 ENCOUNTER — Inpatient Hospital Stay (HOSPITAL_COMMUNITY): Payer: Medicare HMO

## 2020-06-27 DIAGNOSIS — I5033 Acute on chronic diastolic (congestive) heart failure: Secondary | ICD-10-CM | POA: Diagnosis not present

## 2020-06-27 LAB — RENAL FUNCTION PANEL
Albumin: 3.3 g/dL — ABNORMAL LOW (ref 3.5–5.0)
Anion gap: 12 (ref 5–15)
BUN: 37 mg/dL — ABNORMAL HIGH (ref 8–23)
CO2: 30 mmol/L (ref 22–32)
Calcium: 9 mg/dL (ref 8.9–10.3)
Chloride: 105 mmol/L (ref 98–111)
Creatinine, Ser: 1.29 mg/dL — ABNORMAL HIGH (ref 0.61–1.24)
GFR, Estimated: 55 mL/min — ABNORMAL LOW (ref >60)
Glucose, Bld: 105 mg/dL — ABNORMAL HIGH (ref 70–99)
Phosphorus: 4 mg/dL (ref 2.5–4.6)
Potassium: 3.6 mmol/L (ref 3.5–5.1)
Sodium: 147 mmol/L — ABNORMAL HIGH (ref 135–145)

## 2020-06-27 LAB — SARS CORONAVIRUS 2 (TAT 6-24 HRS): SARS Coronavirus 2: NEGATIVE

## 2020-06-27 LAB — MAGNESIUM: Magnesium: 2.2 mg/dL (ref 1.7–2.4)

## 2020-06-27 LAB — BRAIN NATRIURETIC PEPTIDE: B Natriuretic Peptide: 407 pg/mL — ABNORMAL HIGH (ref 0.0–100.0)

## 2020-06-27 MED ORDER — FUROSEMIDE 40 MG PO TABS: 40.0000 mg | ORAL_TABLET | Freq: Two times a day (BID) | ORAL | 0 refills | Status: DC

## 2020-06-27 MED ORDER — TRAZODONE HCL 50 MG PO TABS: 50.0000 mg | ORAL_TABLET | Freq: Every day | ORAL | 0 refills | Status: DC

## 2020-06-27 MED ORDER — QUETIAPINE FUMARATE 50 MG PO TABS: 75.0000 mg | ORAL_TABLET | Freq: Every day | ORAL | 0 refills | Status: DC

## 2020-06-27 NOTE — TOC Transition Note (Signed)
Transition of Care [facility]) - CM/SW Discharge Note   Patient Details  Name: James Greer MRN: [id number] Date of Birth: 1934/01/25  [facility] CM/SW Contact:  Lanier Clam, RN Phone Number: 06/27/2020, 10:27 AM   Clinical Narrative:  D/c summary faxed to [facility] with confirmation from donna of receipt-patient going to [facility] rm#[id number], nsg call report tel#[phone]. [facility] called as Will Call-nsg to call [facility] once coivd results are back. No further CM needs.    Final next level of care: Memory Care Barriers to Discharge: No Barriers Identified   Patient Goals and CMS Choice Patient states their goals for this hospitalization and ongoing recovery are:: Return back to [facility]-memory are unit CMS Medicare.gov Compare Post Acute Care list provided to:: Patient Represenative (must comment)    Discharge Placement              Patient chooses bed at: Other - please specify in the comment section below: ([facility]) Patient to be transferred to facility by: [facility] Name of family member notified: Annice Pih dtr in law [phone] Patient and family notified of of transfer: 06/27/20  Discharge Plan and Services   Discharge Planning Services: CM Consult                                 Social Determinants of Health (SDOH) Interventions     Readmission Risk Interventions No flowsheet data found.

## 2020-06-27 NOTE — Progress Notes (Signed)
PMT consult received and chart reviewed. Discussed with Dr. Mahala Menghini. No palliative needs. Patient is discharging today. Recommend outpatient palliative f/u at [facility]. Thank you.  NO CHARGE  Vennie Homans, DNP, FNP-C Palliative Medicine Team  Phone: [phone] Fax: [phone]

## 2020-06-27 NOTE — Progress Notes (Signed)
[facility] called to transport pt back to [facility] care. Report called to Inetta Fermo at facility. Private sitter Nadine in room with patient and will stay until transport arrives.

## 2020-06-27 NOTE — Discharge Summary (Signed)
Physician Discharge Summary  James Greer ZOX:[id number] DOB: 1934-01-09 DOA: 06/24/2020  PCP: Kermit Balo, DO  Admit date: 06/24/2020 Discharge date: 06/27/2020  Time spent: 36 minutes  Recommendations for Outpatient Follow-up:  1. Recommend continuation of Lasix at 40 mg twice daily dosing in the outpatient setting and Chem-7 in 1 week subsequently to guide therapy 2. Recommend outpatient weights to be done daily and if gains more than 2 kg and 24-hour period of time-consider giving an extra dose of Lasix 3. Consideration for Namenda or Aricept as an outpatient if not already tolerated 4. Needs outpatient continue goals of care discussion 5. Rx given for controlled substances on discharge 6. Require oxygen 2 L at all times upon discharge  Discharge Diagnoses:  Principal Problem:   Acute on chronic diastolic CHF (congestive heart failure) (HCC) Active Problems:   Dementia (HCC)   Type 2 diabetes mellitus with diabetic chronic kidney disease (HCC)   CKD (chronic kidney disease), stage III (HCC)   Permanent atrial fibrillation (HCC)   Bradycardia   Discharge Condition: Fair  Diet recommendation: Heart healthy although may liberalize  Filed Weights   06/25/20 1105 06/26/20 0500 06/27/20 0518  Weight: 82.4 kg 79.7 kg 77 kg    History of present illness:  87 year old white male with permanent A. fib CHADS2 score >4 on anticoagulation, HFpEF CKD 3 dementia Admitted with acute superimposed on chronic diastolic heart failure and bradycardia Cardiology consulted for assistance with management  Hospital Course:  Acute on chronic diastolic CHF: Last TTE in 05/2020 with LVEF of 65 to 70%, severe concentric LVH concerning for infiltrative CM. He is on Lasix 40 mg daily at home. CXR consistent with CHF.  BNP elevated to 700.   Patient has gone from 89kg to 77 kg since hospitalization BMP and creatinine stable.  Breathing and fluid status improved but he still on 2 L by  [facility]. -Cardiology recommending on discharge transition to p.o. Lasix 40 mg twice daily. -Monitor fluid status, renal function and electrolytes  -BNP pending, CXR shows right-sided atelectasis  Acute hypoxemic respiratory failure?  Desaturated to 83% once on admission.  Currently on 2 L. -Diuretics as above -We will probably need diuretics ongoing and oxygen ongoing  Bradycardia/permanent A. fib with slow ventricular rate-was poor candidacy for PPM due to high risk of lead dislodgment -Continue home Eliquis on discharge -Palliative medicine consulted.  Elevated troponin: 119>>>133.  Likely demand ischemia in the setting of CHF. -Manage CHF as above  Essential hypertension: Early morning BP 152/139-doubt accuracy -Repeat BP  CKD-3A/azotemia: Cr slightly higher than recent baseline but stable. -Continue diuretics as above -Continue monitoring  Hyperlipidemia -Continue home statin.  Dementia with behavioral disturbance -Reorientation and delirium precautions. -Continue home quetiapine and trazodone -Appreciate help by personal sitter   Procedures: Echocardiogram this admission Consultations:  Cardiology  Discharge Exam: Vitals:   06/27/20 0652 06/27/20 0700  BP:    Pulse:    Resp:    Temp:    SpO2: (!) 82% 98%    General: Awake alert to person but not coherent not making much sense Cardiovascular: S1-S2 no murmur no rub no gallop leads show artifact Respiratory: Clear no added sound no rales no rhonchi Quite fidgety Wearing mittens Neurologically cannot assess  Discharge Instructions   Discharge Instructions    Diet - low sodium heart healthy   Complete by: As directed    Increase activity slowly   Complete by: As directed      Allergies as of 06/27/2020  No Known Allergies     Medication List    STOP taking these medications   potassium chloride 10 MEQ CR capsule Commonly known as: MICRO-K   potassium chloride SA 20 MEQ tablet Commonly  known as: KLOR-CON     TAKE these medications   acetaminophen 500 MG tablet Commonly known as: TYLENOL Take 650 mg by mouth in the morning, at noon, in the evening, and at bedtime.   apixaban 5 MG Tabs tablet Commonly known as: ELIQUIS Take 1 tablet (5 mg total) by mouth 2 (two) times daily.   atorvastatin 40 MG tablet Commonly known as: LIPITOR Take 40 mg by mouth daily.   furosemide 40 MG tablet Commonly known as: LASIX Take 1 tablet (40 mg total) by mouth 2 (two) times daily. What changed:   medication strength  how much to take  Another medication with the same name was removed. Continue taking this medication, and follow the directions you see here.   Glucerna Liqd Take 237 mLs by mouth 2 (two) times daily between meals.   hydroxypropyl methylcellulose / hypromellose 2.5 % ophthalmic solution Commonly known as: ISOPTO TEARS / GONIOVISC Place 1 drop into both eyes 3 (three) times daily as needed for dry eyes.   MULTI-DAY PLUS MINERALS PO Take 18 mg by mouth. Iron-400 mcg-25 mcg; amt: 1 tablet; oral. Once a morning   OVER THE COUNTER MEDICATION Take by mouth See admin instructions. 2 fiber thins with 8 ounces of water,juice or hot tea every morning.   QUEtiapine 50 MG tablet Commonly known as: SEROQUEL Take 1.5 tablets (75 mg total) by mouth at bedtime. Taking 1 & 1/2 tablets= 75 mg   sodium fluoride 1.1 % Crea dental cream Commonly known as: PREVIDENT 5000 PLUS Place 1 application onto teeth every evening.   traZODone 50 MG tablet Commonly known as: DESYREL Take 1 tablet (50 mg total) by mouth at bedtime. For sleep      No Known Allergies    The results of significant diagnostics from this hospitalization (including imaging, microbiology, ancillary and laboratory) are listed below for reference.    Significant Diagnostic Studies: DG Chest 2 View  Result Date: 06/24/2020 CLINICAL DATA:  CHF.  Shortness of breath.  Diabetes.  Dementia. EXAM: CHEST - 2  VIEW COMPARISON:  05/27/2020. FINDINGS: Both views are motion degraded. Patient rotated left on the frontal. Numerous leads and wires project over the chest. Midline trachea. Mild cardiomegaly. Layering right-sided pleural effusion may be slightly increased. No pneumothorax. Interstitial prominence and indistinctness is not significantly changed. Airspace disease throughout the lower lungs is greater right than left, slightly progressive at the right base. IMPRESSION: Congestive heart failure with slight increase in layering right pleural effusion. Worsened right base aeration, atelectasis or concurrent infection. Relatively diffuse pulmonary opacities elsewhere are likely indicative of alveolar similar alveolar edema. Electronically Signed   By: Jeronimo Greaves M.D.   On: 06/24/2020 17:04   DG Chest Port 1 View  Result Date: 06/27/2020 CLINICAL DATA:  CHF. EXAM: PORTABLE CHEST 1 VIEW COMPARISON:  06/24/2020. FINDINGS: Exam limited as the patient was combative. Cardiomegaly again noted. Progressive right lower lobe atelectasis and consolidation. Diffuse bilateral pulmonary infiltrates/edema again noted. Bilateral pleural effusions again noted. No pneumothorax. IMPRESSION: 1. Progressive right lower lobe atelectasis and consolidation. Diffuse bilateral pulmonary infiltrates/edema again noted. Bilateral pleural effusions again noted. 2. Cardiomegaly again noted. Electronically Signed   By: Maisie Fus  Register   On: 06/27/2020 05:57   XR Toe Great Left  Result  Date: 06/14/2020 X-rays of the left great toe reveal anatomic alignment of the fracture, not a lot of callus formation yet.   Microbiology: Recent Results (from the past 240 hour(s))  SARS Coronavirus 2 by RT PCR (hospital order, performed in [facility] hospital lab) Nasopharyngeal Nasopharyngeal Swab     Status: None   Collection Time: 06/24/20  4:12 PM   Specimen: Nasopharyngeal Swab  Result Value Ref Range Status   SARS Coronavirus 2 NEGATIVE  NEGATIVE Final    Comment: (NOTE) SARS-CoV-2 target nucleic acids are NOT DETECTED.  The SARS-CoV-2 RNA is generally detectable in upper and lower respiratory specimens during the acute phase of infection. The lowest concentration of SARS-CoV-2 viral copies this assay can detect is 250 copies / mL. A negative result does not preclude SARS-CoV-2 infection and should not be used as the sole basis for treatment or other patient management decisions.  A negative result may occur with improper specimen collection / handling, submission of specimen other than nasopharyngeal swab, presence of viral mutation(s) within the areas targeted by this assay, and inadequate number of viral copies (<250 copies / mL). A negative result must be combined with clinical observations, patient history, and epidemiological information.  Fact Sheet for Patients:   BoilerBrush.com.cy  Fact Sheet for Healthcare Providers: https://pope.com/  This test is not yet approved or  cleared by the [place] FDA and has been authorized for detection and/or diagnosis of SARS-CoV-2 by FDA under an Emergency Use Authorization (EUA).  This EUA will remain in effect (meaning this test can be used) for the duration of the COVID-19 declaration under Section 564(b)(1) of the Act, 21 U.S.C. section 360bbb-3(b)(1), unless the authorization is terminated or revoked sooner.  Performed at [facility], [address] [place], [place] [address]   MRSA PCR Screening     Status: None   Collection Time: 06/25/20 12:37 PM   Specimen: Nasopharyngeal  Result Value Ref Range Status   MRSA by PCR NEGATIVE NEGATIVE Final    Comment:        The GeneXpert MRSA Assay (FDA approved for NASAL specimens only), is one component of a comprehensive MRSA colonization surveillance program. It is not intended to diagnose MRSA infection nor to guide or monitor treatment  for MRSA infections. Performed at [facility], [address] [place], [place] [address]      Labs: Basic Metabolic Panel: Recent Labs  Lab 06/24/20 1611 06/25/20 0527 06/26/20 0956 06/27/20 0550  NA 143 143 145 147*  K 4.4 4.0 3.0* 3.6  CL 101 103 101 105  CO2 28 29 31 30   GLUCOSE 117* 101* 115* 105*  BUN 40* 36* 40* 37*  CREATININE 1.54* 1.49* 1.50* 1.29*  CALCIUM 9.5 8.9 9.1 9.0  MG  --   --  2.3 2.2  PHOS  --   --  4.1 4.0   Liver Function Tests: Recent Labs  Lab 06/24/20 1611 06/26/20 0956 06/27/20 0550  AST 42*  --   --   ALT 35  --   --   ALKPHOS 77  --   --   BILITOT 2.0*  --   --   PROT 7.7  --   --   ALBUMIN 4.0 3.8 3.3*   No results for input(s): LIPASE, AMYLASE in the last 168 hours. No results for input(s): AMMONIA in the last 168 hours. CBC: Recent Labs  Lab 06/24/20 1611  WBC 5.2  NEUTROABS 3.1  HGB 14.0  HCT 43.3  MCV 95.8  PLT 203   Cardiac Enzymes: No results for input(s): CKTOTAL, CKMB, CKMBINDEX, TROPONINI in the last 168 hours. BNP: BNP (last 3 results) Recent Labs    06/24/20 1612  BNP 710.4*    ProBNP (last 3 results) No results for input(s): PROBNP in the last 8760 hours.  CBG: No results for input(s): GLUCAP in the last 168 hours.     Signed:  Rhetta Mura MD   [facility] 06/27/2020, 9:56 AM

## 2020-06-27 NOTE — Progress Notes (Signed)
SATURATION QUALIFICATIONS: (This note is used to comply with regulatory documentation for home oxygen)  Patient Saturations on Room Air at Rest = 86%  Patient Saturations on Room Air while Ambulating = N/A  Patient Saturations on  Liters of oxygen while Ambulating = N/A  Please briefly explain why patient needs home oxygen:  Patient O2 86% on room air at rest.

## 2020-06-28 ENCOUNTER — Telehealth: Payer: Self-pay | Admitting: *Deleted

## 2020-06-28 ENCOUNTER — Telehealth: Payer: Self-pay | Admitting: Medical

## 2020-06-28 NOTE — Telephone Encounter (Signed)
Transition Care Management Follow-up Telephone Call  Date of discharge and from where: 06/27/2020 -  [facility]  How have you been since you were released from the hospital? "He's very confused and disorientated"   Any questions or concerns? No  Items Reviewed:  Did the pt receive and understand the discharge instructions provided? Yes   Medications obtained and verified? Yes   Other? No   Any new allergies since your discharge? No   Dietary orders reviewed? Yes  Do you have support at home? Yes   Home Care and Equipment/Supplies: Were home health services ordered? not applicable If so, what is the name of the agency? N/A  Has the agency set up a time to come to the patient's home? not applicable Were any new equipment or medical supplies ordered?  No What is the name of the medical supply agency? N/A Were you able to get the supplies/equipment? not applicable Do you have any questions related to the use of the equipment or supplies? No  Functional Questionnaire: (I = Independent and D = Dependent) ADLs: D  Bathing/Dressing- D  Meal Prep- I  Eating- I  Maintaining continence- D  Transferring/Ambulation- I  Managing Meds- D  Follow up appointments reviewed:   PCP Hospital f/u appt confirmed? No  Patient will call to make appointment if necessary per family member, Annice Pih.  Specialist Hospital f/u appt confirmed? Yes  Scheduled to see Cardiology on 07/06/20 @ 1115 and 07/17/20 @ 1340.  Are transportation arrangements needed? No   If their condition worsens, is the pt aware to call PCP or go to the Emergency Dept.? Yes  Was the patient provided with contact information for the PCP's office or ED? Yes  Was to pt encouraged to call back with questions or concerns? Yes

## 2020-06-28 NOTE — Telephone Encounter (Signed)
° °  James Greer was notified pt's appt was r/s on next Friday. She would like to stick with [place]. She would like to request if Dot Lanes can see pt as soon as possible for f/u. She would like to speak with Krista's nurse as well

## 2020-06-28 NOTE — Telephone Encounter (Signed)
I called and spoke with the patient's daughter in law, Annice Pih (ok per DPR). I advised I was calling her back about the patient's appointment with Dot Lanes. Per Annice Pih, she states she was unsure why the appointment with Dot Lanes was being cancelled for this Friday 1/28. She states she spoke with [place] yesterday and was told she would follow up with him on Friday either in person or by telehealth visit depending on how he is doing.   I confirmed with Annice Pih that the patient is currently out of the hospital and back a [facility].  I advised I was uncertain what lead up to the patient's appointment being cancelled, but I will message Dot Lanes for her thoughts and we will need to call her back.   Per Annice Pih, the patient would most likely need to be Virtual Visit at this time as he his in memory care and just getting settled back in to being there.  Annice Pih was appreciative for the call back.

## 2020-06-29 ENCOUNTER — Ambulatory Visit: Payer: Medicare HMO | Admitting: Medical

## 2020-07-03 ENCOUNTER — Telehealth: Payer: Self-pay | Admitting: Cardiology

## 2020-07-03 DIAGNOSIS — Z79899 Other long term (current) drug therapy: Secondary | ICD-10-CM

## 2020-07-03 NOTE — Progress Notes (Signed)
Cardiology Office Note   Date:  07/06/2020   ID:  James Greer, DOB Jul 13, 1933, MRN [id number]  PCP:  Kermit Balo, DO  Cardiologist:  Dr. Tresa Endo  No chief complaint on file.    History of Present Illness: James Greer is a 87 y.o. male who presents for posthospitalization follow-up after being seen on consultation by Dr. Royann Shivers on 06/25/2020 for evaluation of CHF at the request of the hospitalist service. James Greer has a history of permanent atrial fibrillation, hypertension, hyperlipidemia, severe LVH, chronic kidney disease stage III, and dementia.  He is a resident of [facility].  Dr. Royann Shivers saw the patient with the assistance of his daughter-in-law via telephone to help fill in the blanks during his initial assessment.  The daughter-in-law did report that the patient had persistently low heart rates despite recent discontinuation of digoxin and lowering the dose of metoprolol tartrate to 12.5 mg twice daily after being seen by PCP.  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  It was noted prior to admission he had been hypertensive with bradycardia and was intermittently tachypneic and hypoxic at 83%.  He was provided oxygen via nasal cannula.  Chest x-ray revealed CHF with increased layering right pleural effusion.  He was found to be in atrial fibrillation per EKG and cardiac monitor.  He was treated with IV Lasix in the ED.    James Greer did have substantial diuresis reducing his weight from 89kg to 77 kg during hospitalization.  His creatinine remained stable.  He was transitioned to 40 mg of Lasix p.o. daily.  He was sent home on oxygen 2 L via nasal cannula.  He remained bradycardic but was a poor candidate for permanent pacemaker due to high risk  of lead dislodgment.  He was to continue on Eliquis, but metoprolol was discontinued on discharge.  He comes today with his caregiver who moved with him from [place], and his daughter-in-law on the phone via FaceTime.  James Greer has significant dementia and is unable to recall recent events of hospitalization.  His daughter-in-law does feel in information and has numerous questions concerning his heart failure, medication regimen, and maintenance.  She states that he has having some swelling in his lower extremities but not significantly.  She does send him food from home which she makes for him.  She states that it gives him so much joy and she wanted to make sure that he is getting the foods that he likes.  His caregiver, states that she is with him throughout the day, but he does get weighed daily, he gets up and walks around a lot in the hallway.  He does not require any extra pillows for more comfortable sleep, preferring the bed to be flat despite her efforts of keeping the head of the bed raised.  He has a good appetite, and is very cooperative.  There are no concerns of bleeding, excessive bruising, and he has not fallen since returning home from the hospital.  Past Medical History:  Diagnosis Date  . Carpal tunnel syndrome   . Chronic atrial fibrillation, unspecified (HCC)   . Chronic kidney disease   . Chronic kidney disease, stage 3a (HCC)   . Degenerative disease of nervous system (HCC)   . Hyperlipidemia   . Localized edema   .  Male erectile disorder   . Other thrombophilia (HCC)   . Personal history of other (healed) physical injury and trauma   . Rhinitis, allergic   . Toe fracture, left 05/27/2020   fracture of the proximal phalanx of the left first toe  . Trigger finger   . Type 2 diabetes mellitus (HCC)   . Unspecified convulsions (HCC)   . Unspecified dementia with behavioral disturbance (HCC)   . Urolithiasis     Past Surgical History:  Procedure Laterality Date  .  CRANIOTOMY     with evacuation of subdural hematoma  . EXTRACORPOREAL SHOCK WAVE LITHOTRIPSY       Current Outpatient Medications  Medication Sig Dispense Refill  . acetaminophen (TYLENOL) 500 MG tablet Take 650 mg by mouth in the morning, at noon, in the evening, and at bedtime.    Marland Kitchen apixaban (ELIQUIS) 5 MG TABS tablet Take 1 tablet (5 mg total) by mouth 2 (two) times daily. 60 tablet 3  . atorvastatin (LIPITOR) 40 MG tablet Take 40 mg by mouth daily.    . furosemide (LASIX) 40 MG tablet Take 1 tablet (40 mg total) by mouth 2 (two) times daily. 60 tablet 0  . Glucerna (GLUCERNA) LIQD Take 237 mLs by mouth 2 (two) times daily between meals.    . hydroxypropyl methylcellulose / hypromellose (ISOPTO TEARS / GONIOVISC) 2.5 % ophthalmic solution Place 1 drop into both eyes 3 (three) times daily as needed for dry eyes.    . Multiple Vitamins-Minerals (MULTI-DAY PLUS MINERALS PO) Take 18 mg by mouth. Iron-400 mcg-25 mcg; amt: 1 tablet; oral. Once a morning    . OVER THE COUNTER MEDICATION Take by mouth See admin instructions. 2 fiber thins with 8 ounces of water,juice or hot tea every morning.    Marland Kitchen POTASSIUM CHLORIDE PO Take 10 mEq by mouth.    . QUEtiapine (SEROQUEL) 50 MG tablet Take 1.5 tablets (75 mg total) by mouth at bedtime. Taking 1 & 1/2 tablets= 75 mg 15 tablet 0  . sodium fluoride (PREVIDENT 5000 PLUS) 1.1 % CREA dental cream Place 1 application onto teeth every evening.    . traZODone (DESYREL) 50 MG tablet Take 1 tablet (50 mg total) by mouth at bedtime. For sleep 6 tablet 0   No current facility-administered medications for this visit.    Allergies:   Patient has no known allergies.    Social History:  The patient  reports that he has never smoked. He has never used smokeless tobacco. He reports previous alcohol use. He reports previous drug use.   Family History:  The patient's family history includes Aneurysm in his sister.    ROS: All other systems are reviewed and  negative. Unless otherwise mentioned in H&P    PHYSICAL EXAM: VS:  BP 138/82   Pulse 66   Ht 6\' 1"  (1.854 m)   Wt 183 lb (83 kg)   SpO2 98%   BMI 24.14 kg/m  , BMI Body mass index is 24.14 kg/m. GEN: Well nourished, well developed, in no acute distress HEENT: normal Neck: no JVD, carotid bruits, or masses Cardiac: IRRR; no murmurs, rubs, or gallops,non pitting edema  Respiratory:  Clear to auscultation bilaterally, normal work of breathing GI: soft, nontender, nondistended, + BS MS: no deformity or atrophy Skin: warm and dry, no rash Neuro:  Strength and sensation are intact.  Responds but not appropriately.  Cooperative. Psych: euthymic mood, full affect   EKG: Not completed this office visit.  Recent Labs: 01/17/2020: TSH 2.36 06/24/2020: ALT 35; Hemoglobin 14.0; Platelets 203 06/27/2020: B Natriuretic Peptide 407.0; BUN 37; Creatinine, Ser 1.29; Magnesium 2.2; Potassium 3.6; Sodium 147    Lipid Panel No results found for: CHOL, TRIG, HDL, CHOLHDL, VLDL, LDLCALC, LDLDIRECT    Wt Readings from Last 3 Encounters:  07/06/20 183 lb (83 kg)  06/27/20 169 lb 12.1 oz (77 kg)  06/22/20 189 lb 3.2 oz (85.8 kg)      Other studies Reviewed: Echocardiogram 2020/06/15: 1. There is severe concentric LVH up to 2.4 cm. LV function is preserved.  There is increased thickness of the RV as well and a small pericardial  effusion. The patient has normal BP and no history of uncontrolled  hypertension. Strain imaing not  performed. Given the patient's age, and severe concentric LVH, would  consider work-up for cardiac amyloidosis vs other infiltrative  cardiomyopathy. Left ventricular ejection fraction, by estimation, is 65  to 70%. The left ventricle has normal function.  The left ventricle has no regional wall motion abnormalities. There is  severe concentric left ventricular hypertrophy. Left ventricular diastolic  function could not be evaluated.  2. Right ventricular  systolic function is mildly reduced. The right  ventricular size is mildly enlarged. Moderately increased right  ventricular wall thickness. There is normal pulmonary artery systolic  pressure. The estimated right ventricular systolic  pressure is 31.5 mmHg.  3. Left atrial size was mildly dilated.  4. Right atrial size was mildly dilated.  5. A small pericardial effusion is present. The pericardial effusion is  posterior to the left ventricle and the left atrium.  6. The mitral valve is grossly normal. Mild mitral valve regurgitation.  No evidence of mitral stenosis.  7. The aortic valve is tricuspid. Aortic valve regurgitation is not  visualized. No aortic stenosis is present.  8. The inferior vena cava is normal in size with greater than 50%  respiratory variability, suggesting right atrial pressure of 3 mmHg.    ASSESSMENT AND PLAN:  1.  Chronic diastolic CHF: Status post hospitalization for acute exacerbation.  He is now at [facility] living facility where he is cared for by staff and personal caregiver.  Weight is up 4 pounds from discharge, but it is a different scale and he is fully clothed.  He does not appear to be fluid overloaded at this time.  I will not make any changes in his Lasix dose.  I have answered multiple questions from his daughter-in-law concerning his Lasix and when to take it concerning weight gain.  I have explained to her and put in written form in orders for the facility that if he gains 2 to 3 pounds in 24 hours or 5 pounds in 2 to 3 days he will take an additional Lasix 40 mg BIB for 2 days and then return to twice daily dosing.  He is also advised to weigh daily on the same scale wearing the same clothes.  I will check a BMET today.  I have also spoken at length with his daughter-in-law not to send foods from home that she makes for him.  She states that she does not restrict salt but she feels that this is one of his things that he enjoys doing, eating  home-cooked meals.  I have advised her against adding salt and doing her best to eliminate as much of that as she can from his foods.  I have also written an order for a no salt diet at [facility].  She is reluctant to do this as she feels as that is the only pleasure he has left concerning his life as he has lost his wife and now has dementia.  I have explained to her the risks and benefits and the need to avoid hospitalization.  She verbalizes understanding.  2.  Atrial fibrillation: He is on apixaban 5 mg twice daily.  May need adjustment of dose based upon BMET concerning kidney function.  Will make recommendations once labs are available to review.  Heart rate is slightly elevated.  We will follow this.  He had syncopal episodes on diltiazem which was added for heart rate control.  May need to consider other heart rate medications if this is persistent.   3.  Dementia: This is making the appointment difficult as the patient is unable to answer questions with accuracy.  Dependent on daughter-in-law and caregiver for information.  He is quite cooperative and friendly however.  He is in the memory care portion of [facility].   Current medicines are reviewed at length with the patient today.  I have spent 45 min's  dedicated to the care of this patient on the date of this encounter to include pre-visit review of records, assessment, management and diagnostic testing,with shared decision making.  Labs/ tests ordered today include: BMET  They are having a that lunch is available today is well Bettey Mare. Liborio Nixon, ANP, AACC   07/06/2020 12:18 PM    [facility] [address] Office [phone] Fax [phone]  Notice: This dictation was prepared with Dragon dictation along with smaller phrase technology. Any transcriptional errors that result from this process are unintentional and may not be corrected upon review.

## 2020-07-03 NOTE — Telephone Encounter (Signed)
Pt c/o swelling: STAT is pt has developed SOB within 24 hours  1) How much weight have you gained and in what time span? Annice Pih called in and stated she was not sure of the exact weight gained but we could call [phone] head nurse is Danielle   2) If swelling, where is the swelling located? Legs and feet   3) Are you currently taking a fluid pill? Yes she is thinks this has been increased but not sure what it has been increased too.    4) Are you currently SOB? No SOB as of right now   5) Do you have a log of your daily weights (if so, list)? No   6) Have you gained 3 pounds in a day or 5 pounds in a week? NA   7) Have you traveled recently? No   Daughter in law called and stated he was just discharged last thurs for the same thing along with fluid in his lungs.  He has an appt this Friday but just want to keep him from going back in the hosp.  They would like a call back from the nurse to see what they should do until then   Best number  [phone]

## 2020-07-03 NOTE — Telephone Encounter (Signed)
Returned the call to the daughter in Social worker. She stated that the patient was discharged from the hospital on 06/27/20 with heart failure symptoms. The patient was discharged on 40 mg Furosemide bid with instructions to call cardiology if he gained more than 4 pounds.   His daughter in law stated that the patient is currently at [facility] and has gained 5-6 pounds since discharge. He has bilateral edema in his feet and legs. He denies shortness of breath.   The patient has an appointment on 07/06/20 but the daughter in law wants to know if the patient should take an extra Furosemide before then. The daughter in law wants to stay ahead of the swelling.

## 2020-07-04 ENCOUNTER — Non-Acute Institutional Stay (SKILLED_NURSING_FACILITY): Payer: Medicare HMO | Admitting: Internal Medicine

## 2020-07-04 ENCOUNTER — Encounter: Payer: Self-pay | Admitting: Internal Medicine

## 2020-07-04 DIAGNOSIS — G301 Alzheimer's disease with late onset: Secondary | ICD-10-CM | POA: Diagnosis not present

## 2020-07-04 DIAGNOSIS — I509 Heart failure, unspecified: Secondary | ICD-10-CM

## 2020-07-04 DIAGNOSIS — F0281 Dementia in other diseases classified elsewhere with behavioral disturbance: Secondary | ICD-10-CM | POA: Diagnosis not present

## 2020-07-04 DIAGNOSIS — F02818 Dementia in other diseases classified elsewhere, unspecified severity, with other behavioral disturbance: Secondary | ICD-10-CM

## 2020-07-04 NOTE — Telephone Encounter (Signed)
Spoke with patient's daughter in law. Ok for additional 40mg  of Lasix X 2 days. Patient had extra dose yesterday and today, will not get an extra dose tomorrow. BMP order placed for patient to have at office visit. No further questions at this time.

## 2020-07-04 NOTE — Progress Notes (Signed)
Location:  [facility] Room Number: 315 Place of Service:  SNF (31) Provider:  Irem Stoneham L. Renato Gails, D.O., C.M.D.  Kermit Balo, DO  Patient Care Team: Kermit Balo, DO as PCP - General (Geriatric Medicine) Lennette Bihari, MD as PCP - Cardiology (Cardiology) Community, [facility] (Skilled Nursing Facility) Kermit Balo, DO (Geriatric Medicine)  Extended Emergency Contact Information Primary Emergency Contact: [name] Address: [address]          [place], [place] [address] Darden Amber of [place] Home Phone: [phone] Relation: Relative Secondary Emergency Contact: [name] Address: [address]          [place], [place] [address] Darden Amber of [place] Home Phone: [phone] Mobile Phone: [phone] Relation: Son  Code Status:  DNR Goals of care: Advanced Directive information Advanced Directives 06/24/2020  Does Patient Have a Medical Advance Directive? Yes  Type of Estate agent of [place];Out of facility DNR (pink MOST or yellow form)  Does patient want to make changes to medical advance directive? No - Guardian declined  Copy of Healthcare Power of Attorney in Chart? No - copy requested  Would patient like information on creating a medical advance directive? No - Guardian declined  Pre-existing out of facility DNR order (yellow form or pink MOST form) Pink Most/Yellow Form available - Physician notified to receive inpatient order     Chief Complaint  Patient presents with  . Acute Visit    swelling    HPI:  Pt is a 87 y.o. male seen today for an acute visit for increased swelling of his legs, weight gain on 2/1 of 6.8 lbs.  He got a 3rd dose of lasix yesterday and again today.  Weight here 2/1 was 173.8 but today reportedly 176 lbs.  Unclear if this was taken at the same time with the same amount of clothing because staff who did the weight no longer here this afternoon  when I saw him and his caregiver also gone.  He is seated by himself in the living room area of memory care.  His legs are down and he has pitting edema of both despite the compression stockings.  Left where he's had great toe fx and possible meniscal tear at knee is more swollen than right.  He has not appeared short of breath when walking around the unit.  He is unable to provide history and will not permit me to touch him at this time.  (late afternoon/early evening)  Past Medical History:  Diagnosis Date  . Carpal tunnel syndrome   . Chronic atrial fibrillation, unspecified (HCC)   . Chronic kidney disease   . Chronic kidney disease, stage 3a (HCC)   . Degenerative disease of nervous system (HCC)   . Hyperlipidemia   . Localized edema   . Male erectile disorder   . Other thrombophilia (HCC)   . Personal history of other (healed) physical injury and trauma   . Rhinitis, allergic   . Toe fracture, left 05/27/2020   fracture of the proximal phalanx of the left first toe  . Trigger finger   . Type 2 diabetes mellitus (HCC)   . Unspecified convulsions (HCC)   . Unspecified dementia with behavioral disturbance (HCC)   . Urolithiasis    Past Surgical History:  Procedure Laterality Date  . CRANIOTOMY     with evacuation of subdural hematoma  . EXTRACORPOREAL SHOCK WAVE LITHOTRIPSY  No Known Allergies  Outpatient Encounter Medications as of 07/04/2020  Medication Sig  . acetaminophen (TYLENOL) 500 MG tablet Take 650 mg by mouth in the morning, at noon, in the evening, and at bedtime.  Marland Kitchen apixaban (ELIQUIS) 5 MG TABS tablet Take 1 tablet (5 mg total) by mouth 2 (two) times daily.  Marland Kitchen atorvastatin (LIPITOR) 40 MG tablet Take 40 mg by mouth daily.  . furosemide (LASIX) 40 MG tablet Take 1 tablet (40 mg total) by mouth 2 (two) times daily.  . Glucerna (GLUCERNA) LIQD Take 237 mLs by mouth 2 (two) times daily between meals.  . hydroxypropyl methylcellulose / hypromellose (ISOPTO TEARS /  GONIOVISC) 2.5 % ophthalmic solution Place 1 drop into both eyes 3 (three) times daily as needed for dry eyes.  . Multiple Vitamins-Minerals (MULTI-DAY PLUS MINERALS PO) Take 18 mg by mouth. Iron-400 mcg-25 mcg; amt: 1 tablet; oral. Once a morning  . OVER THE COUNTER MEDICATION Take by mouth See admin instructions. 2 fiber thins with 8 ounces of water,juice or hot tea every morning.  Marland Kitchen POTASSIUM CHLORIDE PO Take 10 mEq by mouth.  . QUEtiapine (SEROQUEL) 50 MG tablet Take 1.5 tablets (75 mg total) by mouth at bedtime. Taking 1 & 1/2 tablets= 75 mg  . sodium fluoride (PREVIDENT 5000 PLUS) 1.1 % CREA dental cream Place 1 application onto teeth every evening.  . traZODone (DESYREL) 50 MG tablet Take 1 tablet (50 mg total) by mouth at bedtime. For sleep   No facility-administered encounter medications on file as of 07/04/2020.    Review of Systems  Constitutional: Negative for chills and fever.  HENT: Negative for congestion and sore throat.   Respiratory: Negative for shortness of breath.   Cardiovascular: Positive for leg swelling. Negative for chest pain and palpitations.  Gastrointestinal: Negative for abdominal pain.  Genitourinary: Negative for dysuria.  Musculoskeletal: Positive for joint pain. Negative for falls.       C/o left knee pain intermittently  Neurological: Negative for dizziness and loss of consciousness.  Psychiatric/Behavioral: Positive for memory loss.    Immunization History  Administered Date(s) Administered  . Influenza, High Dose Seasonal PF 03/26/2020  . Influenza-Unspecified 03/11/2011, 03/08/2012, 03/02/2013, 08/09/2013, 02/27/2016, 03/02/2017, 01/01/2019, 03/26/2020  . Moderna Sars-Covid-2 Vaccination 07/05/2019, 08/12/2019, 02/04/2020  . Pneumococcal Conjugate-13 06/13/2009  . Zoster 04/02/2010  . Zoster Recombinat (Shingrix) 05/03/2020   Pertinent  Health Maintenance Due  Topic Date Due  . FOOT EXAM  Never done  . URINE MICROALBUMIN  Never done  . PNA vac  Low Risk Adult (2 of 2 - PPSV23) 06/13/2010  . HEMOGLOBIN A1C  07/19/2020  . OPHTHALMOLOGY EXAM  04/05/2021  . INFLUENZA VACCINE  Completed   Fall Risk  03/17/2020 03/14/2020  Falls in the past year? 1 1  Number falls in past yr: 1 0  Injury with Fall? 1 1  Risk for fall due to : History of fall(s);Impaired balance/gait;Impaired mobility;Mental status change;Medication side effect -  Follow up Falls evaluation completed;Education provided;Falls prevention discussed;Follow up appointment -   Functional Status Survey:    Vitals:   07/04/20 0735  BP: (!) 162/80  Pulse: 60  Resp: 16  Temp: 97.6 F (36.4 C)  SpO2: 98%  Weight: 176 lb (79.8 kg)   Body mass index is 23.22 kg/m. Physical Exam Vitals reviewed.  Constitutional:      General: He is not in acute distress.    Appearance: He is not toxic-appearing.  HENT:     Head: Normocephalic and  atraumatic.  Pulmonary:     Effort: Pulmonary effort is normal.  Musculoskeletal:     Right lower leg: Edema present.     Left lower leg: Edema present.  Neurological:     Mental Status: He is alert.     Gait: Gait abnormal.  Psychiatric:     Comments: Refusing to allow me to touch his ankles or listen to his heart at this time of day     Labs reviewed: Recent Labs    06/26/20 0956 06/27/20 0550 07/06/20 1304  NA 145 147* 143  K 3.0* 3.6 4.5  CL 101 105 100  CO2 31 30 30*  GLUCOSE 115* 105* 71  BUN 40* 37* 30*  CREATININE 1.50* 1.29* 1.39*  CALCIUM 9.1 9.0 9.2  MG 2.3 2.2  --   PHOS 4.1 4.0  --    Recent Labs    01/23/20 0000 06/24/20 1611 06/26/20 0956 06/27/20 0550  AST 27 42*  --   --   ALT 23 35  --   --   ALKPHOS  --  77  --   --   BILITOT  --  2.0*  --   --   PROT  --  7.7  --   --   ALBUMIN  --  4.0 3.8 3.3*   Recent Labs    01/17/20 0000 06/24/20 1611  WBC  --  5.2  NEUTROABS  --  3.1  HGB 13.1* 14.0  HCT 40* 43.3  MCV  --  95.8  PLT  --  203   Lab Results  Component Value Date   TSH 2.36  01/17/2020   Lab Results  Component Value Date   HGBA1C 6.7 01/17/2020   No results found for: CHOL, HDL, LDLCALC, LDLDIRECT, TRIG, CHOLHDL  Significant Diagnostic Results in last 30 days:  DG Chest 2 View  Result Date: 06/24/2020 CLINICAL DATA:  CHF.  Shortness of breath.  Diabetes.  Dementia. EXAM: CHEST - 2 VIEW COMPARISON:  05/27/2020. FINDINGS: Both views are motion degraded. Patient rotated left on the frontal. Numerous leads and wires project over the chest. Midline trachea. Mild cardiomegaly. Layering right-sided pleural effusion may be slightly increased. No pneumothorax. Interstitial prominence and indistinctness is not significantly changed. Airspace disease throughout the lower lungs is greater right than left, slightly progressive at the right base. IMPRESSION: Congestive heart failure with slight increase in layering right pleural effusion. Worsened right base aeration, atelectasis or concurrent infection. Relatively diffuse pulmonary opacities elsewhere are likely indicative of alveolar similar alveolar edema. Electronically Signed   By: Jeronimo Greaves M.D.   On: 06/24/2020 17:04   DG Chest Port 1 View  Result Date: 06/27/2020 CLINICAL DATA:  CHF. EXAM: PORTABLE CHEST 1 VIEW COMPARISON:  06/24/2020. FINDINGS: Exam limited as the patient was combative. Cardiomegaly again noted. Progressive right lower lobe atelectasis and consolidation. Diffuse bilateral pulmonary infiltrates/edema again noted. Bilateral pleural effusions again noted. No pneumothorax. IMPRESSION: 1. Progressive right lower lobe atelectasis and consolidation. Diffuse bilateral pulmonary infiltrates/edema again noted. Bilateral pleural effusions again noted. 2. Cardiomegaly again noted. Electronically Signed   By: Maisie Fus  Register   On: 06/27/2020 05:57   XR Toe Great Left  Result Date: 06/14/2020 X-rays of the left great toe reveal anatomic alignment of the fracture, not a lot of callus formation  yet.   Assessment/Plan 1. Acute congestive heart failure, unspecified heart failure type (HCC) -I'm not convinced the weights are correct b/c he's gotten extra lasix yesterday and today and  weight went up -if weight does not trend down tomorrow, evening nurse was advised that we should be notified tomorrow -consistent timing of weights is crucial but challenging as he resists care often  2. Late onset Alzheimer's disease with behavioral disturbance (HCC) -I need to speak with his daughter to discuss his goals of care  -he has developed increasing medical illnesses and the testing, treatments, monitoring are difficult for him to undergo in view of his dementia  Family/ staff Communication: d/w memory care nurse  Labs/tests ordered:  No new added  Marixa Mellott L. Bhakti Labella, D.O. Geriatrics [facility] [address], [place] [address] Cell Phone (Mon-Fri 8am-5pm):  [phone] On Call:  [phone] & follow prompts after 5pm & weekends Office Phone:  [phone] Office Fax:  [phone]

## 2020-07-04 NOTE — Telephone Encounter (Signed)
Looks like he could take an extra 40 mg Lasix today and tomorrow but he will need a BMET in the office on the 4th.

## 2020-07-06 ENCOUNTER — Other Ambulatory Visit: Payer: Self-pay

## 2020-07-06 ENCOUNTER — Ambulatory Visit (INDEPENDENT_AMBULATORY_CARE_PROVIDER_SITE_OTHER): Payer: Medicare HMO | Admitting: Adult Health

## 2020-07-06 ENCOUNTER — Encounter: Payer: Self-pay | Admitting: Adult Health

## 2020-07-06 VITALS — BP 138/82 | HR 66 | Ht 73.0 in | Wt 183.0 lb

## 2020-07-06 DIAGNOSIS — Z79899 Other long term (current) drug therapy: Secondary | ICD-10-CM

## 2020-07-06 DIAGNOSIS — G301 Alzheimer's disease with late onset: Secondary | ICD-10-CM

## 2020-07-06 DIAGNOSIS — I5033 Acute on chronic diastolic (congestive) heart failure: Secondary | ICD-10-CM

## 2020-07-06 DIAGNOSIS — F02818 Dementia in other diseases classified elsewhere, unspecified severity, with other behavioral disturbance: Secondary | ICD-10-CM

## 2020-07-06 DIAGNOSIS — I4811 Longstanding persistent atrial fibrillation: Secondary | ICD-10-CM | POA: Diagnosis not present

## 2020-07-06 DIAGNOSIS — F0281 Dementia in other diseases classified elsewhere with behavioral disturbance: Secondary | ICD-10-CM

## 2020-07-06 NOTE — Patient Instructions (Addendum)
Medication Instructions:  Continue current medications  *If you need a refill on your cardiac medications before your next appointment, please call your pharmacy*   Lab Work: BMP today  Testing/Procedures: None Ordered   Follow-Up: At [facility], you and your health needs are our priority.  As part of our continuing mission to provide you with exceptional heart care, we have created designated Provider Care Teams.  These Care Teams include your primary Cardiologist (physician) and Advanced Practice Providers (APPs -  Physician Assistants and Nurse Practitioners) who all work together to provide you with the care you need, when you need it.  We recommend signing up for the patient portal called "MyChart".  Sign up information is provided on this After Visit Summary.  MyChart is used to connect with patients for Virtual Visits (Telemedicine).  Patients are able to view lab/test results, encounter notes, upcoming appointments, etc.  Non-urgent messages can be sent to your provider as well.   To learn more about what you can do with MyChart, go to [email or web address].    Your next appointment:   Keep appointment with Dr Antoine Poche on February 15th  The format for your next appointment:   In Person  Provider:   Rollene Rotunda, MD

## 2020-07-07 LAB — BASIC METABOLIC PANEL
BUN/Creatinine Ratio: 22 (ref 10–24)
BUN: 30 mg/dL — ABNORMAL HIGH (ref 8–27)
CO2: 30 mmol/L — ABNORMAL HIGH (ref 20–29)
Calcium: 9.2 mg/dL (ref 8.6–10.2)
Chloride: 100 mmol/L (ref 96–106)
Creatinine, Ser: 1.39 mg/dL — ABNORMAL HIGH (ref 0.76–1.27)
GFR calc Af Amer: 53 mL/min/{1.73_m2} — ABNORMAL LOW (ref >59)
GFR calc non Af Amer: 46 mL/min/{1.73_m2} — ABNORMAL LOW (ref >59)
Glucose: 71 mg/dL (ref 65–99)
Potassium: 4.5 mmol/L (ref 3.5–5.2)
Sodium: 143 mmol/L (ref 134–144)

## 2020-07-13 ENCOUNTER — Encounter: Payer: Self-pay | Admitting: Internal Medicine

## 2020-07-15 NOTE — Progress Notes (Unsigned)
Cardiology Office Note   Date:  07/17/2020   ID:  James Greer, DOB [birth year], MRN [id number]  PCP:  Kermit Balo, DO  Cardiologist:   Rollene Rotunda, MD   Chief Complaint  Patient presents with  . Atrial Fibrillation      History of Present Illness: James Greer is a 87 y.o. male who presents for a first visit with me.  He has a history of permanent atrial fibrillation, hypertension, hyperlipidemia, severe LVH, chronic kidney disease stage III, and dementia.  He is a resident of [facility].   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  He has had bradycardia with discontinuation of digoxin and a lower dose of metoprolol.   At the last appt his beta blocker was stopped.   He was hospitalized in Jan with acute on chronic diastolic HF.    This is his first visit with me.  I was able to review previous records.  He was previously an Technical sales engineer.  He moved here from [place].  I spoke with his daughter-in-law during this visit.  She was on the phone calling from [facility].  They are very involved.  He lives in [place] and seems to have advanced dementia but is very pleasant.  He is here with his caregiver.  He cannot give any details.  Today ironically he had an episode while walking with his caregiver where he suddenly turned to the wall and then began to look like he was going to pass out.  They were able to ease him to the floor.  There may have been a very brief loss of consciousness.  There was no trauma.  He has had persistently slow atrial rates his fibrillation that is chronic.  This is despite now being off of all negative chronotropes.  He has chronic dyspnea but this seems to be baseline.  He has had chronic diastolic heart failure related to the LVH but his swelling has been controlled with as needed dosing of his diuretics, daily weights and compression stockings.   His renal function has been stable with current dosing.  Past Medical History:  Diagnosis Date  . Carpal tunnel syndrome   . Chronic atrial fibrillation, unspecified (HCC)   . Chronic kidney disease, stage 3a (HCC)   . Degenerative disease of nervous system (HCC)   . Hyperlipidemia   . Localized edema   . Other thrombophilia (HCC)   . Rhinitis, allergic   . Toe fracture, left 05/27/2020   fracture of the proximal phalanx of the left first toe  . Trigger finger   . Type 2 diabetes mellitus (HCC)   . Unspecified convulsions (HCC)   . Unspecified dementia with behavioral disturbance (HCC)   . Urolithiasis     Past Surgical History:  Procedure Laterality Date  . CRANIOTOMY     with evacuation of subdural hematoma  . EXTRACORPOREAL SHOCK WAVE LITHOTRIPSY       Current Outpatient Medications  Medication Sig Dispense Refill  . acetaminophen (TYLENOL) 500 MG tablet Take 650 mg by mouth in the morning, at noon, in the evening, and at bedtime.    Marland Kitchen apixaban (ELIQUIS) 5 MG TABS tablet Take 1 tablet (5 mg total) by mouth 2 (two) times daily. 60 tablet 3  . atorvastatin (LIPITOR) 40 MG tablet Take 40 mg by mouth daily.    . furosemide (LASIX) 40 MG tablet Take 1 tablet (40  mg total) by mouth 2 (two) times daily. 60 tablet 0  . Glucerna (GLUCERNA) LIQD Take 237 mLs by mouth 2 (two) times daily between meals.    . hydroxypropyl methylcellulose / hypromellose (ISOPTO TEARS / GONIOVISC) 2.5 % ophthalmic solution Place 1 drop into both eyes 3 (three) times daily as needed for dry eyes.    . Multiple Vitamins-Minerals (MULTI-DAY PLUS MINERALS PO) Take 18 mg by mouth. Iron-400 mcg-25 mcg; amt: 1 tablet; oral. Once a morning    . OVER THE COUNTER MEDICATION Take by mouth See admin instructions. 2 fiber thins with 8 ounces of water,juice or hot tea every morning.    Marland Kitchen POTASSIUM CHLORIDE PO Take 10 mEq by mouth.    . QUEtiapine (SEROQUEL) 50 MG tablet Take 1.5 tablets (75 mg total) by mouth at  bedtime. Taking 1 & 1/2 tablets= 75 mg 15 tablet 0  . sodium fluoride (PREVIDENT 5000 PLUS) 1.1 % CREA dental cream Place 1 application onto teeth every evening.    . traZODone (DESYREL) 50 MG tablet Take 1 tablet (50 mg total) by mouth at bedtime. For sleep 6 tablet 0   No current facility-administered medications for this visit.    Allergies:   Patient has no known allergies.    ROS:  Please see the history of present illness.   Otherwise, review of systems are positive for none.   All other systems are reviewed and negative.    PHYSICAL EXAM: VS:  BP 132/88   Pulse (!) 47   Ht 6\' 2"  (1.88 m)   Wt 188 lb 6.4 oz (85.5 kg)   SpO2 99%   BMI 24.19 kg/m  , BMI Body mass index is 24.19 kg/m. GENERAL:  Well appearing NECK:  No jugular venous distention, waveform within normal limits, carotid upstroke brisk and symmetric, no bruits, no thyromegaly LUNGS:  Clear to auscultation bilaterally CHEST:  Unremarkable HEART:  PMI not displaced or sustained,S1 and S2 within normal limits, no S3,  no clicks, no rubs, no murmurs, irregular ABD:  Flat, positive bowel sounds normal in frequency in pitch, no bruits, no rebound, no guarding, no midline pulsatile mass, no hepatomegaly, no splenomegaly EXT:  2 plus pulses throughout, mild bilateral leg edema, no cyanosis no clubbing, chronic venous stasis changes   EKG:  EKG is ordered today. The ekg ordered today demonstrates atrial fibrillation with a stroke ventricular rate, poor anterior R wave progression, right bundle branch block with right axis deviation   Recent Labs: 01/17/2020: TSH 2.36 06/24/2020: ALT 35; Hemoglobin 14.0; Platelets 203 06/27/2020: B Natriuretic Peptide 407.0; Magnesium 2.2 07/06/2020: BUN 30; Creatinine, Ser 1.39; Potassium 4.5; Sodium 143    Lipid Panel No results found for: CHOL, TRIG, HDL, CHOLHDL, VLDL, LDLCALC, LDLDIRECT    Wt Readings from Last 3 Encounters:  07/17/20 188 lb 6.4 oz (85.5 kg)  07/06/20 183 lb (83  kg)  07/04/20 176 lb (79.8 kg)      Other studies Reviewed: Additional studies/ records that were reviewed today include: Hospital records. Review of the above records demonstrates:  Please see elsewhere in the note.     ASSESSMENT AND PLAN:  CHRONIC DIASTOLIC HF: I had a long discussion with the patient, his daughter-in-law on the phone and his caregiver.  While he may well have amyloidosis I would suggest that he is best managed conservatively as they are doing with volume management, daily weights and reasonable but not quality of life compromising salt restriction.  I think his daughter-in-law satisfied  with this conversation.  I would not pursue PYP scanning or MRI.  PERMANENT ATRIAL FIB: I do think he might be having symptomatic bradycardia arrhythmias.  He has been off of all of his AV nodal blocking agents and has rates in the 40s.  This was documented routinely.  He is having events that might be related to that or longer pauses.  I think it would be very problematic to try to get him to wear any kind of a monitor.  He does have a good quality of life despite his dementia.  He would be at risk for trauma if he were to have frank sudden syncope.  I think it is reasonable to consider insertion of a leadless pacemaker and I will refer him to EP to have this conversation.  HTN: Blood pressures well controlled.  No change in therapy.  CKD III: Renal function is stable.  Continue to follow.  Current medicines are reviewed at length with the patient today.  The patient does not have concerns regarding medicines.  The following changes have been made:  no change  Labs/ tests ordered today include:   Orders Placed This Encounter  Procedures  . Ambulatory referral to Cardiac Electrophysiology  . EKG 12-Lead     Disposition:   FU with me in about 6 months.     Signed, Rollene Rotunda, MD  07/17/2020 5:11 PM    [facility]

## 2020-07-16 ENCOUNTER — Encounter: Payer: Self-pay | Admitting: Cardiology

## 2020-07-16 DIAGNOSIS — I10 Essential (primary) hypertension: Secondary | ICD-10-CM | POA: Insufficient documentation

## 2020-07-17 ENCOUNTER — Encounter: Payer: Self-pay | Admitting: Cardiology

## 2020-07-17 ENCOUNTER — Other Ambulatory Visit: Payer: Self-pay

## 2020-07-17 ENCOUNTER — Ambulatory Visit (INDEPENDENT_AMBULATORY_CARE_PROVIDER_SITE_OTHER): Payer: Medicare HMO | Admitting: Cardiology

## 2020-07-17 VITALS — BP 132/88 | HR 47 | Ht 74.0 in | Wt 188.4 lb

## 2020-07-17 DIAGNOSIS — N1831 Chronic kidney disease, stage 3a: Secondary | ICD-10-CM

## 2020-07-17 DIAGNOSIS — I10 Essential (primary) hypertension: Secondary | ICD-10-CM

## 2020-07-17 DIAGNOSIS — R001 Bradycardia, unspecified: Secondary | ICD-10-CM

## 2020-07-17 DIAGNOSIS — I4821 Permanent atrial fibrillation: Secondary | ICD-10-CM | POA: Diagnosis not present

## 2020-07-17 DIAGNOSIS — E785 Hyperlipidemia, unspecified: Secondary | ICD-10-CM

## 2020-07-17 DIAGNOSIS — I5032 Chronic diastolic (congestive) heart failure: Secondary | ICD-10-CM | POA: Diagnosis not present

## 2020-07-17 NOTE — Patient Instructions (Signed)
Medication Instructions:  No changes  Follow-Up: At [facility], you and your health needs are our priority.  As part of our continuing mission to provide you with exceptional heart care, we have created designated Provider Care Teams.  These Care Teams include your primary Cardiologist (physician) and Advanced Practice Providers (APPs -  Physician Assistants and Nurse Practitioners) who all work together to provide you with the care you need, when you need it.  Your next appointment:   6 month(s)  You will receive a reminder letter in the mail two months in advance. If you don't receive a letter, please call our office to schedule the follow-up appointment.  The format for your next appointment:   In Person  Provider:   Rollene Rotunda, MD  Other Instructions: Referral to Dr. Johney Frame.

## 2020-07-18 ENCOUNTER — Telehealth: Payer: Self-pay | Admitting: Cardiology

## 2020-07-18 ENCOUNTER — Encounter: Payer: Self-pay | Admitting: Cardiology

## 2020-07-18 NOTE — Telephone Encounter (Signed)
    STAT if HR is under 50 or over 120 (normal HR is 60-100 beats per minute)  1) What is your heart rate? Under 40  2) Do you have a log of your heart rate readings (document readings)?   3) Do you have any other symptoms?Annice Pih is calling, she said pt's HR is been under 40 today and pt is feeling dizzy. She said Dr. Antoine Poche told them to call if pt is have HR under 40

## 2020-07-18 NOTE — Telephone Encounter (Signed)
error 

## 2020-07-18 NOTE — Telephone Encounter (Signed)
Spoke to patient's daughter n law Annice Pih Dr.Hochrein's advice given.

## 2020-07-18 NOTE — Telephone Encounter (Signed)
Agree that there is not much to do beyond what was discussed.  If he is persistently symptomatic he would need to go to ED.

## 2020-07-18 NOTE — Telephone Encounter (Signed)
Spoke to patient's daughter n law Annice Pih.She stated patient lives at [facility] in memory unit.His pulse today has been 38,37,40.He has complained of dizziness.Advised if heart rate stays consistently low and he is dizzy he needs to go to ED. He has appointment with Dr.Allred 07/23/20 at 11:15 am.Stated Dr.Hochrein wanted to know if heart rate 40 or below.Advised I will send message to him.

## 2020-07-19 ENCOUNTER — Telehealth: Payer: Self-pay | Admitting: Adult Health

## 2020-07-19 NOTE — Telephone Encounter (Signed)
Spoke to [place] at [facility] calling to clarify prn Lasix dose.Stated patient's weight is up and he has sob with exertion. Increased swelling in both lower legs.Stated  2/8- weight 177 lbs 4 oz.  2/9- 180 lbs 8 oz. She gave pt Lasix 80 mg twice a day for 2 days. 2/16- 185 lbs 4 oz  Patient was given Lasix 80 mg twice a day. This morning weight still 185 lbs 4 oz.Advised ok to give Lasix 80 mg twice a day for 3 days then return to normal dose of Lasix 40 mg twice a day.Message sent to Dr,Hochrein for review.

## 2020-07-19 NOTE — Telephone Encounter (Signed)
Pt c/o medication issue:  1. Name of Medication: Lasix 20 MG's  2. How are you currently taking this medication (dosage and times per day)?   3. Are you having a reaction (difficulty breathing--STAT)? No   4. What is your medication issue? Duwayne Heck is calling requesting confirmation on how the patient is to take his Lasix due to receiving two different orders that advised the pt take the medication two different ways. She also states the way Kaileb has been taking this medication is not working for him. Please advise.

## 2020-07-20 NOTE — Telephone Encounter (Signed)
Spoke to [place] with [facility] Dr.Hochrein agreed with increasing Lasix to 80 mg twice a day for 3 days then return to normal dose.She stated today will be his 3rd dose.He continues to have swelling in both lower legs.Appointment scheduled with Edd Fabian NP 2/22 at 2:15 pm.

## 2020-07-20 NOTE — Telephone Encounter (Signed)
Agree. Thanks

## 2020-07-23 ENCOUNTER — Encounter: Payer: Self-pay | Admitting: Internal Medicine

## 2020-07-23 ENCOUNTER — Other Ambulatory Visit: Payer: Self-pay

## 2020-07-23 ENCOUNTER — Ambulatory Visit: Payer: Medicare HMO | Admitting: Internal Medicine

## 2020-07-23 VITALS — BP 158/80 | HR 110 | Ht 74.0 in | Wt 186.0 lb

## 2020-07-23 DIAGNOSIS — I4821 Permanent atrial fibrillation: Secondary | ICD-10-CM

## 2020-07-23 DIAGNOSIS — I5032 Chronic diastolic (congestive) heart failure: Secondary | ICD-10-CM | POA: Diagnosis not present

## 2020-07-23 DIAGNOSIS — R001 Bradycardia, unspecified: Secondary | ICD-10-CM | POA: Diagnosis not present

## 2020-07-23 DIAGNOSIS — I10 Essential (primary) hypertension: Secondary | ICD-10-CM | POA: Diagnosis not present

## 2020-07-23 NOTE — Progress Notes (Signed)
Cardiology Clinic Note   Patient Name: James Greer Date of Encounter: 07/24/2020  Primary Care Provider:  Kermit Balo, DO Primary Cardiologist:  Rollene Rotunda, MD  Patient Profile    James Greer 87 year old male presents the clinic today for follow-up evaluation of his chronic diastolic CHF.  Past Medical History    Past Medical History:  Diagnosis Date  . Carpal tunnel syndrome   . Chronic atrial fibrillation, unspecified (HCC)   . Chronic kidney disease, stage 3a (HCC)   . Degenerative disease of nervous system (HCC)   . Hyperlipidemia   . Localized edema   . Other thrombophilia (HCC)   . Rhinitis, allergic   . Toe fracture, left 05/27/2020   fracture of the proximal phalanx of the left first toe  . Trigger finger   . Type 2 diabetes mellitus (HCC)   . Unspecified convulsions (HCC)   . Unspecified dementia with behavioral disturbance (HCC)   . Urolithiasis    Past Surgical History:  Procedure Laterality Date  . CRANIOTOMY     with evacuation of subdural hematoma  . EXTRACORPOREAL SHOCK WAVE LITHOTRIPSY      Allergies  No Known Allergies  History of Present Illness    [name] has a PMH of paroxysmal atrial fibrillation, dyslipidemia, essential hypertension, stage III chronic kidney disease, bradycardia, and chronic diastolic CHF.  He also has severe LVH and dementia.  He is a resident at [facility] center.  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  He was noted to have bradycardia with the discontinuation of digoxin and a low-dose metoprolol.  His beta-blocker has been stopped.  He was hospitalized in January with acute on chronic CHF.  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  During his initial visit his daughter was on the phone from [facility] and was involved in his care.  He was last seen by Dr. Antoine Poche on 07/17/2020.  During that time  he presented with his caregiver who could not give any details.  It was reported that he had an episode prior to the visit where he looked as if he might pass out.  He was able to ease him to the floor.  It was reported that he may have had a brief episode where he lost his consciousness.  Reports of trauma were denied.  Of note he had persistently low atrial rates with his fibrillation which was chronic.  His chronic dyspnea was at baseline.  His lower extremity edema have been controlled with as needed diuresis, daily weights, and compression stockings.  His renal function remained stable.  He followed up with electrophysiology/Dr. Johney Frame 07/23/2020.  His atrial fibrillation was rate controlled.  His hypertension was stable.  His diastolic CHF was also stable.  He presents the clinic today for follow-up evaluation with his caregiver and relative James Greer on the phone.  They state they were present yesterday with the EP evaluation.  Dr. Johney Frame does not wish to implant PPM due to comorbidities.  His weight is stable today and he has generalized nonpitting bilateral lower extremity edema.  We will continue current therapy at this time.  We discussed [name] heart rate and continued current medication regimen.  We will plan follow-up with Dr. Antoine Poche in 3 months.  Today he and his caregiver deny chest pain, shortness of breath, lower extremity edema, fatigue, palpitations, melena, hematuria, hemoptysis, diaphoresis, weakness, presyncope, syncope, orthopnea, and PND.  Home Medications    Prior to Admission medications   Medication Sig Start Date End Date Taking? Authorizing Provider  acetaminophen (TYLENOL) 500 MG tablet Take 650 mg by mouth in the morning, at noon, in the evening, and at bedtime.    [provider]  apixaban (ELIQUIS) 5 MG TABS tablet Take 1 tablet (5 mg total) by mouth 2 (two) times daily. 05/29/20   Fenton, Clint R, PA  atorvastatin (LIPITOR) 40 MG tablet Take 40 mg by  mouth daily.    [provider]  furosemide (LASIX) 40 MG tablet Take 1 tablet (40 mg total) by mouth 2 (two) times daily. 06/27/20   Rhetta Mura, MD  Glucerna (GLUCERNA) LIQD Take 237 mLs by mouth 2 (two) times daily between meals.    [provider]  hydroxypropyl methylcellulose / hypromellose (ISOPTO TEARS / GONIOVISC) 2.5 % ophthalmic solution Place 1 drop into both eyes 3 (three) times daily as needed for dry eyes.    [provider]  Multiple Vitamins-Minerals (MULTI-DAY PLUS MINERALS PO) Take 18 mg by mouth. Iron-400 mcg-25 mcg; amt: 1 tablet; oral. Once a morning    [provider]  OVER THE COUNTER MEDICATION Take by mouth See admin instructions. 2 fiber thins with 8 ounces of water,juice or hot tea every morning.    [provider]  POTASSIUM CHLORIDE PO Take 10 mEq by mouth.    [provider]  QUEtiapine (SEROQUEL) 50 MG tablet Take 1.5 tablets (75 mg total) by mouth at bedtime. Taking 1 & 1/2 tablets= 75 mg 06/27/20   Rhetta Mura, MD  sodium fluoride (PREVIDENT 5000 PLUS) 1.1 % CREA dental cream Place 1 application onto teeth every evening.    [provider]  traZODone (DESYREL) 50 MG tablet Take 1 tablet (50 mg total) by mouth at bedtime. For sleep 06/27/20   Rhetta Mura, MD    Family History    Family History  Problem Relation Age of Onset  . Aneurysm Sister    He indicated that his mother is deceased. He indicated that his father is deceased. He indicated that his sister is deceased.  Social History    Social History   Socioeconomic History  . Marital status: Widowed    Spouse name: Not on file  . Number of children: 2  . Years of education: Not on file  . Highest education level: Not on file  Occupational History  . Not on file  Tobacco Use  . Smoking status: Never Smoker  . Smokeless tobacco: Never Used  Substance and Sexual Activity  . Alcohol use: Not Currently  . Drug  use: Not Currently  . Sexual activity: Not Currently  Other Topics Concern  . Not on file  Social History Narrative   ** Merged History Encounter **       Social Determinants of Health   Financial Resource Strain: Not on file  Food Insecurity: Not on file  Transportation Needs: Not on file  Physical Activity: Not on file  Stress: Not on file  Social Connections: Not on file  Intimate Partner Violence: Not on file     Review of Systems    General:  No chills, fever, night sweats or weight changes.  Cardiovascular:  No chest pain, dyspnea on exertion, edema, orthopnea, palpitations, paroxysmal nocturnal dyspnea. Dermatological: No rash, lesions/masses Respiratory: No cough, dyspnea Urologic: No hematuria, dysuria Abdominal:   No nausea, vomiting, diarrhea, bright red blood per rectum, melena, or hematemesis Neurologic:  No visual  changes, wkns, changes in mental status. All other systems reviewed and are otherwise negative except as noted above.  Physical Exam    VS:  BP 120/76   Pulse 94   Ht 6\' 2"  (1.88 m)   Wt 187 lb (84.8 kg)   SpO2 98%   BMI 24.01 kg/m  , BMI Body mass index is 24.01 kg/m. GEN: Well nourished, well developed, in no acute distress. HEENT: normal. Neck: Supple, no JVD, carotid bruits, or masses. Cardiac: RRR, no murmurs, rubs, or gallops. No clubbing, cyanosis, bilateral lower extremity generalized nonpitting edema.  Radials/DP/PT 2+ and equal bilaterally.  Respiratory:  Respirations regular and unlabored, clear to auscultation bilaterally. GI: Soft, nontender, nondistended, BS + x 4. MS: no deformity or atrophy. Skin: warm and dry, no rash. Neuro:  Strength and sensation are intact. Psych: Normal affect.  Accessory Clinical Findings    Recent Labs: 01/17/2020: TSH 2.36 06/24/2020: ALT 35; Hemoglobin 14.0; Platelets 203 06/27/2020: B Natriuretic Peptide 407.0; Magnesium 2.2 07/06/2020: BUN 30; Creatinine, Ser 1.39; Potassium 4.5; Sodium 143    Recent Lipid Panel No results Greer for: CHOL, TRIG, HDL, CHOLHDL, VLDL, LDLCALC, LDLDIRECT  ECG personally reviewed by me today-none today.    Assessment & Plan   1.  Chronic diastolic CHF-euvolemic today.  Weight remains stable.  No increased DOE or activity intolerance.  Previously noted to have amyloidosis which was discussed and agreed to proceed with conservative therapy.  PYP scanning or MRI not recommended. Continue furosemide Heart healthy low-sodium diet-salty 6 given Increase physical activity as tolerated Daily weights contact office with a weight increase of 3 pounds overnight or 5 pounds in 1 week Continue lower extremity support stockings Elevate lower extremities when active  Essential hypertension-BP today 120/76.  Well-controlled at home. Continue furosemide Heart healthy low-sodium diet-salty 6 given Increase physical activity as tolerated  Atrial fibrillation-permanent.  Rate controlled.  On last visit felt to be having symptomatic bradycardia.  Adding AV nodal blocking agents was not recommended.  Refered to EP for evaluation of PPM placement.  Dr. 09/03/2020 does not feel he is a good candidate for PPM due to comorbidities. Continue apixaban, Heart healthy low-sodium diet-salty 6 given Increase physical activity as tolerated  CKD stage III-creatinine 1.39 on 07/06/2020. Follows with PCP  Hyperlipidemia-on atorvastatin Follows with PCP  Disposition: Follow-up with Dr. 09/03/2020 in 3 months.   Antoine Poche. Tyshea Imel NP-C    07/24/2020, 2:13 PM [facility] [address] Office [phone] Fax [phone]  Notice: This dictation was prepared with Dragon dictation along with smaller phrase technology. Any transcriptional errors that result from this process are unintentional and may not be corrected upon review.  I spent 15 minutes examining this patient, reviewing medications, and using patient centered shared decision  making involving her cardiac care.  Prior to her visit I spent greater than 20 minutes reviewing her past medical history,  medications, and prior cardiac tests.

## 2020-07-23 NOTE — Patient Instructions (Signed)
Medication Instructions:  Your physician recommends that you continue on your current medications as directed. Please refer to the Current Medication list given to you today.  Labwork: None ordered.  Testing/Procedures: None ordered.  Follow-Up: Your physician wants you to follow-up in: As needed with  James Allred, MD    Any Other Special Instructions Will Be Listed Below (If Applicable).  If you need a refill on your cardiac medications before your next appointment, please call your pharmacy.        

## 2020-07-23 NOTE — Progress Notes (Signed)
PCP: Kermit Balo, DO Primary Cardiologist: Dr Antoine Poche Primary EP: Dr Johney Frame  James Greer is a 87 y.o. male who presents today for electrophysiology consultation as requested by Dr Antoine Poche for bradycardia.  The patient has advanced dementia.  He is accompanied by caregiver and daughter in law today. He is reasonably active.  He has had several falls.  He has SOB with moderate activity.  He was recenlty seen by Dr Antoine Poche and noted to be quite bradycardic.  He had presyncope in the office.   Past Medical History:  Diagnosis Date  . Carpal tunnel syndrome   . Chronic atrial fibrillation, unspecified (HCC)   . Chronic kidney disease, stage 3a (HCC)   . Degenerative disease of nervous system (HCC)   . Hyperlipidemia   . Localized edema   . Other thrombophilia (HCC)   . Rhinitis, allergic   . Toe fracture, left 05/27/2020   fracture of the proximal phalanx of the left first toe  . Trigger finger   . Type 2 diabetes mellitus (HCC)   . Unspecified convulsions (HCC)   . Unspecified dementia with behavioral disturbance (HCC)   . Urolithiasis    Past Surgical History:  Procedure Laterality Date  . CRANIOTOMY     with evacuation of subdural hematoma  . EXTRACORPOREAL SHOCK WAVE LITHOTRIPSY      ROS- all systems are reviewed and negatives except as per HPI above  Current Outpatient Medications  Medication Sig Dispense Refill  . acetaminophen (TYLENOL) 500 MG tablet Take 650 mg by mouth in the morning, at noon, in the evening, and at bedtime.    Marland Kitchen apixaban (ELIQUIS) 5 MG TABS tablet Take 1 tablet (5 mg total) by mouth 2 (two) times daily. 60 tablet 3  . atorvastatin (LIPITOR) 40 MG tablet Take 40 mg by mouth daily.    . furosemide (LASIX) 40 MG tablet Take 1 tablet (40 mg total) by mouth 2 (two) times daily. 60 tablet 0  . Glucerna (GLUCERNA) LIQD Take 237 mLs by mouth 2 (two) times daily between meals.    . hydroxypropyl methylcellulose / hypromellose (ISOPTO TEARS /  GONIOVISC) 2.5 % ophthalmic solution Place 1 drop into both eyes 3 (three) times daily as needed for dry eyes.    . Multiple Vitamins-Minerals (MULTI-DAY PLUS MINERALS PO) Take 18 mg by mouth. Iron-400 mcg-25 mcg; amt: 1 tablet; oral. Once a morning    . OVER THE COUNTER MEDICATION Take by mouth See admin instructions. 2 fiber thins with 8 ounces of water,juice or hot tea every morning.    Marland Kitchen POTASSIUM CHLORIDE PO Take 10 mEq by mouth.    . QUEtiapine (SEROQUEL) 50 MG tablet Take 1.5 tablets (75 mg total) by mouth at bedtime. Taking 1 & 1/2 tablets= 75 mg 15 tablet 0  . sodium fluoride (PREVIDENT 5000 PLUS) 1.1 % CREA dental cream Place 1 application onto teeth every evening.    . traZODone (DESYREL) 50 MG tablet Take 1 tablet (50 mg total) by mouth at bedtime. For sleep 6 tablet 0   No current facility-administered medications for this visit.    Physical Exam: Vitals:   07/23/20 1200  BP: (!) 158/80  Pulse: (!) 110  SpO2: 99%  Weight: 186 lb (84.4 kg)  Height: 6\' 2"  (1.88 m)    GEN- The patient is elderly appearing, alert but confused Head- normocephalic, atraumatic Eyes-  Sclera clear, conjunctiva pink Ears- hearing intact Oropharynx- clear Lungs- normal work of breathing Heart- tachycardic irregular rhythm  GI- soft  Extremities- no clubbing, cyanosis, or edema  echo 05/22/20- severe LVH, EF 65%, mild biatrial enlargement  Wt Readings from Last 3 Encounters:  07/23/20 186 lb (84.4 kg)  07/17/20 188 lb 6.4 oz (85.5 kg)  07/06/20 183 lb (83 kg)    EKG tracing ordered today is personally reviewed and shows afib with V rates 110, RBBB  Assessment and Plan:  1. Tachycardia/ bradycardia syndrome The patient has had frequent bradycardia with presyncope.  He has had falls in the past concerning for syncope.   The patient has symptomatic bradycardia.  I discussed potential leadless pacing with the patients care givers today.   Risks, benefits, alternatives to pacemaker  implantation were discussed in detail with the patient today. The patient understands that the risks include but are not limited to bleeding, infection, pneumothorax, perforation, tamponade, vascular damage, renal failure, MI, stroke, death,  and lead dislodgement and wishes to think about this further.  They are very concerned about his ability to lay flat for 4 hours after implant and to comply with activity restrictions after the procedure.  Given advanced dementia, a more conservative approach may be best.  They will contact my office if they decide to proceed with PPM.  2. Permanent afib Rate controlled typically Ventricular rates are mildly elevated today He is unaware He is on eliquis for stroke prevention  3. HTN Stable No change required today   4. Chronic diastolic dysfunction Stable No change required today   Risks, benefits and potential toxicities for medications prescribed and/or refilled reviewed with patient today.   Follow-up with Dr Antoine Poche as scheduled I am happy to see as needed  Hillis Range MD, [facility] 07/23/2020 3:10 PM

## 2020-07-24 ENCOUNTER — Ambulatory Visit (INDEPENDENT_AMBULATORY_CARE_PROVIDER_SITE_OTHER): Payer: Medicare HMO | Admitting: General Practice

## 2020-07-24 ENCOUNTER — Encounter: Payer: Self-pay | Admitting: General Practice

## 2020-07-24 VITALS — BP 120/76 | HR 94 | Ht 74.0 in | Wt 187.0 lb

## 2020-07-24 DIAGNOSIS — I10 Essential (primary) hypertension: Secondary | ICD-10-CM | POA: Diagnosis not present

## 2020-07-24 DIAGNOSIS — I4821 Permanent atrial fibrillation: Secondary | ICD-10-CM

## 2020-07-24 DIAGNOSIS — N1831 Chronic kidney disease, stage 3a: Secondary | ICD-10-CM | POA: Diagnosis not present

## 2020-07-24 DIAGNOSIS — I5032 Chronic diastolic (congestive) heart failure: Secondary | ICD-10-CM | POA: Diagnosis not present

## 2020-07-24 DIAGNOSIS — E785 Hyperlipidemia, unspecified: Secondary | ICD-10-CM

## 2020-07-24 NOTE — Patient Instructions (Signed)
Medication Instructions:  The current medical regimen is effective;  continue present plan and medications as directed. Please refer to the Current Medication list given to you today.  *If you need a refill on your cardiac medications before your next appointment, please call your pharmacy*  Lab Work:   Testing/Procedures:  NONE    NONE  Follow-Up: Your next appointment:  3 month(s) In Person with Rollene Rotunda, MD OR IF UNAVAILABLE JESSE CLEAVER, FNP-C  At [facility], you and your health needs are our priority.  As part of our continuing mission to provide you with exceptional heart care, we have created designated Provider Care Teams.  These Care Teams include your primary Cardiologist (physician) and Advanced Practice Providers (APPs -  Physician Assistants and Nurse Practitioners) who all work together to provide you with the care you need, when you need it.  We recommend signing up for the patient portal called "MyChart".  Sign up information is provided on this After Visit Summary.  MyChart is used to connect with patients for Virtual Visits (Telemedicine).  Patients are able to view lab/test results, encounter notes, upcoming appointments, etc.  Non-urgent messages can be sent to your provider as well.   To learn more about what you can do with MyChart, go to [email or web address].

## 2020-08-01 ENCOUNTER — Institutional Professional Consult (permissible substitution): Payer: Medicare HMO | Admitting: Internal Medicine

## 2020-08-10 ENCOUNTER — Non-Acute Institutional Stay (SKILLED_NURSING_FACILITY): Payer: Medicare HMO | Admitting: Adult Health

## 2020-08-10 ENCOUNTER — Encounter: Payer: Self-pay | Admitting: Adult Health

## 2020-08-10 DIAGNOSIS — E1169 Type 2 diabetes mellitus with other specified complication: Secondary | ICD-10-CM

## 2020-08-10 DIAGNOSIS — G301 Alzheimer's disease with late onset: Secondary | ICD-10-CM | POA: Diagnosis not present

## 2020-08-10 DIAGNOSIS — E785 Hyperlipidemia, unspecified: Secondary | ICD-10-CM

## 2020-08-10 DIAGNOSIS — E1122 Type 2 diabetes mellitus with diabetic chronic kidney disease: Secondary | ICD-10-CM

## 2020-08-10 DIAGNOSIS — F0281 Dementia in other diseases classified elsewhere with behavioral disturbance: Secondary | ICD-10-CM

## 2020-08-10 DIAGNOSIS — I4821 Permanent atrial fibrillation: Secondary | ICD-10-CM | POA: Diagnosis not present

## 2020-08-10 DIAGNOSIS — F02818 Dementia in other diseases classified elsewhere, unspecified severity, with other behavioral disturbance: Secondary | ICD-10-CM

## 2020-08-10 DIAGNOSIS — I5032 Chronic diastolic (congestive) heart failure: Secondary | ICD-10-CM

## 2020-08-10 DIAGNOSIS — N1831 Chronic kidney disease, stage 3a: Secondary | ICD-10-CM

## 2020-08-10 NOTE — Progress Notes (Signed)
Location:  [facility] Room Number: 315-A Place of Service:  SNF [phone]) Provider:  Fletcher Anon, NP  Patient Care Team: Kermit Balo, DO as PCP - General (Geriatric Medicine) Rollene Rotunda, MD as PCP - Cardiology (Cardiology) Community, [facility] (Skilled Nursing Facility) Kermit Balo, DO (Geriatric Medicine)  Extended Emergency Contact Information Primary Emergency Contact: [name] Address: [address]          [place], [place] [address] Darden Amber of [place] Home Phone: [phone] Relation: Relative Secondary Emergency Contact: [name] Address: [address]          [place], [place] [address] Darden Amber of [place] Home Phone: [phone] Mobile Phone: [phone] Relation: Son  Code Status:  DNR  Goals of care: Advanced Directive information Advanced Directives 08/10/2020  Does Patient Have a Medical Advance Directive? Yes  Type of Estate agent of [place];Living will;Out of facility DNR (pink MOST or yellow form)  Does patient want to make changes to medical advance directive? No - Patient declined  Copy of Healthcare Power of Attorney in Chart? Yes - validated most recent copy scanned in chart (See row information)  Would patient like information on creating a medical advance directive? -  Pre-existing out of facility DNR order (yellow form or pink MOST form) Yellow form placed in chart (order not valid for inpatient use)     Chief Complaint  Patient presents with  . Medical Management of Chronic Issues    Routine Visit     HPI:  Pt is a 86 y.o. male seen today for medical management of chronic diseases.    Nurse reports that Mr. Weida weight has trended downward after getting prn lasix. He is not having any issues with sob or worsening edema.  Wt Readings from Last 3 Encounters:  08/10/20 179 lb 14.4 oz (81.6 kg)  07/24/20 187 lb (84.8 kg)  07/23/20 186 lb  (84.4 kg)   HR is running slightly high at times 80-115, asymptomatic as far as we can tell. 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 His nurse reports that his family declined a pacemaker due to his dementia.    His caregiver reports he is doing well in terms of behavior. He has occasional periods of agitation but no physical aggression. Delusional at times.   Past Medical History:  Diagnosis Date  . Carpal tunnel syndrome   . Chronic atrial fibrillation, unspecified (HCC)   . Chronic kidney disease, stage 3a (HCC)   . Degenerative disease of nervous system (HCC)   . Hyperlipidemia   . Localized edema   . Other thrombophilia (HCC)   . Rhinitis, allergic   . Toe fracture, left 05/27/2020   fracture of the proximal phalanx of the left first toe  . Trigger finger   . Type 2 diabetes mellitus (HCC)   . Unspecified convulsions (HCC)   . Unspecified dementia with behavioral disturbance (HCC)   . Urolithiasis    Past Surgical History:  Procedure Laterality Date  . CRANIOTOMY     with evacuation of subdural hematoma  . EXTRACORPOREAL SHOCK WAVE LITHOTRIPSY      No Known Allergies  Outpatient Encounter Medications as of 08/10/2020  Medication Sig  . acetaminophen (TYLENOL) 500 MG tablet Take 650 mg by mouth in the morning, at noon, in the evening, and at bedtime.  Marland Kitchen apixaban (ELIQUIS) 5 MG TABS tablet Take 1 tablet (  5 mg total) by mouth 2 (two) times daily.  Marland Kitchen atorvastatin (LIPITOR) 40 MG tablet Take 40 mg by mouth daily.  . furosemide (LASIX) 20 MG tablet Take 40 mg by mouth 2 (two) times daily.  . furosemide (LASIX) 40 MG tablet Take 40 mg by mouth as directed. If weight up 2-3 pounds in 24 hours or 5 pounds in 2 days  . Glucerna (GLUCERNA) LIQD Take 237 mLs by mouth 2 (two) times daily between meals.  . hydroxypropyl methylcellulose / hypromellose (ISOPTO TEARS / GONIOVISC) 2.5 % ophthalmic solution Place 1 drop into both eyes 3 (three)  times daily as needed for dry eyes.  . Multiple Vitamins-Minerals (MULTI-DAY PLUS MINERALS PO) Take 18 mg by mouth. Iron-400 mcg-25 mcg; amt: 1 tablet; oral. Once a morning  . OVER THE COUNTER MEDICATION Take by mouth See admin instructions. 2 fiber thins with 8 ounces of water,juice or hot tea every morning.  . OXYGEN Inhale 2 L into the lungs as directed. may titrate to 3L as needed to maintain oxygen saturation above 90%  Every Shift; Shift 1, Shift 2, Shift 3  . potassium chloride (KLOR-CON) 10 MEQ tablet Take 20 mEq by mouth daily. Take between 8-11 am  . QUEtiapine (SEROQUEL) 50 MG tablet Take 75 mg by mouth at bedtime.  . sodium fluoride (PREVIDENT 5000 PLUS) 1.1 % CREA dental cream Place 1 application onto teeth every evening.  . traZODone (DESYREL) 50 MG tablet Take 1 tablet (50 mg total) by mouth at bedtime. For sleep  . [DISCONTINUED] furosemide (LASIX) 40 MG tablet Take 1 tablet (40 mg total) by mouth 2 (two) times daily.  . [DISCONTINUED] POTASSIUM CHLORIDE PO Take 10 mEq by mouth.  . [DISCONTINUED] QUEtiapine (SEROQUEL) 50 MG tablet Take 1.5 tablets (75 mg total) by mouth at bedtime. Taking 1 & 1/2 tablets= 75 mg (Patient taking differently: Take 75 mg by mouth at bedtime.)   No facility-administered encounter medications on file as of 08/10/2020.    Review of Systems  Unable to perform ROS: Dementia    Immunization History  Administered Date(s) Administered  . Influenza, High Dose Seasonal PF 03/26/2020  . Influenza-Unspecified 03/11/2011, 03/08/2012, 03/02/2013, 08/09/2013, 02/27/2016, 03/02/2017, 01/01/2019, 03/26/2020  . Moderna Sars-Covid-2 Vaccination 07/05/2019, 08/12/2019, 02/04/2020  . Pneumococcal Conjugate-13 06/13/2009  . Zoster 04/02/2010  . Zoster Recombinat (Shingrix) 05/03/2020   Pertinent  Health Maintenance Due  Topic Date Due  . FOOT EXAM  Never done  . URINE MICROALBUMIN  Never done  . PNA vac Low Risk Adult (2 of 2 - PPSV23) 06/13/2010  . HEMOGLOBIN  A1C  07/19/2020  . OPHTHALMOLOGY EXAM  04/05/2021  . INFLUENZA VACCINE  Completed   Fall Risk  03/17/2020 03/14/2020  Falls in the past year? 1 1  Number falls in past yr: 1 0  Injury with Fall? 1 1  Risk for fall due to : History of fall(s);Impaired balance/gait;Impaired mobility;Mental status change;Medication side effect -  Follow up Falls evaluation completed;Education provided;Falls prevention discussed;Follow up appointment -   Functional Status Survey:    Vitals:   08/10/20 1117  BP: (!) 156/60  Pulse: 82  Resp: 18  Temp: (!) 97.3 F (36.3 C)  SpO2: 96%  Weight: 185 lb 9.6 oz (84.2 kg)  Height: 6\' 1"  (1.854 m)   Body mass index is 24.49 kg/m. Physical Exam Vitals and nursing note reviewed.  Constitutional:      General: He is not in acute distress.    Appearance: He is not  diaphoretic.  HENT:     Head: Normocephalic and atraumatic.  Neck:     Thyroid: No thyromegaly.     Vascular: No JVD.     Trachea: No tracheal deviation.     Comments: jvd noted  Cardiovascular:     Rate and Rhythm: Normal rate. Rhythm irregular.     Heart sounds: No murmur heard.   Pulmonary:     Effort: Pulmonary effort is normal. No respiratory distress.     Breath sounds: Normal breath sounds. No wheezing.  Abdominal:     General: Bowel sounds are normal. There is no distension.     Palpations: Abdomen is soft.     Tenderness: There is no abdominal tenderness.  Musculoskeletal:     Comments: BLE edema +1-2  Lymphadenopathy:     Cervical: No cervical adenopathy.  Skin:    General: Skin is warm and dry.  Neurological:     General: No focal deficit present.     Mental Status: He is alert. Mental status is at baseline.  Psychiatric:        Mood and Affect: Mood normal.     Labs reviewed: Recent Labs    06/26/20 0956 06/27/20 0550 07/06/20 1304  NA 145 147* 143  K 3.0* 3.6 4.5  CL 101 105 100  CO2 31 30 30*  GLUCOSE 115* 105* 71  BUN 40* 37* 30*  CREATININE 1.50*  1.29* 1.39*  CALCIUM 9.1 9.0 9.2  MG 2.3 2.2  --   PHOS 4.1 4.0  --    Recent Labs    01/23/20 0000 06/24/20 1611 06/26/20 0956 06/27/20 0550  AST 27 42*  --   --   ALT 23 35  --   --   ALKPHOS  --  77  --   --   BILITOT  --  2.0*  --   --   PROT  --  7.7  --   --   ALBUMIN  --  4.0 3.8 3.3*   Recent Labs    01/17/20 0000 06/24/20 1611  WBC  --  5.2  NEUTROABS  --  3.1  HGB 13.1* 14.0  HCT 40* 43.3  MCV  --  95.8  PLT  --  203   Lab Results  Component Value Date   TSH 2.36 01/17/2020   Lab Results  Component Value Date   HGBA1C 6.7 01/17/2020   No results found for: CHOL, HDL, LDLCALC, LDLDIRECT, TRIG, CHOLHDL  Significant Diagnostic Results in last 30 days:  No results found.  Assessment/Plan  1. Type 2 diabetes mellitus with stage 3a chronic kidney disease, without long-term current use of insulin (HCC) Lab Results  Component Value Date   HGBA1C 6.7 01/17/2020  Diet controlled Goal <8% due to age/dementia  2. Hyperlipidemia associated with type 2 diabetes mellitus (HCC) Will check lipids with next lab draw Continue Lipitor 40 mg qd   3. Permanent atrial fibrillation (HCC) Rate slightly above goal but due to his prior hx of bradycardia this may be favorable  Continues on Eliquis ofr CVA risk reduction without s/e Followed by cardiology, f/u as indicated.   4. Late onset Alzheimer's dementia with behavioral disturbance (HCC) Moderate stage Doing well in the memory care unit   5. Stage 3a chronic kidney disease (HCC) Continue to periodically monitor BMP and avoid nephrotoxic agents  6. Chronic diastolic CHF (congestive heart failure) (HCC) Improved weight Continue Lasix 40 mg bid and as needed Daily weights Compression hose Low  sodium diet if desired    Family/ staff Communication: resident  Labs/tests ordered:  NA

## 2020-08-27 ENCOUNTER — Telehealth: Payer: Self-pay | Admitting: Cardiology

## 2020-08-27 MED ORDER — TORSEMIDE 60 MG PO TABS: 60.0000 mg | ORAL_TABLET | Freq: Two times a day (BID) | ORAL | 0 refills | Status: DC

## 2020-08-27 MED ORDER — TORSEMIDE 60 MG PO TABS: 20.0000 mg | ORAL_TABLET | Freq: Two times a day (BID) | ORAL | 0 refills | Status: DC

## 2020-08-27 NOTE — Telephone Encounter (Signed)
I gave the orders to [facility] at [facility] and she verbalized understanding and read the orders back to me.

## 2020-08-27 NOTE — Telephone Encounter (Signed)
I would suggest that we try stopping the Lasix and going to Torsemide 60 mg bid but that we would need to keep a very close eye on his creat.  I would ask that the primary MD at [facility] be in the loop in this suggestion and that we get labs drawn on Thursday with results to me (BMET).   Thanks

## 2020-08-27 NOTE — Telephone Encounter (Signed)
Danielle @ [facility] [phone] called to report that the pt has been taking his added lasix consistently for several days.Marland Kitchen lasix 80 mg bid.   He has been having consistent but not worsening bilateral lower extremity edema... just to his ankles. He wears compression stockings during the day. The edema is not improving with the added lasix.   Pt has been showing signs of increased dyspnea with minimal exertion such as walking down the hall to the dining room which is about a block.   He is not having difficulty sleeping flat at night.   Bp staying 120-130/ 60's HR has been on and off high but may be with exertion at 99-108.   Pt baseline weight 180 lbs but after several days he has been gaining about 2 lbs daily and now up to 192.4 lbs.   No cough.   Will forward to Dr. Antoine Poche for review... next OV 10/23/20.

## 2020-08-27 NOTE — Telephone Encounter (Signed)
  Danielle from [facility] states patient has order for PRN lasix for 40 mg 2 x daily and permission from Dr Antoine Poche to double dosage when having more swelling. She says patient is usually about 180 lbs but is 192.4 lbs today. She would like to know what they should do medication wise and what would Dr Antoine Poche like to be the patient's target weight? Patient is also having some dyspnea on exertion and increased bilateral edema in legs. Please advise.

## 2020-08-30 LAB — BASIC METABOLIC PANEL
BUN: 37 — AB (ref 4–21)
CO2: 30 — AB (ref 13–22)
Chloride: 99 (ref 99–108)
Creatinine: 1.4 — AB (ref 0.6–1.3)
Glucose: 105
Potassium: 4.1 (ref 3.4–5.3)
Sodium: 143 (ref 137–147)

## 2020-08-30 LAB — COMPREHENSIVE METABOLIC PANEL: Calcium: 9.7 (ref 8.7–10.7)

## 2020-09-03 ENCOUNTER — Encounter: Payer: Self-pay | Admitting: *Deleted

## 2020-09-05 ENCOUNTER — Non-Acute Institutional Stay (SKILLED_NURSING_FACILITY): Payer: Medicare HMO | Admitting: Internal Medicine

## 2020-09-05 ENCOUNTER — Encounter: Payer: Self-pay | Admitting: Internal Medicine

## 2020-09-05 ENCOUNTER — Telehealth: Payer: Self-pay | Admitting: Cardiology

## 2020-09-05 DIAGNOSIS — R42 Dizziness and giddiness: Secondary | ICD-10-CM | POA: Diagnosis not present

## 2020-09-05 DIAGNOSIS — R9431 Abnormal electrocardiogram [ECG] [EKG]: Secondary | ICD-10-CM

## 2020-09-05 DIAGNOSIS — I4821 Permanent atrial fibrillation: Secondary | ICD-10-CM

## 2020-09-05 DIAGNOSIS — F02818 Dementia in other diseases classified elsewhere, unspecified severity, with other behavioral disturbance: Secondary | ICD-10-CM

## 2020-09-05 DIAGNOSIS — N289 Disorder of kidney and ureter, unspecified: Secondary | ICD-10-CM | POA: Diagnosis not present

## 2020-09-05 DIAGNOSIS — F0281 Dementia in other diseases classified elsewhere with behavioral disturbance: Secondary | ICD-10-CM

## 2020-09-05 DIAGNOSIS — R001 Bradycardia, unspecified: Secondary | ICD-10-CM | POA: Diagnosis not present

## 2020-09-05 DIAGNOSIS — G301 Alzheimer's disease with late onset: Secondary | ICD-10-CM

## 2020-09-05 LAB — CBC AND DIFFERENTIAL
HCT: 40 — AB (ref 41–53)
Hemoglobin: 13.5 (ref 13.5–17.5)
Platelets: 156 (ref 150–399)
WBC: 5.6

## 2020-09-05 LAB — CBC: RBC: 4.28 (ref 3.87–5.11)

## 2020-09-05 NOTE — Progress Notes (Signed)
Location: Medical illustrator of Service:  SNF ([id number])  Provider:   Code Status:  Goals of Care:  Advanced Directives 08/10/2020  Does Patient Have a Medical Advance Directive? Yes  Type of Estate agent of [place];Living will;Out of facility DNR (pink MOST or yellow form)  Does patient want to make changes to medical advance directive? No - Patient declined  Copy of Healthcare Power of Attorney in Chart? Yes - validated most recent copy scanned in chart (See row information)  Would patient like information on creating a medical advance directive? -  Pre-existing out of facility DNR order (yellow form or pink MOST form) (No Data)     Chief Complaint  Patient presents with  . Acute Visit    HPI: Patient is a 87 y.o. male seen today for an acute visit for Dizziness and Bradycardia Patient has a history of type 2 diabetes, hyperlipidemia, CKD.  He also has history of A. fib on Eliquis.  But recently has been having episodes of bradycardia.  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 Today the nurses noticed patient was more unstable when he was walking.  He does have dysphagia and it is hard to know if he was having dizziness.  Also had bradycardia with heart rate in 30s. When I went to see patient he was alert responsive.  Was falling through the commands.  Cannot give much history because of his aphasia and dementia. Is taking p.o.  Appetite is poor in general. Family does not want him to go to emergency room unless absolutely needed.  His weight was down to 183 lbs Was  192 lbs when Demadex was started  Past Medical History:  Diagnosis Date  .  Carpal tunnel syndrome   . Chronic atrial fibrillation, unspecified (HCC)   . Chronic kidney disease, stage 3a (HCC)   . Degenerative disease of nervous system (HCC)   . Hyperlipidemia   . Localized edema   . Other thrombophilia (HCC)   . Rhinitis, allergic   . Toe fracture, left 05/27/2020   fracture of the proximal phalanx of the left first toe  . Trigger finger   . Type 2 diabetes mellitus (HCC)   . Unspecified convulsions (HCC)   . Unspecified dementia with behavioral disturbance (HCC)   . Urolithiasis     Past Surgical History:  Procedure Laterality Date  . CRANIOTOMY     with evacuation of subdural hematoma  . EXTRACORPOREAL SHOCK WAVE LITHOTRIPSY      No Known Allergies  Outpatient Encounter Medications as of 09/05/2020  Medication Sig  . acetaminophen (TYLENOL) 500 MG tablet Take 650 mg by mouth in the morning, at noon, in the evening, and at bedtime.  Marland Kitchen apixaban (ELIQUIS) 5 MG TABS tablet Take 1 tablet (5 mg total) by mouth 2 (two) times daily.  Marland Kitchen atorvastatin (LIPITOR) 40 MG tablet Take 40 mg by mouth daily.  . Glucerna (GLUCERNA) LIQD Take 237 mLs by mouth 2 (two) times daily between meals.  . hydroxypropyl methylcellulose / hypromellose (ISOPTO TEARS / GONIOVISC) 2.5 % ophthalmic solution Place 1 drop into both eyes 3 (three) times daily as needed for dry eyes.  . Multiple Vitamins-Minerals (MULTI-DAY PLUS MINERALS PO) Take  18 mg by mouth. Iron-400 mcg-25 mcg; amt: 1 tablet; oral. Once a morning  . OVER THE COUNTER MEDICATION Take by mouth See admin instructions. 2 fiber thins with 8 ounces of water,juice or hot tea every morning.  . OXYGEN Inhale 2 L into the lungs as directed. may titrate to 3L as needed to maintain oxygen saturation above 90%  Every Shift; Shift 1, Shift 2, Shift 3  . potassium chloride (KLOR-CON) 10 MEQ tablet Take 20 mEq by mouth daily. Take between 8-11 am  . QUEtiapine (SEROQUEL) 50 MG tablet Take 75 mg by mouth at bedtime.  . sodium fluoride  (PREVIDENT 5000 PLUS) 1.1 % CREA dental cream Place 1 application onto teeth every evening.  . Torsemide 60 MG TABS Take 60 mg by mouth 2 (two) times daily.  . traZODone (DESYREL) 50 MG tablet Take 1 tablet (50 mg total) by mouth at bedtime. For sleep   No facility-administered encounter medications on file as of 09/05/2020.    Review of Systems:  Review of Systems  Constitutional: Positive for activity change.  HENT: Negative.   Respiratory: Positive for shortness of breath.   Cardiovascular: Positive for leg swelling.  Gastrointestinal: Negative.   Genitourinary: Negative.   Musculoskeletal: Positive for gait problem.  Skin: Negative.   Neurological: Positive for dizziness and weakness.  Psychiatric/Behavioral: Positive for confusion. Negative for agitation.    Health Maintenance  Topic Date Due  . FOOT EXAM  Never done  . URINE MICROALBUMIN  Never done  . TETANUS/TDAP  Never done  . PNA vac Low Risk Adult (2 of 2 - PPSV23) 06/13/2010  . HEMOGLOBIN A1C  07/19/2020  . INFLUENZA VACCINE  12/31/2020  . OPHTHALMOLOGY EXAM  04/05/2021  . COVID-19 Vaccine  Completed  . HPV VACCINES  Aged Out    Physical Exam: There were no vitals filed for this visit. There is no height or weight on file to calculate BMI. Physical Exam Vitals reviewed.  Constitutional:      Comments: Has aphasia  HENT:     Head: Normocephalic.     Nose: Nose normal.     Mouth/Throat:     Mouth: Mucous membranes are dry.  Eyes:     Pupils: Pupils are equal, round, and reactive to light.  Cardiovascular:     Rate and Rhythm: Bradycardia present.     Pulses: Normal pulses.  Pulmonary:     Effort: Pulmonary effort is normal.     Breath sounds: Normal breath sounds. No wheezing or rales.  Abdominal:     General: Abdomen is flat. Bowel sounds are normal.     Palpations: Abdomen is soft.  Musculoskeletal:     Cervical back: Neck supple.     Comments: Mild Swelling  Skin:    General: Skin is warm.   Neurological:     General: No focal deficit present.     Mental Status: He is alert.  Psychiatric:        Mood and Affect: Mood normal.        Thought Content: Thought content normal.     Labs reviewed: Basic Metabolic Panel: Recent Labs    01/17/20 0000 01/23/20 0000 06/26/20 0956 06/27/20 0550 07/06/20 1304 08/30/20 0000  NA  --    < > 145 147* 143 143  K  --    < > 3.0* 3.6 4.5 4.1  CL  --    < > 101 105 100 99  CO2  --    < >  31 30 30* 30*  GLUCOSE  --    < > 115* 105* 71  --   BUN  --    < > 40* 37* 30* 37*  CREATININE  --    < > 1.50* 1.29* 1.39* 1.4*  CALCIUM  --    < > 9.1 9.0 9.2 9.7  MG  --   --  2.3 2.2  --   --   PHOS  --   --  4.1 4.0  --   --   TSH 2.36  --   --   --   --   --    < > = values in this interval not displayed.   Liver Function Tests: Recent Labs    01/23/20 0000 06/24/20 1611 06/26/20 0956 06/27/20 0550  AST 27 42*  --   --   ALT 23 35  --   --   ALKPHOS  --  77  --   --   BILITOT  --  2.0*  --   --   PROT  --  7.7  --   --   ALBUMIN  --  4.0 3.8 3.3*   No results for input(s): LIPASE, AMYLASE in the last 8760 hours. No results for input(s): AMMONIA in the last 8760 hours. CBC: Recent Labs    01/17/20 0000 06/24/20 1611  WBC  --  5.2  NEUTROABS  --  3.1  HGB 13.1* 14.0  HCT 40* 43.3  MCV  --  95.8  PLT  --  203   Lipid Panel: No results for input(s): CHOL, HDL, LDLCALC, TRIG, CHOLHDL, LDLDIRECT in the last 8760 hours. Lab Results  Component Value Date   HGBA1C 6.7 01/17/2020    Procedures since last visit: No results found.  Assessment/Plan Dizziness with Bradycardia with recent increase in Diuretics Ordered Stat Labs  Addendum  His BUN is 54 and Creat is 2.5 Last week BUN was 36 Creat of 1.44 Discontinue Demadex Encourage Po Hydration Repeat Labs on Fri  2. Bradycardia EKG done showed HR of 33 with Prolong QT interval of 679 AS Per cardiology no Pacemaker ? On high doses of Seroquel and Trazodone which  can cause Prolong QT interval Discontinue  Seroquel for now  3. Dizziness Most likely related to above Dehydration and Bradycardia  4. Prolonged QT interval Discontinue Seroquel for now   5. Late onset Alzheimer's dementia with behavioral disturbance (HCC) Does have   6. Permanent atrial fibrillation (HCC) On Eliquis Off metoprolol and Digoxin due to Bradycardia    Labs/tests ordered:  * No order type specified * Next appt:  Visit date not found Total time spent in this patient care encounter was  45_  minutes; greater than 50% of the visit spent counseling patient and staff, reviewing records , Labs and coordinating care for problems addressed at this encounter.

## 2020-09-05 NOTE — Telephone Encounter (Signed)
.  STAT if HR is under 50 or over 120 (normal HR is 60-100 beats per minute)  1) What is your heart rate? 36 this morning  2) Do you have a log of your heart rate readings (document readings)?yes  3) Do you have any other symptoms?  Dizziness and weight gain- gained 6 lbs in 2 days

## 2020-09-05 NOTE — Telephone Encounter (Incomplete)
Pt c/o BP issue: STAT if pt c/o blurred vision, one-sided weakness or slurred speech  1. What are your last 5 BP readings? 110/61 heart rate 39; 122/74  2. Are you having any other symptoms (ex. Dizziness, headache, blurred vision, passed out)? Dizziness, headache, fainting, has been able to stand with  3. What is your BP issue? Patient hasnt been able to get out of bed. Patient hasnt been able to stand one his own, hasn't been eating   STAT if HR is under 50 or over 120 (normal HR is 60-100 beats per minute)  1) What is your heart rate? 39; 43  2) Do you have a log of your heart rate readings (document readings)? ***  3) Do you have any other symptoms? patient hasnt been able to get out of bed. Patient hasnt been able to stand one his own, hasn't been eating

## 2020-09-05 NOTE — Telephone Encounter (Signed)
Patient daughter is being advised by Eligha Bridegroom, RN- see other encounter-

## 2020-09-05 NOTE — Telephone Encounter (Signed)
Spoke with patient's daughter-in-law who states patient has been very fatigued, not wanting to get out of the bed, and having lower HR readings < 40 bpm over the past 2 days. She states he felt well over the weekend, walked almost 2 miles and was eating well, then on Monday morning he almost fell walking back from the dining room. Recent BP readings:  110//61, 122/74, HR 43, Pt has hx atrial fib, daughter-in-law is not sure how they are checking his HR because he lives in the Memory Care unit at [facility]. Daughter-in-law and son live in [place]. She last saw the pt on Friday and her husband saw him on Sunday. She is requesting an appointment with  Dr. Antoine Poche and reports pt has a private caregiver that could bring him to the office at any time. I advised that patient should go to ER with symptoms of syncope but daughter-in-law states pt has dementia and gets very confused at the hospital, is requesting evaluation by Dr. Antoine Poche. She has placed a call to the head nurse at [facility] for more information. I advised that I will forward message to Dr. Antoine Poche and his primary nurse for review and that someone from our office will call her back with his advice.

## 2020-09-05 NOTE — Telephone Encounter (Signed)
Spoke to nurse at [facility]- who states patients HR is 36- it stays low, but it has since dropped even lower causing him to be dizzy and nurse is concerned for falls. She states he has also gained 6lbs in 2 days, in review of recent calls- Dr.Hochrein suggested Torsemide 60 twice daily and to have blood work last week. It was checked but not sent to [place], she will fax over now- but she did give me numbers as below to make him aware and when I receive fax I will send to him to review.   Creat- 1.44 BUN- 36.8  BUN RATIO- 25.6  Nurse just would like to know what else she can do and how best to assist him with his low heart rate and swelling.   I advised I would send a message over and would call back with recommendations.

## 2020-09-05 NOTE — Telephone Encounter (Signed)
Patient's daughter-in-law called and wanted patient to be seen immediately due to low heart rate in the 30's. To her that she should take patient to ER or call 911, especially if he is close to syncope. Please call back

## 2020-09-05 NOTE — Telephone Encounter (Signed)
I so not see the BMET.  I need to see this before suggesting a change in diuretic.  He is not a candidate for a pacemaker.  No therapy for low heart rate.

## 2020-09-06 ENCOUNTER — Encounter (HOSPITAL_COMMUNITY): Payer: Self-pay | Admitting: Emergency Medicine

## 2020-09-06 ENCOUNTER — Emergency Department (HOSPITAL_COMMUNITY): Payer: Medicare HMO

## 2020-09-06 ENCOUNTER — Inpatient Hospital Stay (HOSPITAL_COMMUNITY): Payer: Medicare HMO

## 2020-09-06 ENCOUNTER — Encounter: Payer: Self-pay | Admitting: Internal Medicine

## 2020-09-06 ENCOUNTER — Other Ambulatory Visit: Payer: Self-pay

## 2020-09-06 ENCOUNTER — Inpatient Hospital Stay (HOSPITAL_COMMUNITY)
Admission: EM | Admit: 2020-09-06 | Discharge: 2020-09-09 | DRG: 682 | Disposition: A | Discharge status: Skilled Nursing Facility | Payer: Medicare HMO | Attending: Internal Medicine | Admitting: Internal Medicine

## 2020-09-06 DIAGNOSIS — F0391 Unspecified dementia with behavioral disturbance: Secondary | ICD-10-CM | POA: Diagnosis present

## 2020-09-06 DIAGNOSIS — N179 Acute kidney failure, unspecified: Secondary | ICD-10-CM | POA: Diagnosis present

## 2020-09-06 DIAGNOSIS — I361 Nonrheumatic tricuspid (valve) insufficiency: Secondary | ICD-10-CM

## 2020-09-06 DIAGNOSIS — I4821 Permanent atrial fibrillation: Secondary | ICD-10-CM | POA: Diagnosis present

## 2020-09-06 DIAGNOSIS — Z66 Do not resuscitate: Secondary | ICD-10-CM | POA: Diagnosis present

## 2020-09-06 DIAGNOSIS — E1169 Type 2 diabetes mellitus with other specified complication: Secondary | ICD-10-CM | POA: Diagnosis not present

## 2020-09-06 DIAGNOSIS — E785 Hyperlipidemia, unspecified: Secondary | ICD-10-CM | POA: Diagnosis present

## 2020-09-06 DIAGNOSIS — E1165 Type 2 diabetes mellitus with hyperglycemia: Secondary | ICD-10-CM | POA: Diagnosis not present

## 2020-09-06 DIAGNOSIS — T502X5A Adverse effect of carbonic-anhydrase inhibitors, benzothiadiazides and other diuretics, initial encounter: Secondary | ICD-10-CM | POA: Diagnosis present

## 2020-09-06 DIAGNOSIS — R001 Bradycardia, unspecified: Secondary | ICD-10-CM | POA: Diagnosis present

## 2020-09-06 DIAGNOSIS — R0600 Dyspnea, unspecified: Secondary | ICD-10-CM

## 2020-09-06 DIAGNOSIS — R188 Other ascites: Secondary | ICD-10-CM | POA: Diagnosis present

## 2020-09-06 DIAGNOSIS — R9431 Abnormal electrocardiogram [ECG] [EKG]: Secondary | ICD-10-CM | POA: Diagnosis not present

## 2020-09-06 DIAGNOSIS — R0609 Other forms of dyspnea: Secondary | ICD-10-CM

## 2020-09-06 DIAGNOSIS — K7011 Alcoholic hepatitis with ascites: Secondary | ICD-10-CM

## 2020-09-06 DIAGNOSIS — I130 Hypertensive heart and chronic kidney disease with heart failure and stage 1 through stage 4 chronic kidney disease, or unspecified chronic kidney disease: Secondary | ICD-10-CM | POA: Diagnosis present

## 2020-09-06 DIAGNOSIS — N1831 Chronic kidney disease, stage 3a: Secondary | ICD-10-CM | POA: Diagnosis present

## 2020-09-06 DIAGNOSIS — K7460 Unspecified cirrhosis of liver: Secondary | ICD-10-CM | POA: Diagnosis present

## 2020-09-06 DIAGNOSIS — E1122 Type 2 diabetes mellitus with diabetic chronic kidney disease: Secondary | ICD-10-CM | POA: Diagnosis present

## 2020-09-06 DIAGNOSIS — E860 Dehydration: Secondary | ICD-10-CM | POA: Diagnosis present

## 2020-09-06 DIAGNOSIS — I5033 Acute on chronic diastolic (congestive) heart failure: Secondary | ICD-10-CM | POA: Diagnosis present

## 2020-09-06 DIAGNOSIS — Z7901 Long term (current) use of anticoagulants: Secondary | ICD-10-CM | POA: Diagnosis not present

## 2020-09-06 DIAGNOSIS — K766 Portal hypertension: Secondary | ICD-10-CM | POA: Diagnosis present

## 2020-09-06 DIAGNOSIS — N183 Chronic kidney disease, stage 3 unspecified: Secondary | ICD-10-CM | POA: Diagnosis present

## 2020-09-06 DIAGNOSIS — Z79899 Other long term (current) drug therapy: Secondary | ICD-10-CM

## 2020-09-06 DIAGNOSIS — Z20822 Contact with and (suspected) exposure to covid-19: Secondary | ICD-10-CM | POA: Diagnosis present

## 2020-09-06 DIAGNOSIS — R7989 Other specified abnormal findings of blood chemistry: Secondary | ICD-10-CM

## 2020-09-06 DIAGNOSIS — R945 Abnormal results of liver function studies: Secondary | ICD-10-CM

## 2020-09-06 HISTORY — DX: Abnormal electrocardiogram (ECG) (EKG): R94.31

## 2020-09-06 LAB — URINALYSIS, ROUTINE W REFLEX MICROSCOPIC
Bacteria, UA: NONE SEEN
Bilirubin Urine: NEGATIVE
Glucose, UA: NEGATIVE mg/dL
Ketones, ur: NEGATIVE mg/dL
Nitrite: NEGATIVE
Protein, ur: 30 mg/dL — AB
Specific Gravity, Urine: 1.014 (ref 1.005–1.030)
pH: 5 (ref 5.0–8.0)

## 2020-09-06 LAB — COMPREHENSIVE METABOLIC PANEL
ALT: 50 U/L — ABNORMAL HIGH (ref 0–44)
AST: 52 U/L — ABNORMAL HIGH (ref 15–41)
Albumin: 3.7 g/dL (ref 3.5–5.0)
Alkaline Phosphatase: 138 U/L — ABNORMAL HIGH (ref 38–126)
Anion gap: 14 (ref 5–15)
BUN: 64 mg/dL — ABNORMAL HIGH (ref 8–23)
CO2: 25 mmol/L (ref 22–32)
Calcium: 9.4 mg/dL (ref 8.9–10.3)
Chloride: 98 mmol/L (ref 98–111)
Creatinine, Ser: 2.57 mg/dL — ABNORMAL HIGH (ref 0.61–1.24)
GFR, Estimated: 24 mL/min — ABNORMAL LOW (ref >60)
Glucose, Bld: 116 mg/dL — ABNORMAL HIGH (ref 70–99)
Potassium: 4.8 mmol/L (ref 3.5–5.1)
Sodium: 137 mmol/L (ref 135–145)
Total Bilirubin: 1.4 mg/dL — ABNORMAL HIGH (ref 0.3–1.2)
Total Protein: 7.3 g/dL (ref 6.5–8.1)

## 2020-09-06 LAB — CBC WITH DIFFERENTIAL/PLATELET
Abs Immature Granulocytes: 0.04 10*3/uL (ref 0.00–0.07)
Basophils Absolute: 0.1 10*3/uL (ref 0.0–0.1)
Basophils Relative: 1 %
Eosinophils Absolute: 0.1 10*3/uL (ref 0.0–0.5)
Eosinophils Relative: 1 %
HCT: 39.4 % (ref 39.0–52.0)
Hemoglobin: 12.9 g/dL — ABNORMAL LOW (ref 13.0–17.0)
Immature Granulocytes: 1 %
Lymphocytes Relative: 16 %
Lymphs Abs: 1.2 10*3/uL (ref 0.7–4.0)
MCH: 31.1 pg (ref 26.0–34.0)
MCHC: 32.7 g/dL (ref 30.0–36.0)
MCV: 94.9 fL (ref 80.0–100.0)
Monocytes Absolute: 0.9 10*3/uL (ref 0.1–1.0)
Monocytes Relative: 13 %
Neutro Abs: 5.2 10*3/uL (ref 1.7–7.7)
Neutrophils Relative %: 68 %
Platelets: 168 10*3/uL (ref 150–400)
RBC: 4.15 MIL/uL — ABNORMAL LOW (ref 4.22–5.81)
RDW: 17.6 % — ABNORMAL HIGH (ref 11.5–15.5)
WBC: 7.5 10*3/uL (ref 4.0–10.5)
nRBC: 0 % (ref 0.0–0.2)

## 2020-09-06 LAB — MAGNESIUM: Magnesium: 2.5 mg/dL — ABNORMAL HIGH (ref 1.7–2.4)

## 2020-09-06 LAB — ECHOCARDIOGRAM COMPLETE
Area-P 1/2: 5.66 cm2
S' Lateral: 2.6 cm

## 2020-09-06 LAB — BRAIN NATRIURETIC PEPTIDE: B Natriuretic Peptide: 383.6 pg/mL — ABNORMAL HIGH (ref 0.0–100.0)

## 2020-09-06 LAB — TROPONIN I (HIGH SENSITIVITY)
Troponin I (High Sensitivity): 141 ng/L (ref <18)
Troponin I (High Sensitivity): 138 ng/L (ref <18)

## 2020-09-06 LAB — RESP PANEL BY RT-PCR (FLU A&B, COVID) ARPGX2
Influenza A by PCR: NEGATIVE
Influenza B by PCR: NEGATIVE
SARS Coronavirus 2 by RT PCR: NEGATIVE

## 2020-09-06 MED ORDER — POLYETHYLENE GLYCOL 3350 17 G PO PACK: 17.0000 g | PACK | Freq: Every day | ORAL | Status: DC | PRN

## 2020-09-06 MED ORDER — ACETAMINOPHEN 325 MG PO TABS
650.0000 mg | ORAL_TABLET | Freq: Four times a day (QID) | ORAL | Status: DC | PRN
  Administered 2020-09-07 – 2020-09-08 (×2): 650 mg via ORAL
  Filled 2020-09-06 (×3): qty 2

## 2020-09-06 MED ORDER — APIXABAN 2.5 MG PO TABS
2.5000 mg | ORAL_TABLET | Freq: Two times a day (BID) | ORAL | Status: DC
  Administered 2020-09-06 – 2020-09-08 (×4): 2.5 mg via ORAL
  Filled 2020-09-06 (×5): qty 1

## 2020-09-06 MED ORDER — ACETAMINOPHEN 650 MG RE SUPP: 650.0000 mg | Freq: Four times a day (QID) | RECTAL | Status: DC | PRN

## 2020-09-06 MED ORDER — SODIUM CHLORIDE 0.9 % IV BOLUS
250.0000 mL | Freq: Once | INTRAVENOUS | Status: AC
  Administered 2020-09-06: 250 mL via INTRAVENOUS

## 2020-09-06 MED ORDER — SODIUM CHLORIDE 0.9 % IV BOLUS
500.0000 mL | Freq: Once | INTRAVENOUS | Status: AC
  Administered 2020-09-06: 500 mL via INTRAVENOUS

## 2020-09-06 MED ORDER — ATORVASTATIN CALCIUM 40 MG PO TABS
40.0000 mg | ORAL_TABLET | Freq: Every day | ORAL | Status: DC
  Administered 2020-09-06 – 2020-09-09 (×4): 40 mg via ORAL
  Filled 2020-09-06 (×4): qty 1

## 2020-09-06 MED ORDER — SODIUM CHLORIDE 0.9% FLUSH
3.0000 mL | Freq: Two times a day (BID) | INTRAVENOUS | Status: DC
  Administered 2020-09-06 – 2020-09-08 (×5): 3 mL via INTRAVENOUS

## 2020-09-06 MED ORDER — MELATONIN 5 MG PO TABS
5.0000 mg | ORAL_TABLET | Freq: Once | ORAL | Status: AC
  Administered 2020-09-06: 5 mg via ORAL
  Filled 2020-09-06: qty 1

## 2020-09-06 NOTE — ED Notes (Signed)
PA at bedside.

## 2020-09-06 NOTE — ED Provider Notes (Addendum)
[facility] DEPT Provider Note   CSN: [id number] Arrival date & time: 09/06/20  0955     History Chief Complaint  Patient presents with  . Abnormal Lab    James Greer is a 87 y.o. male.  HPI Patient is an 87 year old male with a history of chronic atrial fibrillation, CKD stage IIIa, hyperlipidemia, type 2 diabetes mellitus, hypertension, dementia, who presents the emergency department from the memory care unit at [facility] due to weakness.  Patient has a history of dementia and history is obtained from his caregiver who is at bedside as well as his daughter-in-law over the phone.  His daughter-in-law states that patient has a history of chronic atrial fibrillation and is anticoagulated on Eliquis.  He was initially on furosemide due to edema in the lower extremities as well as fluid buildup in the lungs.  She states that this was not improving on furosemide so he was switched to torsemide about 2 weeks ago.  This provided initial relief of his symptoms and he appeared to be ambulating better and having less shortness of breath.  Yesterday while ambulating he became lightheaded and had an acute episode of weakness.  They checked his basic labs and felt that he was "dehydrated" so they stopped his torsemide.  Patient was then sent to the emergency department for evaluation today.  His daughter-in-law states that he has a history of bradycardia in the past and his heart rate can get down to the high 30s and low 40s.  She states that he was evaluated by cardiology but due to his dementia as well as being a DNR, did not feel that a pacemaker was appropriate for the patient.  They felt that if patient was not symptomatic from his bradycardia then no intervention was necessary.  His daughter-in-law also states that in addition to stopping his torsemide they have also decided to recently stop both his Seroquel as well as his trazodone.   Cardiologist: Dr.  Warren Lacy  Patient is DNR    Past Medical History:  Diagnosis Date  . Carpal tunnel syndrome   . Chronic atrial fibrillation, unspecified ([place])   . Chronic kidney disease, stage 3a ([place])   . Degenerative disease of nervous system ([place])   . Hyperlipidemia   . Localized edema   . Other thrombophilia ([place])   . Rhinitis, allergic   . Toe fracture, left 05/27/2020   fracture of the proximal phalanx of the left first toe  . Trigger finger   . Type 2 diabetes mellitus ([place])   . Unspecified convulsions ([place])   . Unspecified dementia with behavioral disturbance ([place])   . Urolithiasis     Patient Active Problem List   Diagnosis Date Noted  . AKI (acute kidney injury) ([place]) 09/06/2020  . Essential hypertension 07/16/2020  . Acute on chronic diastolic CHF (congestive heart failure) ([place]) 06/24/2020  . Bradycardia 06/24/2020  . Right renal stone 06/07/2020  . Permanent atrial fibrillation ([place]) 05/29/2020  . CKD (chronic kidney disease), stage III ([place]) 05/08/2020  . Dementia ([place]) 02/28/2020  . Subdural hematoma ([place]) 02/28/2020  . Alcohol dependence ([place]) 02/28/2020  . Paroxysmal A-fib ([place]) 02/28/2020  . Secondary hypercoagulable state ([place]) 02/28/2020  . Right bundle branch block (RBBB) 02/28/2020  . Bilateral hearing loss 02/28/2020  . Right carpal tunnel syndrome 02/28/2020  . Hyperlipidemia associated with type 2 diabetes mellitus ([place]) 02/28/2020  . Type 2 diabetes mellitus with diabetic chronic kidney disease ([place]) 02/28/2020  . Benign prostatic hyperplasia  with urinary frequency 02/28/2020    Past Surgical History:  Procedure Laterality Date  . CRANIOTOMY     with evacuation of subdural hematoma  . EXTRACORPOREAL SHOCK WAVE LITHOTRIPSY         Family History  Problem Relation Age of Onset  . Aneurysm Sister     Social History   Tobacco Use  . Smoking status: Never Smoker  . Smokeless tobacco: Never Used  Vaping Use  . Vaping Use: Never used  Substance  Use Topics  . Alcohol use: Not Currently  . Drug use: Not Currently    Home Medications Prior to Admission medications   Medication Sig Start Date End Date Taking? Authorizing Provider  acetaminophen (TYLENOL) 500 MG tablet Take 650 mg by mouth in the morning, at noon, in the evening, and at bedtime.   Yes [provider]  apixaban (ELIQUIS) 5 MG TABS tablet Take 1 tablet (5 mg total) by mouth 2 (two) times daily. 05/29/20  Yes Fenton, Clint R, PA  atorvastatin (LIPITOR) 40 MG tablet Take 40 mg by mouth daily.   Yes [provider]  Glucerna (GLUCERNA) LIQD Take 237 mLs by mouth 2 (two) times daily between meals.   Yes [provider]  hydroxypropyl methylcellulose / hypromellose (ISOPTO TEARS / GONIOVISC) 2.5 % ophthalmic solution Place 1 drop into both eyes 3 (three) times daily as needed for dry eyes.   Yes [provider]  melatonin 3 MG TABS tablet Take 3 mg by mouth at bedtime.   Yes [provider]  Multiple Vitamins-Minerals (MULTI-DAY PLUS MINERALS PO) Take 1 tablet by mouth daily. 24m Iron-400 mcg-25 mcg   Yes [provider]  OVER THE COUNTER MEDICATION Take by mouth See admin instructions. 2 fiber thins with 8 ounces of water,juice or hot tea every morning.   Yes [provider]  OXYGEN Inhale 2 L into the lungs as directed. may titrate to 3L as needed to maintain oxygen saturation above 90%  Every Shift; Shift 1, Shift 2, Shift 3   Yes [provider]  sodium fluoride (PREVIDENT 5000 PLUS) 1.1 % CREA dental cream Place 1 application onto teeth every evening.   Yes [provider]    Allergies    Patient has no known allergies.  Review of Systems   Review of Systems  Unable to perform ROS: Dementia  All other systems reviewed and are negative.  Physical Exam Updated Vital Signs BP 135/76   Pulse (!) 40   Temp 97.7 F (36.5 C) (Oral)   Resp (!) 25   SpO2 97%   Physical Exam Vitals and  nursing note reviewed.  Constitutional:      General: He is not in acute distress.    Appearance: Normal appearance. He is not ill-appearing, toxic-appearing or diaphoretic.  HENT:     Head: Normocephalic and atraumatic.     Right Ear: External ear normal.     Left Ear: External ear normal.     Nose: Nose normal.     Mouth/Throat:     Mouth: Mucous membranes are moist.     Pharynx: Oropharynx is clear. No oropharyngeal exudate or posterior oropharyngeal erythema.  Eyes:     Extraocular Movements: Extraocular movements intact.  Cardiovascular:     Rate and Rhythm: Bradycardia present. Rhythm irregular.     Pulses: Normal pulses.     Heart sounds: Normal heart sounds. No murmur heard. No friction rub. No gallop.      Comments:  Irregularly irregular.  Bradycardic around 40 bpm. Pulmonary:     Effort: Pulmonary effort is normal. No respiratory distress.     Breath sounds: No stridor. Rales present. No wheezing or rhonchi.     Comments: Rales noted in the bilateral lung bases, left greater than right.  Oxygen saturations in the high 90s on room air. Abdominal:     General: Abdomen is flat.     Tenderness: There is no abdominal tenderness.  Musculoskeletal:        General: Normal range of motion.     Cervical back: Normal range of motion and neck supple. No tenderness.     Right lower leg: Edema present.     Left lower leg: Edema present.     Comments: 1+ pitting edema noted in the bilateral lower extremities.  Palpable DP pulses.  Skin:    General: Skin is warm and dry.  Neurological:     General: No focal deficit present.     Mental Status: He is alert.     Comments: Moving all 4 extremities with ease.  History of dementia.  Psychiatric:        Mood and Affect: Mood normal.        Behavior: Behavior normal.    ED Results / Procedures / Treatments   Labs (all labs ordered are listed, but only abnormal results are displayed) Labs Reviewed  COMPREHENSIVE METABOLIC PANEL -  Abnormal; Notable for the following components:      Result Value   Glucose, Bld 116 (*)    BUN 64 (*)    Creatinine, Ser 2.57 (*)    AST 52 (*)    ALT 50 (*)    Alkaline Phosphatase 138 (*)    Total Bilirubin 1.4 (*)    GFR, Estimated 24 (*)    All other components within normal limits  BRAIN NATRIURETIC PEPTIDE - Abnormal; Notable for the following components:   B Natriuretic Peptide 383.6 (*)    All other components within normal limits  CBC WITH DIFFERENTIAL/PLATELET - Abnormal; Notable for the following components:   RBC 4.15 (*)    Hemoglobin 12.9 (*)    RDW 17.6 (*)    All other components within normal limits  URINALYSIS, ROUTINE W REFLEX MICROSCOPIC - Abnormal; Notable for the following components:   Hgb urine dipstick SMALL (*)    Protein, ur 30 (*)    Leukocytes,Ua SMALL (*)    All other components within normal limits  MAGNESIUM - Abnormal; Notable for the following components:   Magnesium 2.5 (*)    All other components within normal limits  TROPONIN I (HIGH SENSITIVITY) - Abnormal; Notable for the following components:   Troponin I (High Sensitivity) 141 (*)    All other components within normal limits  TROPONIN I (HIGH SENSITIVITY) - Abnormal; Notable for the following components:   Troponin I (High Sensitivity) 138 (*)    All other components within normal limits  RESP PANEL BY RT-PCR (FLU A&B, COVID) [id number]   EKG EKG Interpretation  Date/Time:  Thursday September 06 2020 10:22:44 EDT Ventricular Rate:  34 PR Interval:    QRS Duration: 144 QT Interval:  679 QTC Calculation: 511 R Axis:   -78 Text Interpretation: Junctional bradycardia Non-specific ST-t changes Confirmed by Lajean Saver [phone]) on 09/06/2020 10:37:34 AM   Radiology DG Chest Portable 1 View  Result Date: 09/06/2020 CLINICAL DATA:  Weakness, fluid overload EXAM: PORTABLE CHEST 1 VIEW COMPARISON:  Prior chest x-ray 06/27/2020 FINDINGS: Stable  cardiomegaly. Pulmonary vascular congestion  bordering on mild edema. Chronic elevation of the right hemidiaphragm with associated right middle and lower lobe atelectasis. No new focal infiltrate. Suspect chronic right layering pleural effusion. No pneumothorax. IMPRESSION: 1. Cardiomegaly and pulmonary vascular congestion without overt edema. 2. Suspect chronic moderate right layering pleural effusion. Electronically Signed   By: Jacqulynn Cadet M.D.   On: 09/06/2020 11:11    Procedures Procedures   Medications Ordered in ED Medications  atorvastatin (LIPITOR) tablet 40 mg (has no administration in time range)  apixaban (ELIQUIS) tablet 5 mg (has no administration in time range)  acetaminophen (TYLENOL) tablet 650 mg (has no administration in time range)    Or  acetaminophen (TYLENOL) suppository 650 mg (has no administration in time range)  polyethylene glycol (MIRALAX / GLYCOLAX) packet 17 g (has no administration in time range)  sodium chloride flush (NS) 0.9 % injection 3 mL (3 mLs Intravenous Given 09/06/20 1434)  sodium chloride 0.9 % bolus 500 mL (0 mLs Intravenous Stopped 09/06/20 1434)    ED Course  I have reviewed the triage vital signs and the nursing notes.  Pertinent labs & imaging results that were available during my care of the patient were reviewed by me and considered in my medical decision making (see chart for details).  Clinical Course as of 09/06/20 1500  Thu Sep 06, 2020  1121 DG Chest Portable 1 View  IMPRESSION: 1. Cardiomegaly and pulmonary vascular congestion without overt edema. 2. Suspect chronic moderate right layering pleural effusion. [LJ]  1200 Creatinine(!): 2.57 BUN to creatinine ratio of 25.  Likely prerenal.  Possibly secondary to volume loss from recent torsemide use. [LJ]  1200 BUN(!): 64 [LJ]  1200 GFR, Estimated(!): 24 [LJ]  1200 B Natriuretic Peptide(!): 383.6 Appears to be mildly improved when compared to his baseline BNP. [LJ]  1201 Troponin I (High Sensitivity)(!!):  141 Chronically elevated troponin.  Slightly worse than his baseline. [LJ]  1450 Spoke to cardiology regarding the patient.  Agrees that this is likely secondary to torsemide use.  Recommends that his diuretic dosing be reduced prior to discharge.  Also agrees with fluid resuscitation. [LJ]    Clinical Course User Index [LJ] Rayna Sexton, PA-C   MDM Rules/Calculators/A&P                          Pt is a 87 y.o. male who presents the emergency department due to weakness as well as lightheadedness.  Patient was taking furosemide and this was recently switched to torsemide.  There was concern that he was becoming dehydrated so this was stopped in the last few days.  Labs: CBC with a hemoglobin of 12.9, RDW of 17.6. CMP with a glucose of 116, BUN of 64, creatinine of 2.57, AST of 52, ALT of 50, alk phos of 138, GFR 24.  Likely AKI.  Appears to be prerenal. BNP elevated at 383.6.  Improved compared to prior baseline values. Troponin elevated at 141 which appears to be similar to his baseline values. Respiratory panel is pending.  Imaging: Chest x-ray shows cardiomegaly and pulmonary vascular congestion without overt edema.  They suspect chronic moderate right layering pleural effusion.  I, Rayna Sexton, PA-C, personally reviewed and evaluated these images and lab results as part of my medical decision-making.  Patient appears to have an AKI today.  Likely prerenal.  Possibly secondary to his recent torsemide use and dehydration from this medication.  This was discussed with cardiology  and they agree.  Recommend gentle fluid resuscitation and reduction in his diuretics prior to discharge.  Patient given 500 cc of normal saline.  Will admit to the medicine team for further management.  Note: Portions of this report may have been transcribed using voice recognition software. Every effort was made to ensure accuracy; however, inadvertent computerized transcription errors may be present.    Final Clinical Impression(s) / ED Diagnoses Final diagnoses:  AKI (acute kidney injury) [facility])   Rx / DC Orders ED Discharge Orders    None       Rayna Sexton, PA-C 09/06/20 1303    Rayna Sexton, PA-C 09/06/20 1501    Lajean Saver, MD 09/08/20 2225

## 2020-09-06 NOTE — ED Notes (Signed)
[place],  notified via phone of troponin.

## 2020-09-06 NOTE — ED Notes (Signed)
ED TO INPATIENT HANDOFF REPORT  Name/Age/Gender James Greer 87 y.o. male  Code Status    Code Status Orders  (From admission, onward)         Start     Ordered   09/06/20 1358  Do not attempt resuscitation (DNR)  Continuous       Question Answer Comment  In the event of cardiac or respiratory ARREST Do not call a "code blue"   In the event of cardiac or respiratory ARREST Do not perform Intubation, CPR, defibrillation or ACLS   In the event of cardiac or respiratory ARREST Use medication by any route, position, wound care, and other measures to relive pain and suffering. May use oxygen, suction and manual treatment of airway obstruction as needed for comfort.      09/06/20 1357        Code Status History    Date Active Date Inactive Code Status Order ID Comments User Context   06/24/2020 2019 06/28/2020 0224 DNR [id number]  James Bow, DO ED   02/28/2020 1504 06/24/2020 1520 DNR [id number]  James Balo, DO Outpatient   Advance Care Planning Activity    Advance Directive Documentation   Flowsheet Row Most Recent Value  Type of Advance Directive Out of facility DNR (pink MOST or yellow form)  Pre-existing out of facility DNR order (yellow form or pink MOST form) Yellow form placed in chart (order not valid for inpatient use)  "MOST" Form in Place? --      Home/SNF/Other Nursing Home  Chief Complaint AKI (acute kidney injury) (HCC) [N17.9]  Level of Care/Admitting Diagnosis ED Disposition    ED Disposition Condition Comment   Admit  Hospital Area: [facility] [[id number]]  Level of Care: Telemetry [5]  Admit to tele based on following criteria: Complex arrhythmia (Bradycardia/Tachycardia)  May admit patient to [facility] or [facility] if equivalent level of care is available:: Yes  Covid Evaluation: Asymptomatic Screening Protocol (No Symptoms)  Diagnosis: AKI (acute kidney injury) [facility]) [[id number]]  Admitting Physician: Jae Dire  [[id number]]  Attending Physician: Jae Dire [[id number]]  Estimated length of stay: past midnight tomorrow  Certification:: I certify this patient will need inpatient services for at least 2 midnights       Medical History Past Medical History:  Diagnosis Date  . Carpal tunnel syndrome   . Chronic atrial fibrillation, unspecified (HCC)   . Chronic kidney disease, stage 3a (HCC)   . Degenerative disease of nervous system (HCC)   . Hyperlipidemia   . Localized edema   . Other thrombophilia (HCC)   . Rhinitis, allergic   . Toe fracture, left 05/27/2020   fracture of the proximal phalanx of the left first toe  . Trigger finger   . Type 2 diabetes mellitus (HCC)   . Unspecified convulsions (HCC)   . Unspecified dementia with behavioral disturbance (HCC)   . Urolithiasis     Allergies No Known Allergies  IV Location/Drains/Wounds Patient Lines/Drains/Airways Status    Active Line/Drains/Airways    Name Placement date Placement time Site Days   Peripheral IV 09/06/20 Left Forearm 09/06/20  1035  Forearm  less than 1          Labs/Imaging Results for orders placed or performed during the hospital encounter of 09/06/20 (from the past 48 hour(s))  Comprehensive metabolic panel     Status: Abnormal   Collection Time: 09/06/20 10:36 AM  Result Value Ref Range   Sodium 137  135 - 145 mmol/L   Potassium 4.8 3.5 - 5.1 mmol/L   Chloride 98 98 - 111 mmol/L   CO2 25 22 - 32 mmol/L   Glucose, Bld 116 (H) 70 - 99 mg/dL    Comment: Glucose reference range applies only to samples taken after fasting for at least 8 hours.   BUN 64 (H) 8 - 23 mg/dL   Creatinine, Ser 4.09 (H) 0.61 - 1.24 mg/dL   Calcium 9.4 8.9 - 81.1 mg/dL   Total Protein 7.3 6.5 - 8.1 g/dL   Albumin 3.7 3.5 - 5.0 g/dL   AST 52 (H) 15 - 41 U/L   ALT 50 (H) 0 - 44 U/L   Alkaline Phosphatase 138 (H) 38 - 126 U/L   Total Bilirubin 1.4 (H) 0.3 - 1.2 mg/dL   GFR, Estimated 24 (L) >60 mL/min    Comment:  (NOTE) Calculated using the CKD-EPI Creatinine Equation (2021)    Anion gap 14 5 - 15    Comment: Performed at [facility], [address] [place], [place] [address]  Brain natriuretic peptide     Status: Abnormal   Collection Time: 09/06/20 10:36 AM  Result Value Ref Range   B Natriuretic Peptide 383.6 (H) 0.0 - 100.0 pg/mL    Comment: Performed at [facility], [address], [place], [place] [address]  Troponin I (High Sensitivity)     Status: Abnormal   Collection Time: 09/06/20 10:36 AM  Result Value Ref Range   Troponin I (High Sensitivity) 141 (HH) <18 ng/L    Comment: CRITICAL RESULT CALLED TO, READ BACK BY AND VERIFIED WITH: BLAIR,I. RN AT 1148 09/06/20 MULLINS,T (NOTE) Elevated high sensitivity troponin I (hsTnI) values and significant  changes across serial measurements may suggest ACS but many other  chronic and acute conditions are known to elevate hsTnI results.  Refer to the Links section for chest pain algorithms and additional  guidance. Performed at [facility], [address], [place], [place] [address]   CBC with Differential     Status: Abnormal   Collection Time: 09/06/20 10:36 AM  Result Value Ref Range   WBC 7.5 4.0 - 10.5 K/uL   RBC 4.15 (L) 4.22 - 5.81 MIL/uL   Hemoglobin 12.9 (L) 13.0 - 17.0 g/dL   HCT 57.8 46.9 - 62.9 %   MCV 94.9 80.0 - 100.0 fL   MCH 31.1 26.0 - 34.0 pg   MCHC 32.7 30.0 - 36.0 g/dL   RDW 52.8 (H) 41.3 - 24.4 %   Platelets 168 150 - 400 K/uL   nRBC 0.0 0.0 - 0.2 %   Neutrophils Relative % 68 %   Neutro Abs 5.2 1.7 - 7.7 K/uL   Lymphocytes Relative 16 %   Lymphs Abs 1.2 0.7 - 4.0 K/uL   Monocytes Relative 13 %   Monocytes Absolute 0.9 0.1 - 1.0 K/uL   Eosinophils Relative 1 %   Eosinophils Absolute 0.1 0.0 - 0.5 K/uL   Basophils Relative 1 %   Basophils Absolute 0.1 0.0 - 0.1 K/uL   Immature Granulocytes 1 %   Abs Immature Granulocytes 0.04 0.00 - 0.07 K/uL     Comment: Performed at [facility], [address], [place], [place] [address]  Urinalysis, Routine w reflex microscopic Urine, Clean Catch     Status: Abnormal   Collection Time: 09/06/20  1:23 PM  Result Value Ref Range   Color, Urine YELLOW YELLOW  APPearance CLEAR CLEAR   Specific Gravity, Urine 1.014 1.005 - 1.030   pH 5.0 5.0 - 8.0   Glucose, UA NEGATIVE NEGATIVE mg/dL   Hgb urine dipstick SMALL (A) NEGATIVE   Bilirubin Urine NEGATIVE NEGATIVE   Ketones, ur NEGATIVE NEGATIVE mg/dL   Protein, ur 30 (A) NEGATIVE mg/dL   Nitrite NEGATIVE NEGATIVE   Leukocytes,Ua SMALL (A) NEGATIVE   RBC / HPF 6-10 0 - 5 RBC/hpf   WBC, UA 0-5 0 - 5 WBC/hpf   Bacteria, UA NONE SEEN NONE SEEN   Squamous Epithelial / LPF 0-5 0 - 5   Mucus PRESENT    Hyaline Casts, UA PRESENT     Comment: Performed at [facility], [address] [place], [place] [address]  Troponin I (High Sensitivity)     Status: Abnormal   Collection Time: 09/06/20  1:23 PM  Result Value Ref Range   Troponin I (High Sensitivity) 138 (HH) <18 ng/L    Comment: CRITICAL VALUE NOTED.  VALUE IS CONSISTENT WITH PREVIOUSLY REPORTED AND CALLED VALUE. (NOTE) Elevated high sensitivity troponin I (hsTnI) values and significant  changes across serial measurements may suggest ACS but many other  chronic and acute conditions are known to elevate hsTnI results.  Refer to the Links section for chest pain algorithms and additional  guidance. Performed at [facility], [address], [place], [place] [address]   Resp Panel by RT-PCR (Flu A&B, Covid) Urine, Clean Catch     Status: None   Collection Time: 09/06/20  1:23 PM   Specimen: Urine, Clean Catch; Nasopharyngeal(NP) swabs in vial transport medium  Result Value Ref Range   SARS Coronavirus 2 by RT PCR NEGATIVE NEGATIVE    Comment: (NOTE) SARS-CoV-2 target nucleic acids are NOT DETECTED.  The SARS-CoV-2 RNA is generally  detectable in upper respiratory specimens during the acute phase of infection. The lowest concentration of SARS-CoV-2 viral copies this assay can detect is 138 copies/mL. A negative result does not preclude SARS-Cov-2 infection and should not be used as the sole basis for treatment or other patient management decisions. A negative result may occur with  improper specimen collection/handling, submission of specimen other than nasopharyngeal swab, presence of viral mutation(s) within the areas targeted by this assay, and inadequate number of viral copies(<138 copies/mL). A negative result must be combined with clinical observations, patient history, and epidemiological information. The expected result is Negative.  Fact Sheet for Patients:  BloggerCourse.com  Fact Sheet for Healthcare Providers:  SeriousBroker.it  This test is no t yet approved or cleared by the [place] FDA and  has been authorized for detection and/or diagnosis of SARS-CoV-2 by FDA under an Emergency Use Authorization (EUA). This EUA will remain  in effect (meaning this test can be used) for the duration of the COVID-19 declaration under Section 564(b)(1) of the Act, 21 U.S.C.section 360bbb-3(b)(1), unless the authorization is terminated  or revoked sooner.       Influenza A by PCR NEGATIVE NEGATIVE   Influenza B by PCR NEGATIVE NEGATIVE    Comment: (NOTE) The Xpert Xpress SARS-CoV-2/FLU/RSV plus assay is intended as an aid in the diagnosis of influenza from Nasopharyngeal swab specimens and should not be used as a sole basis for treatment. Nasal washings and aspirates are unacceptable for Xpert Xpress SARS-CoV-2/FLU/RSV testing.  Fact Sheet for Patients: BloggerCourse.com  Fact Sheet for Healthcare Providers: SeriousBroker.it  This test is not yet approved or cleared by the [place] FDA and has  been authorized for detection and/or diagnosis of SARS-CoV-2 by FDA under an Emergency Use Authorization (EUA). This EUA will remain in effect (meaning this test can be used) for the duration of the COVID-19 declaration under Section 564(b)(1) of the Act, 21 U.S.C. section 360bbb-3(b)(1), unless the authorization is terminated or revoked.  Performed at [facility], [address] [place], [place] [address]   Magnesium     Status: Abnormal   Collection Time: 09/06/20  1:23 PM  Result Value Ref Range   Magnesium 2.5 (H) 1.7 - 2.4 mg/dL    Comment: Performed at [facility], [address], [place], [place] [address]   DG Chest Portable 1 View  Result Date: 09/06/2020 CLINICAL DATA:  Weakness, fluid overload EXAM: PORTABLE CHEST 1 VIEW COMPARISON:  Prior chest x-ray 06/27/2020 FINDINGS: Stable cardiomegaly. Pulmonary vascular congestion bordering on mild edema. Chronic elevation of the right hemidiaphragm with associated right middle and lower lobe atelectasis. No new focal infiltrate. Suspect chronic right layering pleural effusion. No pneumothorax. IMPRESSION: 1. Cardiomegaly and pulmonary vascular congestion without overt edema. 2. Suspect chronic moderate right layering pleural effusion. Electronically Signed   By: Malachy Moan M.D.   On: 09/06/2020 11:11   ECHOCARDIOGRAM COMPLETE  Result Date: 09/06/2020    ECHOCARDIOGRAM REPORT   Patient Name:   James Greer Date of Exam: 09/06/2020 Medical Rec #:  [id number]        Height:       73.0 in Accession #:    [id number]       Weight:       182.0 lb Date of Birth:  [birth year]        BSA:          2.067 m Patient Age:    86 years         BP:           135/76 mmHg Patient Gender: M                HR:           38 bpm. Exam Location:  Inpatient Procedure: 2D Echo, Cardiac Doppler and Color Doppler Indications:    Dyspnea  History:        Patient has prior history of Echocardiogram examinations, most                  recent 05/22/2020. Arrythmias:Atrial Fibrillation,                 Signs/Symptoms:Dyspnea and LE edema, dementia; Risk                 Factors:Hypertension and Dyslipidemia.  Sonographer:    Lavenia Atlas Referring Phys: [id number] JARED E SEGAL  Sonographer Comments: Image acquisition challenging due to respiratory motion and bradycardia. IMPRESSIONS  1. See previous comments regarding LVH and r/o amyloidosis similar to echo 05/22/20 No strain imaging performed may be useful as well to look for decrease with apical sparing . Left ventricular ejection fraction, by estimation, is 60 to 65%. The left ventricle has normal function. The left ventricle has no regional wall motion abnormalities. There is severe left ventricular hypertrophy. Left ventricular diastolic parameters were normal.  2. Right ventricular systolic function is moderately reduced. The right ventricular size is moderately enlarged. There is normal pulmonary artery systolic pressure.  3. Left atrial size was moderately dilated.  4. Right atrial size was mildly dilated.  5. The mitral valve is normal in structure. Mild  mitral valve regurgitation. No evidence of mitral stenosis.  6. The aortic valve is normal in structure. Aortic valve regurgitation is not visualized. No aortic stenosis is present.  7. The inferior vena cava is dilated in size with >50% respiratory variability, suggesting right atrial pressure of 8 mmHg. FINDINGS  Left Ventricle: See previous comments regarding LVH and r/o amyloidosis similar to echo 05/22/20 No strain imaging performed may be useful as well to look for decrease with apical sparing. Left ventricular ejection fraction, by estimation, is 60 to 65%.  The left ventricle has normal function. The left ventricle has no regional wall motion abnormalities. The left ventricular internal cavity size was normal in size. There is severe left ventricular hypertrophy. Left ventricular diastolic parameters were normal. Right  Ventricle: The right ventricular size is moderately enlarged. No increase in right ventricular wall thickness. Right ventricular systolic function is moderately reduced. There is normal pulmonary artery systolic pressure. The tricuspid regurgitant velocity is 2.50 m/s, and with an assumed right atrial pressure of 3 mmHg, the estimated right ventricular systolic pressure is 28.0 mmHg. Left Atrium: Left atrial size was moderately dilated. Right Atrium: Right atrial size was mildly dilated. Pericardium: There is no evidence of pericardial effusion. Mitral Valve: The mitral valve is normal in structure. Mild mitral valve regurgitation. No evidence of mitral valve stenosis. Tricuspid Valve: The tricuspid valve is normal in structure. Tricuspid valve regurgitation is mild . No evidence of tricuspid stenosis. Aortic Valve: The aortic valve is normal in structure. Aortic valve regurgitation is not visualized. No aortic stenosis is present. Pulmonic Valve: The pulmonic valve was normal in structure. Pulmonic valve regurgitation is not visualized. No evidence of pulmonic stenosis. Aorta: The aortic root is normal in size and structure. Venous: The inferior vena cava is dilated in size with greater than 50% respiratory variability, suggesting right atrial pressure of 8 mmHg. IAS/Shunts: No atrial level shunt detected by color flow Doppler.  LEFT VENTRICLE PLAX 2D LVIDd:         3.70 cm  Diastology LVIDs:         2.60 cm  LV e' medial:    4.79 cm/s LV PW:         2.10 cm  LV E/e' medial:  15.9 LV IVS:        1.70 cm  LV e' lateral:   5.44 cm/s LVOT diam:     2.10 cm  LV E/e' lateral: 14.0 LV SV:         46 LV SV Index:   22 LVOT Area:     3.46 cm  RIGHT VENTRICLE RV Basal diam:  4.20 cm RV S prime:     6.20 cm/s TAPSE (M-mode): 1.5 cm LEFT ATRIUM             Index       RIGHT ATRIUM           Index LA diam:        4.50 cm 2.18 cm/m  RA Area:     20.10 cm LA Vol (A2C):   82.5 ml 39.91 ml/m RA Volume:   59.50 ml  28.78 ml/m  LA Vol (A4C):   78.9 ml 38.17 ml/m LA Biplane Vol: 83.7 ml 40.49 ml/m  AORTIC VALVE LVOT Vmax:   74.40 cm/s LVOT Vmean:  55.800 cm/s LVOT VTI:    0.133 m  AORTA Ao Root diam: 3.50 cm MITRAL VALVE  TRICUSPID VALVE MV Area (PHT): 5.66 cm    TR Peak grad:   25.0 mmHg MV Decel Time: 134 msec    TR Vmax:        250.00 cm/s MV E velocity: 76.30 cm/s MV A velocity: 43.30 cm/s  SHUNTS MV E/A ratio:  1.76        Systemic VTI:  0.13 m                            Systemic Diam: 2.10 cm Charlton Haws MD Electronically signed by Charlton Haws MD Signature Date/Time: 09/06/2020/3:23:00 PM    Final     Pending Labs Unresulted Labs (From admission, onward)          Start     Ordered   09/07/20 0500  Basic metabolic panel  Daily,   R      09/06/20 1357   09/07/20 0500  CBC  Daily,   R      09/06/20 1357          Vitals/Pain Today's Vitals   09/06/20 1300 09/06/20 1330 09/06/20 1400 09/06/20 1430  BP: (!) 155/91 122/67 133/61 135/76  Pulse: (!) 43 71 (!) 43 (!) 40  Resp: 18 (!) 9 (!) 23 (!) 25  Temp:      TempSrc:      SpO2: 100% 97% 100% 97%    Isolation Precautions Airborne and Contact precautions  Medications Medications  atorvastatin (LIPITOR) tablet 40 mg (has no administration in time range)  apixaban (ELIQUIS) tablet 2.5 mg (has no administration in time range)  acetaminophen (TYLENOL) tablet 650 mg (has no administration in time range)    Or  acetaminophen (TYLENOL) suppository 650 mg (has no administration in time range)  polyethylene glycol (MIRALAX / GLYCOLAX) packet 17 g (has no administration in time range)  sodium chloride flush (NS) 0.9 % injection 3 mL (3 mLs Intravenous Given 09/06/20 1434)  sodium chloride 0.9 % bolus 500 mL (0 mLs Intravenous Stopped 09/06/20 1434)    Mobility walks with device

## 2020-09-06 NOTE — ED Triage Notes (Signed)
Pt brought in for lab work yesterday to show that he was very dehydrated. Pt has a fib but his heart rate has been running lower than his normal and pt getting more fatigued with exertion.

## 2020-09-06 NOTE — Telephone Encounter (Signed)
Patient currently in hospital.

## 2020-09-06 NOTE — Progress Notes (Signed)
   09/06/20 1743  Assess: MEWS Score  Level of Consciousness Alert  Assess: MEWS Score  MEWS Temp 0  MEWS Systolic 0  MEWS Pulse 2  MEWS RR 1  MEWS LOC 0  MEWS Score 3  MEWS Score Color Yellow  Assess: if the MEWS score is Yellow or Red  Focused Assessment No change from prior assessment  Early Detection of Sepsis Score *See Row Information* Low  MEWS guidelines implemented *See Row Information* No, previously yellow, continue vital signs every 4 hours  Take Vital Signs  Increase Vital Sign Frequency  Yellow: Q 2hr X 2 then Q 4hr X 2, if remains yellow, continue Q 4hrs

## 2020-09-06 NOTE — Progress Notes (Signed)
  Echocardiogram 2D Echocardiogram has been performed.  James Greer 09/06/2020, 2:58 PM

## 2020-09-06 NOTE — H&P (Addendum)
History and Physical        Hospital Admission Note Date: 09/06/2020  Patient name: James Greer Medical record number: [id number] Date of birth: Dec 07, 1933 Age: 87 y.o. Gender: male  PCP: Virgie Dad, MD    Chief Complaint    Chief Complaint  Patient presents with  . Abnormal Lab      HPI:   This is an 87 year old male with a past medical history of chronic diastolic CHF, atrial fibrillation on Eliquis, CKD 3a, type 2 diabetes, hyperlipidemia, tachy/bradycardia syndrome, advanced dementia and lives in a memory care unit, recurrent falls who presented to the ED today with abnormal lab work and generalized weakness.  According to prior notes and patient's adopted daughter at bedside, the patient was initially on furosemide due to lower extremity edema and fluid buildup in the lungs however was not improving on Lasix and so he was switched to torsemide about 2 weeks ago which provided initial relief in symptoms. Over the past week, however, he started having worsening shortness of breath on exertion and fatigue since Monday. He had basic lab work checked which showed an AKI and he was sent to the ED today.  He has a history of bradycardia with HR in 30s and 40s and was evaluated by EP cardiology in the office however was not deemed a good candidate for PPM due to his comorbidities and DNR. Currently, the patient does not have any significant complaints.  ED Course: Afebrile, bradycardic,  hemodynamically stable, on room air. Notable Labs: Sodium 137, K4.8, glucose 116, BUN 64, creatinine 6.57, alk phos 138, AST 52, ALT 50, T bili 1.4, BNP 383, troponin 141, WBC 7.5, Hb 12.9. Notable Imaging: CXR-cardiomegaly and pulmonary vascular congestion without overt edema, suspected chronic moderate right layering pleural effusion. Patient received 500 cc NS bolus.    Vitals:   09/06/20 1530  09/06/20 1600  BP: (!) 143/67 (!) 143/66  Pulse: (!) 33 (!) 37  Resp: 15 (!) 24  Temp:    SpO2: 99% 100%     Review of Systems:  Review of Systems  All other systems reviewed and are negative.   Medical/Social/Family History   Past Medical History: Past Medical History:  Diagnosis Date  . Carpal tunnel syndrome   . Chronic atrial fibrillation, unspecified ([place])   . Chronic kidney disease, stage 3a ([place])   . Degenerative disease of nervous system ([place])   . Hyperlipidemia   . Localized edema   . Other thrombophilia ([place])   . Rhinitis, allergic   . Toe fracture, left 05/27/2020   fracture of the proximal phalanx of the left first toe  . Trigger finger   . Type 2 diabetes mellitus ([place])   . Unspecified convulsions ([place])   . Unspecified dementia with behavioral disturbance ([place])   . Urolithiasis     Past Surgical History:  Procedure Laterality Date  . CRANIOTOMY     with evacuation of subdural hematoma  . EXTRACORPOREAL SHOCK WAVE LITHOTRIPSY      Medications: Prior to Admission medications   Medication Sig Start Date End Date Taking? Authorizing Provider  acetaminophen (TYLENOL) 500 MG tablet Take 650 mg by mouth in the morning, at noon, in the  evening, and at bedtime.   Yes [provider]  apixaban (ELIQUIS) 5 MG TABS tablet Take 1 tablet (5 mg total) by mouth 2 (two) times daily. 05/29/20  Yes Fenton, Clint R, PA  atorvastatin (LIPITOR) 40 MG tablet Take 40 mg by mouth daily.   Yes [provider]  Glucerna (GLUCERNA) LIQD Take 237 mLs by mouth 2 (two) times daily between meals.   Yes [provider]  hydroxypropyl methylcellulose / hypromellose (ISOPTO TEARS / GONIOVISC) 2.5 % ophthalmic solution Place 1 drop into both eyes 3 (three) times daily as needed for dry eyes.   Yes [provider]  melatonin 3 MG TABS tablet Take 3 mg by mouth at bedtime.   Yes [provider]  Multiple Vitamins-Minerals (MULTI-DAY PLUS  MINERALS PO) Take 1 tablet by mouth daily. 15m Iron-400 mcg-25 mcg   Yes [provider]  OVER THE COUNTER MEDICATION Take by mouth See admin instructions. 2 fiber thins with 8 ounces of water,juice or hot tea every morning.   Yes [provider]  OXYGEN Inhale 2 L into the lungs as directed. may titrate to 3L as needed to maintain oxygen saturation above 90%  Every Shift; Shift 1, Shift 2, Shift 3   Yes [provider]  sodium fluoride (PREVIDENT 5000 PLUS) 1.1 % CREA dental cream Place 1 application onto teeth every evening.   Yes [provider]    Allergies:  No Known Allergies  Social History:  reports that he has never smoked. He has never used smokeless tobacco. He reports previous alcohol use. He reports previous drug use.  Family History: Family History  Problem Relation Age of Onset  . Aneurysm Sister      Objective   Physical Exam: Blood pressure (!) 143/66, pulse (!) 37, temperature 97.7 F (36.5 C), temperature source Oral, resp. rate (!) 24, SpO2 100 %.  Physical Exam Vitals and nursing note reviewed.  Constitutional:      Appearance: Normal appearance.  HENT:     Head: Normocephalic and atraumatic.     Mouth/Throat:     Mouth: Mucous membranes are moist.  Eyes:     Conjunctiva/sclera: Conjunctivae normal.  Neck:     Comments: - JVD Cardiovascular:     Rate and Rhythm: Regular rhythm. Bradycardia present.     Heart sounds: No murmur heard.   Pulmonary:     Effort: Pulmonary effort is normal.     Breath sounds: Normal breath sounds.  Abdominal:     General: Abdomen is flat.     Palpations: Abdomen is soft.  Musculoskeletal:        General: No swelling or tenderness.  Skin:    Coloration: Skin is not jaundiced or pale.  Neurological:     Mental Status: He is alert. Mental status is at baseline.  Psychiatric:        Mood and Affect: Mood normal.        Behavior: Behavior normal.     LABS on Admission: I have  personally reviewed all the labs and imaging below    Basic Metabolic Panel: Recent Labs  Lab 09/06/20 1036 09/06/20 1323  NA 137  --   K 4.8  --   CL 98  --   CO2 25  --   GLUCOSE 116*  --   BUN 64*  --   CREATININE 2.57*  --   CALCIUM 9.4  --   MG  --  2.5*   Liver Function  Tests: Recent Labs  Lab 09/06/20 1036  AST 52*  ALT 50*  ALKPHOS 138*  BILITOT 1.4*  PROT 7.3  ALBUMIN 3.7   No results for input(s): LIPASE, AMYLASE in the last 168 hours. No results for input(s): AMMONIA in the last 168 hours. CBC: Recent Labs  Lab 09/06/20 1036  WBC 7.5  NEUTROABS 5.2  HGB 12.9*  HCT 39.4  MCV 94.9  PLT 168   Cardiac Enzymes: No results for input(s): CKTOTAL, CKMB, CKMBINDEX, TROPONINI in the last 168 hours. BNP: Invalid input(s): POCBNP CBG: No results for input(s): GLUCAP in the last 168 hours.  Radiological Exams on Admission:  DG Chest Portable 1 View  Result Date: 09/06/2020 CLINICAL DATA:  Weakness, fluid overload EXAM: PORTABLE CHEST 1 VIEW COMPARISON:  Prior chest x-ray 06/27/2020 FINDINGS: Stable cardiomegaly. Pulmonary vascular congestion bordering on mild edema. Chronic elevation of the right hemidiaphragm with associated right middle and lower lobe atelectasis. No new focal infiltrate. Suspect chronic right layering pleural effusion. No pneumothorax. IMPRESSION: 1. Cardiomegaly and pulmonary vascular congestion without overt edema. 2. Suspect chronic moderate right layering pleural effusion. Electronically Signed   By: Jacqulynn Cadet M.D.   On: 09/06/2020 11:11   ECHOCARDIOGRAM COMPLETE  Result Date: 09/06/2020    ECHOCARDIOGRAM REPORT   Patient Name:   James Greer Date of Exam: 09/06/2020 Medical Rec #:  [id number]        Height:       73.0 in Accession #:    [id number]       Weight:       182.0 lb Date of Birth:  09-Mar-1934        BSA:          2.067 m Patient Age:    35 years         BP:           135/76 mmHg Patient Gender: M                HR:            38 bpm. Exam Location:  Inpatient Procedure: 2D Echo, Cardiac Doppler and Color Doppler Indications:    Dyspnea  History:        Patient has prior history of Echocardiogram examinations, most                 recent 05/22/2020. Arrythmias:Atrial Fibrillation,                 Signs/Symptoms:Dyspnea and LE edema, dementia; Risk                 Factors:Hypertension and Dyslipidemia.  Sonographer:    Dustin Flock Referring Phys: [id number] Lorraine Terriquez E Sayvon Arterberry  Sonographer Comments: Image acquisition challenging due to respiratory motion and bradycardia. IMPRESSIONS  1. See previous comments regarding LVH and r/o amyloidosis similar to echo 05/22/20 No strain imaging performed may be useful as well to look for decrease with apical sparing . Left ventricular ejection fraction, by estimation, is 60 to 65%. The left ventricle has normal function. The left ventricle has no regional wall motion abnormalities. There is severe left ventricular hypertrophy. Left ventricular diastolic parameters were normal.  2. Right ventricular systolic function is moderately reduced. The right ventricular size is moderately enlarged. There is normal pulmonary artery systolic pressure.  3. Left atrial size was moderately dilated.  4. Right atrial size was mildly dilated.  5. The mitral valve is normal in structure. Mild mitral valve regurgitation. No evidence  of mitral stenosis.  6. The aortic valve is normal in structure. Aortic valve regurgitation is not visualized. No aortic stenosis is present.  7. The inferior vena cava is dilated in size with >50% respiratory variability, suggesting right atrial pressure of 8 mmHg. FINDINGS  Left Ventricle: See previous comments regarding LVH and r/o amyloidosis similar to echo 05/22/20 No strain imaging performed may be useful as well to look for decrease with apical sparing. Left ventricular ejection fraction, by estimation, is 60 to 65%.  The left ventricle has normal function. The left ventricle has no  regional wall motion abnormalities. The left ventricular internal cavity size was normal in size. There is severe left ventricular hypertrophy. Left ventricular diastolic parameters were normal. Right Ventricle: The right ventricular size is moderately enlarged. No increase in right ventricular wall thickness. Right ventricular systolic function is moderately reduced. There is normal pulmonary artery systolic pressure. The tricuspid regurgitant velocity is 2.50 m/s, and with an assumed right atrial pressure of 3 mmHg, the estimated right ventricular systolic pressure is 14.9 mmHg. Left Atrium: Left atrial size was moderately dilated. Right Atrium: Right atrial size was mildly dilated. Pericardium: There is no evidence of pericardial effusion. Mitral Valve: The mitral valve is normal in structure. Mild mitral valve regurgitation. No evidence of mitral valve stenosis. Tricuspid Valve: The tricuspid valve is normal in structure. Tricuspid valve regurgitation is mild . No evidence of tricuspid stenosis. Aortic Valve: The aortic valve is normal in structure. Aortic valve regurgitation is not visualized. No aortic stenosis is present. Pulmonic Valve: The pulmonic valve was normal in structure. Pulmonic valve regurgitation is not visualized. No evidence of pulmonic stenosis. Aorta: The aortic root is normal in size and structure. Venous: The inferior vena cava is dilated in size with greater than 50% respiratory variability, suggesting right atrial pressure of 8 mmHg. IAS/Shunts: No atrial level shunt detected by color flow Doppler.  LEFT VENTRICLE PLAX 2D LVIDd:         3.70 cm  Diastology LVIDs:         2.60 cm  LV e' medial:    4.79 cm/s LV PW:         2.10 cm  LV E/e' medial:  15.9 LV IVS:        1.70 cm  LV e' lateral:   5.44 cm/s LVOT diam:     2.10 cm  LV E/e' lateral: 14.0 LV SV:         46 LV SV Index:   22 LVOT Area:     3.46 cm  RIGHT VENTRICLE RV Basal diam:  4.20 cm RV S prime:     6.20 cm/s TAPSE (M-mode):  1.5 cm LEFT ATRIUM             Index       RIGHT ATRIUM           Index LA diam:        4.50 cm 2.18 cm/m  RA Area:     20.10 cm LA Vol (A2C):   82.5 ml 39.91 ml/m RA Volume:   59.50 ml  28.78 ml/m LA Vol (A4C):   78.9 ml 38.17 ml/m LA Biplane Vol: 83.7 ml 40.49 ml/m  AORTIC VALVE LVOT Vmax:   74.40 cm/s LVOT Vmean:  55.800 cm/s LVOT VTI:    0.133 m  AORTA Ao Root diam: 3.50 cm MITRAL VALVE               TRICUSPID VALVE MV Area (  PHT): 5.66 cm    TR Peak grad:   25.0 mmHg MV Decel Time: 134 msec    TR Vmax:        250.00 cm/s MV E velocity: 76.30 cm/s MV A velocity: 43.30 cm/s  SHUNTS MV E/A ratio:  1.76        Systemic VTI:  0.13 m                            Systemic Diam: 2.10 cm Jenkins Rouge MD Electronically signed by Jenkins Rouge MD Signature Date/Time: 09/06/2020/3:23:00 PM    Final       EKG: Junctional bradycardia, prolonged QT, nonspecific ST changes   A & P   Principal Problem:   AKI (acute kidney injury) ([place]) Active Problems:   Hyperlipidemia associated with type 2 diabetes mellitus ([facility])   CKD (chronic kidney disease), stage III ([facility])   Permanent atrial fibrillation ([facility])   Prolonged QT interval   1. Dyspnea on exertion  Generalized weakness of unclear etiology with history of Chronic diastolic CHF a. Will check an echo to follow up on his known pericardial effusion  b. Possibly symptomatic bradycardia vs. CHF vs. Chronic pleural effusion c. CXR with pulmonary vascular congestion without overt edema and BNP elevated, however below his baseline d. Intake/output and daily weights e. PT eval f. Received IV fluids in the ED due to AKI, will hold off on further IV fluids for now  2. AKI on CKD 3a a. Cr 2.57, baseline 1.2-1.4 b. Possibly from over diuresing from torsemide vs. Cardio renal c. Received IV fluids in the ED, hold off on further fluids for now  3. Bradycardia a. Seen by EP in the past and not deemed a candidate for PPM due to comorbidities  4. Permanent  Afib a. Eliquis decreased to 2.5 mg BID due to age and AKI  5. Hyperlipidemia  a. Continue statin  6. Type 2 diabetes a. Not on meds  7. Prolonged QT a. Hold QT prolonging meds  8. Elevated LFTs, unclear etiology a. RUQ [place]     DVT prophylaxis: eliquis   Code Status: DNR  Diet: heart healthy Family Communication: Admission, patients condition and plan of care including tests being ordered have been discussed with the patient who indicates understanding and agrees with the plan and Code Status. Patient's daughter was updated  Disposition Plan: The appropriate patient status for this patient is INPATIENT. Inpatient status is judged to be reasonable and necessary in order to provide the required intensity of service to ensure the patient's safety. The patient's presenting symptoms, physical exam findings, and initial radiographic and laboratory data in the context of their chronic comorbidities is felt to place them at high risk for further clinical deterioration. Furthermore, it is not anticipated that the patient will be medically stable for discharge from the hospital within 2 midnights of admission. The following factors support the patient status of inpatient.   " The patient's presenting symptoms include DOE, weakness. " The worrisome physical exam findings include bradycardia. " The initial radiographic and laboratory data are worrisome because of elevated BNP. " The chronic co-morbidities include bradycardia, CHF.   * I certify that at the point of admission it is my clinical judgment that the patient will require inpatient hospital care spanning beyond 2 midnights from the point of admission due to high intensity of service, high risk for further deterioration and high frequency of surveillance required.*  Consultants  . none  Procedures  . none  Time Spent on Admission: 63 minutes    Harold Hedge, DO [facility]  09/06/2020, 4:51 PM

## 2020-09-06 NOTE — Progress Notes (Signed)
This encounter was created in error - please disregard.

## 2020-09-07 ENCOUNTER — Inpatient Hospital Stay (HOSPITAL_COMMUNITY): Payer: Medicare HMO

## 2020-09-07 LAB — BASIC METABOLIC PANEL
Anion gap: 12 (ref 5–15)
BUN: 72 mg/dL — ABNORMAL HIGH (ref 8–23)
CO2: 28 mmol/L (ref 22–32)
Calcium: 9.5 mg/dL (ref 8.9–10.3)
Chloride: 100 mmol/L (ref 98–111)
Creatinine, Ser: 2.28 mg/dL — ABNORMAL HIGH (ref 0.61–1.24)
GFR, Estimated: 28 mL/min — ABNORMAL LOW (ref >60)
Glucose, Bld: 127 mg/dL — ABNORMAL HIGH (ref 70–99)
Potassium: 4.6 mmol/L (ref 3.5–5.1)
Sodium: 140 mmol/L (ref 135–145)

## 2020-09-07 LAB — CBC
HCT: 38.7 % — ABNORMAL LOW (ref 39.0–52.0)
Hemoglobin: 12.8 g/dL — ABNORMAL LOW (ref 13.0–17.0)
MCH: 31.4 pg (ref 26.0–34.0)
MCHC: 33.1 g/dL (ref 30.0–36.0)
MCV: 95.1 fL (ref 80.0–100.0)
Platelets: 181 10*3/uL (ref 150–400)
RBC: 4.07 MIL/uL — ABNORMAL LOW (ref 4.22–5.81)
RDW: 17.5 % — ABNORMAL HIGH (ref 11.5–15.5)
WBC: 7.1 10*3/uL (ref 4.0–10.5)
nRBC: 0 % (ref 0.0–0.2)

## 2020-09-07 MED ORDER — MELATONIN 3 MG PO TABS
3.0000 mg | ORAL_TABLET | Freq: Every day | ORAL | Status: DC
  Administered 2020-09-07 – 2020-09-08 (×2): 3 mg via ORAL
  Filled 2020-09-07 (×2): qty 1

## 2020-09-07 MED ORDER — FUROSEMIDE 10 MG/ML IJ SOLN
20.0000 mg | Freq: Once | INTRAMUSCULAR | Status: AC
  Administered 2020-09-07: 20 mg via INTRAVENOUS
  Filled 2020-09-07: qty 2

## 2020-09-07 NOTE — Evaluation (Signed)
Physical Therapy Evaluation Patient Details Name: James Greer MRN: [id number] DOB: 1934/03/04 Today's Date: 09/07/2020   History of Present Illness  Pt is 87 yo male admitted on 09/06/20 with weakness/abnormal lab work diagnosed with AKI, dyspnea on exerction from CHF, afib, and possible liver cirrhosis.  Pt has PMH including advanced dementia, CHF, DM, HLC, afib, bradycardia (30s-40s) and not pacemaker candidate.  Clinical Impression   Pt admitted with above diagnosis. Pt very pleasant but confused.  Overall, he moved well but did fatigue easily with ambulation which is not his baseline.  Encouraged ambulation with staff but would also benefit from acute PT while hospitalized to improve functional endurance to his baseline.  Pt's caregiver was present and helpful in assisting due to pt's dementia.   Pt currently with functional limitations due to the deficits listed below (see PT Problem List). Pt will benefit from skilled PT to increase their independence and safety with mobility to allow discharge to the venue listed below.       Follow Up Recommendations No PT follow up    Equipment Recommendations  None recommended by PT    Recommendations for Other Services       Precautions / Restrictions Precautions Precautions: Fall      Mobility  Bed Mobility Overal bed mobility: Independent                  Transfers Overall transfer level: Needs assistance Equipment used: None Transfers: Sit to/from Stand Sit to Stand: Supervision            Ambulation/Gait Ambulation/Gait assistance: Min guard Gait Distance (Feet): 75 Feet (75'x2) Assistive device: None Gait Pattern/deviations: Step-through pattern Gait velocity: decreased   General Gait Details: Very mild unsteadiness with gait but did fatigue easily with DOE of 3/4 requiring standing rest break. Cues for deep breathing. Caregiver present and reports pt has only had decreased endurance recently  Stairs             Wheelchair Mobility    Modified Rankin (Stroke Patients Only)       Balance Overall balance assessment: Needs assistance Sitting-balance support: No upper extremity supported Sitting balance-Leahy Scale: Good     Standing balance support: No upper extremity supported Standing balance-Leahy Scale: Good                               Pertinent Vitals/Pain Pain Assessment: No/denies pain    Home Living Family/patient expects to be discharged to:: Other (Comment)                 Additional Comments: Pt from [facility]    Prior Function Level of Independence: Needs assistance   Gait / Transfers Assistance Needed: Walks without RW or assist; likes to walk and exercise  ADL's / Homemaking Assistance Needed: Has supervision with ADLs  Comments: Has caregivers 7 days week 12 hr day; caregiver present and provided history - reports decline in endurance and mobility in past few days     Hand Dominance        Extremity/Trunk Assessment   Upper Extremity Assessment Upper Extremity Assessment: Overall WFL for tasks assessed    Lower Extremity Assessment Lower Extremity Assessment: Overall WFL for tasks assessed    Cervical / Trunk Assessment Cervical / Trunk Assessment: Normal  Communication   Communication: No difficulties  Cognition Arousal/Alertness: Awake/alert Behavior During Therapy: WFL for tasks assessed/performed Overall Cognitive Status: History of  cognitive impairments - at baseline                                 General Comments: pleasant and able to follow basic commands with multimodal cues and increased time; caregiver was helpful in getting pt to participate      General Comments General comments (skin integrity, edema, etc.): Pt on RA with sats 95% or >.  HR 40's but that is his baseline    Exercises     Assessment/Plan    PT Assessment Patient needs continued PT services  PT Problem List  Decreased mobility;Decreased activity tolerance;Cardiopulmonary status limiting activity;Decreased balance;Decreased strength       PT Treatment Interventions DME instruction;Therapeutic activities;Gait training;Therapeutic exercise;Patient/family education;Balance training;Functional mobility training    PT Goals (Current goals can be found in the Care Plan section)  Acute Rehab PT Goals Patient Stated Goal: pt unable to state PT Goal Formulation: With patient/family Time For Goal Achievement: 09/21/20 Potential to Achieve Goals: Good    Frequency Min 2X/week   Barriers to discharge        Co-evaluation               AM-PAC PT "6 Clicks" Mobility  Outcome Measure Help needed turning from your back to your side while in a flat bed without using bedrails?: None Help needed moving from lying on your back to sitting on the side of a flat bed without using bedrails?: None Help needed moving to and from a bed to a chair (including a wheelchair)?: A Little Help needed standing up from a chair using your arms (e.g., wheelchair or bedside chair)?: A Little Help needed to walk in hospital room?: A Little Help needed climbing 3-5 steps with a railing? : A Little 6 Click Score: 20    End of Session Equipment Utilized During Treatment: Gait belt Activity Tolerance: Patient tolerated treatment well Patient left: in chair;with call bell/phone within reach (pt's caregiver present and reports she will be with him at all times and has been assisting him OOB - left chair alarm off) Nurse Communication: Mobility status PT Visit Diagnosis: Other abnormalities of gait and mobility (R26.89)    Time: 1694-5038 PT Time Calculation (min) (ACUTE ONLY): 20 min   Charges:   PT Evaluation $PT Eval Low Complexity: 1 Low          Raisa Ditto, PT Acute Rehab Services Pager [phone] [facility] [phone]    Rayetta Humphrey 09/07/2020, 2:58 PM

## 2020-09-07 NOTE — Progress Notes (Addendum)
PROGRESS NOTE  James Greer  DOB: [birth year]  PCP: Mahlon Gammon, MD AJO:[id number]  DOA: 09/06/2020  LOS: 1 day   Chief Complaint  Patient presents with  . Abnormal Lab   Brief narrative: James Greer is a 87 y.o. male with PMH significant for advanced dementia, DM 2, HLD, A. fib on Eliquis who lives in a memory care unit. Patient was sent to the ED on 4/7 for generalized weakness and abnormal blood work. Patient follows up with cardiologist Dr. Antoine Poche.  He was on Lasix for CHF.  About 2 weeks ago, Lasix was switched to torsemide after he started to have shortness of breath, weight gain. Labs done at the facility few days prior to this presentation showed showed elevated creatinine and hence patient was sent to the ED on 4/7.  In the ED, patient was bradycardic to 40s at rest, breathing on room air. Creatinine elevated to 2.57, BNP elevated 383, troponin elevated 141. Chest x-ray showed cardiomegaly and pulmonary vascular congestion without overt edema, suspected chronic moderate right layering pleural effusion. Ultrasound right upper quadrant showed markedly thickened and hydropic gallbladder wall suspicious for acute acalculous cholecystitis versus chronic gallbladder wall thickening related to intrinsic liver disease and/or hypoalbuminemia.  No biliary ductal dilatation.  It also showed possibility of hepatic cirrhosis with moderate ascites and moderate right pleural effusion. Patient was admitted to hospitalist service for further evaluation and management.  Subjective: Patient was seen and examined this morning. Pleasant elderly Caucasian male.  Alert, awake.  Able to verbalize but not oriented to person, place or time. His daughter-in-law Ms. Annice Pih and caregiver were at bedside.  Assessment/Plan: Dyspnea on exertion -Seems to have volume overload status related to history of chronic diastolic CHF.  May also have underlying liver cirrhosis with portal hypertension as  evidenced by pleural effusion and ascites. -4/7, echocardiogram showed EF of 60 to 65%, with severe LVH, moderately reduced RV systolic function, moderately enlarged RV size. -I will start him on Lasix 20 mg IV daily and watch his renal function. -He is not requiring supplemental oxygen at this time.  AKI on CKD 3a -Baseline creatinine 1.2-1.4.  Presented with a creatinine elevated to 2.57.  AKI likely due to overdiuresis however patient also has signs of volume overload.  Could also be due to poor renal perfusion status related to chronic bradycardia and congestion.  Need to suspect cardiorenal versus hepatorenal syndrome as well.  Creatinine is slightly better today at 2.28.   -Avoid IV fluids for now.  Monitor the response with Lasix IV 20 mg daily. Recent Labs    01/23/20 0000 02/28/20 0000 06/24/20 1611 06/25/20 0527 06/26/20 0956 06/27/20 0550 07/06/20 1304 08/30/20 0000 09/06/20 1036 09/07/20 0839  BUN 24*  --  40* 36* 40* 37* 30* 37* 64* 72*  CREATININE 1.1 1.3 1.54* 1.49* 1.50* 1.29* 1.39* 1.4* 2.57* 2.28*   Permanent A. fib Chronic bradycardia -Heart rate in 30s and 40s chronically.  Junctional rhythm in EKG.  Per history, he was seen by EP in the past and deemed a candidate for PPM due to comorbidities -Eliquis decreased to 2.5 mg BID due to age and AKI Prolonged QT -QTc 511 ms.  Hold QT prolonging meds  Possible liver cirrhosis with portal hypertension Ascites -Ultrasound abdomen raised suspicion of liver cirrhosis.  Also showed moderate ascites and pleural effusion.  Liver enzymes elevated. -Unclear etiology.  No history of alcoholism.  May be related to CHF as he has severe LVH, reduced RV  function.  In any case, will need to manage his volume status.  IV Lasix.  Also ordered for ultrasound-guided paracentesis today. -We will give a statin on hold. Recent Labs  Lab 09/06/20 1036  AST 52*  ALT 50*  ALKPHOS 138*  BILITOT 1.4*  PROT 7.3  ALBUMIN 3.7    Hyperlipidemia  -Statin on hold.  Type 2 diabetes -A1c 6.7 in 2021. -Not on meds. -Blood sugar level normal.  Chronic pleural effusion -Since patient is not requiring oxygen.  I would avoid thoracentesis for now as he has chance of reaccumulation of effusion again.  Dementia -Cheerfully demented.  In the memory care unit at ALF. -Continue nightly melatonin.  Seroquel and trazodone were stopped in the past because of bradycardia.  Mobility: PT eval pending Code Status:   Code Status: DNR  Nutritional status: Body mass index is 23.37 kg/m.     Diet Order            Diet Heart Room service appropriate? Yes; Fluid consistency: Thin  Diet effective now                 DVT prophylaxis: apixaban (ELIQUIS) tablet 2.5 mg Start: 09/06/20 1530 apixaban (ELIQUIS) tablet 2.5 mg   Antimicrobials:  None Fluid: None Consultants: None Family Communication:  Discussed with patient daughter-in-law Ms. Annice Pih at bedside  Status is: Inpatient  Remains inpatient appropriate because: Remains volume overloaded with AKI  Dispo: The patient is from: ALF memory care unit              Anticipated d/c is to: ALF              Patient currently is not medically stable to d/c.   Difficult to place patient No       Infusions:    Scheduled Meds: . apixaban  2.5 mg Oral BID  . atorvastatin  40 mg Oral Daily  . furosemide  20 mg Intravenous Once  . melatonin  3 mg Oral QHS  . sodium chloride flush  3 mL Intravenous Q12H    Antimicrobials: Anti-infectives (From admission, onward)   None      PRN meds: acetaminophen **OR** acetaminophen, polyethylene glycol   Objective: Vitals:   09/07/20 0744 09/07/20 1018  BP: (!) 144/72 124/89  Pulse: (!) 34 (!) 33  Resp: 19 18  Temp: 97.6 F (36.4 C)   SpO2: 98% 98%    Intake/Output Summary (Last 24 hours) at 09/07/2020 1151 Last data filed at 09/07/2020 0900 Gross per 24 hour  Intake 940 ml  Output 120 ml  Net 820 ml    Filed Weights   09/06/20 1700 09/07/20 0500  Weight: 85.8 kg 84.8 kg   Weight change:  Body mass index is 23.37 kg/m.   Physical Exam: General exam: Pleasant elderly Caucasian male.  Alert, awake, cheerfully demented Skin: No rashes, lesions or ulcers. HEENT: Atraumatic, normocephalic, no obvious bleeding Lungs: Clear to auscultation bilaterally CVS: Chronic bradycardia, no murmur GI/Abd soft, nontender, nondistended, bowel sound present CNS: Alert, awake, able to verbalize but not oriented to time place or person Psychiatry: Cheerful Extremities: Trace bilateral pedal edema  Data Review: I have personally reviewed the laboratory data and studies available.  Recent Labs  Lab 09/06/20 1036 09/07/20 0839  WBC 7.5 7.1  NEUTROABS 5.2  --   HGB 12.9* 12.8*  HCT 39.4 38.7*  MCV 94.9 95.1  PLT 168 181   Recent Labs  Lab 09/06/20 1036 09/06/20 1323 09/07/20 0839  NA 137  --  140  K 4.8  --  4.6  CL 98  --  100  CO2 25  --  28  GLUCOSE 116*  --  127*  BUN 64*  --  72*  CREATININE 2.57*  --  2.28*  CALCIUM 9.4  --  9.5  MG  --  2.5*  --     F/u labs ordered Unresulted Labs (From admission, onward)          Start     Ordered   09/07/20 0500  Basic metabolic panel  Daily,   R      09/06/20 1357   09/07/20 0500  CBC  Daily,   R      09/06/20 1357          Signed, Lorin Glass, MD [facility] 09/07/2020

## 2020-09-07 NOTE — Progress Notes (Signed)
   09/07/20 0321  Assess: MEWS Score  Temp (!) 97.4 F (36.3 C)  BP 137/82  Pulse Rate (!) 37 (nurse notified)  Resp 18  Level of Consciousness Alert  SpO2 100 % (rechecked.)  O2 Device Room Air  Assess: MEWS Score  MEWS Temp 0  MEWS Systolic 0  MEWS Pulse 2  MEWS RR 0  MEWS LOC 0  MEWS Score 2  MEWS Score Color Yellow  Assess: if the MEWS score is Yellow or Red  Were vital signs taken at a resting state? Yes  Focused Assessment No change from prior assessment  Early Detection of Sepsis Score *See Row Information* Low  MEWS guidelines implemented *See Row Information* No, previously yellow, continue vital signs every 4 hours

## 2020-09-08 LAB — BASIC METABOLIC PANEL
Anion gap: 14 (ref 5–15)
BUN: 70 mg/dL — ABNORMAL HIGH (ref 8–23)
CO2: 24 mmol/L (ref 22–32)
Calcium: 9.4 mg/dL (ref 8.9–10.3)
Chloride: 101 mmol/L (ref 98–111)
Creatinine, Ser: 1.91 mg/dL — ABNORMAL HIGH (ref 0.61–1.24)
GFR, Estimated: 34 mL/min — ABNORMAL LOW (ref >60)
Glucose, Bld: 136 mg/dL — ABNORMAL HIGH (ref 70–99)
Potassium: 4.2 mmol/L (ref 3.5–5.1)
Sodium: 139 mmol/L (ref 135–145)

## 2020-09-08 LAB — CBC
HCT: 39.9 % (ref 39.0–52.0)
Hemoglobin: 13 g/dL (ref 13.0–17.0)
MCH: 31.1 pg (ref 26.0–34.0)
MCHC: 32.6 g/dL (ref 30.0–36.0)
MCV: 95.5 fL (ref 80.0–100.0)
Platelets: 201 10*3/uL (ref 150–400)
RBC: 4.18 MIL/uL — ABNORMAL LOW (ref 4.22–5.81)
RDW: 17.6 % — ABNORMAL HIGH (ref 11.5–15.5)
WBC: 6.2 10*3/uL (ref 4.0–10.5)
nRBC: 0 % (ref 0.0–0.2)

## 2020-09-08 MED ORDER — ALPRAZOLAM 0.25 MG PO TABS
0.2500 mg | ORAL_TABLET | Freq: Every evening | ORAL | Status: DC | PRN
  Administered 2020-09-08: 0.25 mg via ORAL
  Filled 2020-09-08: qty 1

## 2020-09-08 MED ORDER — APIXABAN 5 MG PO TABS
5.0000 mg | ORAL_TABLET | Freq: Two times a day (BID) | ORAL | Status: DC
  Administered 2020-09-08 – 2020-09-09 (×2): 5 mg via ORAL
  Filled 2020-09-08 (×2): qty 1

## 2020-09-08 MED ORDER — LORAZEPAM 0.5 MG PO TABS
0.5000 mg | ORAL_TABLET | Freq: Once | ORAL | Status: DC
  Filled 2020-09-08: qty 1

## 2020-09-08 MED ORDER — FUROSEMIDE 10 MG/ML IJ SOLN
20.0000 mg | Freq: Once | INTRAMUSCULAR | Status: AC
  Administered 2020-09-08: 20 mg via INTRAVENOUS
  Filled 2020-09-08: qty 2

## 2020-09-08 MED ORDER — ALPRAZOLAM 0.25 MG PO TABS
0.2500 mg | ORAL_TABLET | Freq: Once | ORAL | Status: AC
  Administered 2020-09-08: 0.25 mg via ORAL
  Filled 2020-09-08: qty 1

## 2020-09-08 NOTE — Progress Notes (Signed)
PROGRESS NOTE  James Greer  DOB: 07-Feb-1934  PCP: Mahlon Gammon, MD ZOX:[id number]  DOA: 09/06/2020  LOS: 2 days   Chief Complaint  Patient presents with  . Abnormal Lab   Brief narrative: James Greer is a 87 y.o. male with PMH significant for advanced dementia, DM 2, HLD, A. fib on Eliquis who lives in a memory care unit. Patient was sent to the ED on 4/7 for generalized weakness and abnormal blood work. Patient follows up with cardiologist Dr. Antoine Poche.  He was on Lasix for CHF.  About 2 weeks ago, Lasix was switched to torsemide after he started to have shortness of breath, weight gain. Labs done at the facility few days prior to this presentation showed showed elevated creatinine and hence patient was sent to the ED on 4/7.  In the ED, patient was bradycardic to 40s at rest, breathing on room air. Creatinine elevated to 2.57, BNP elevated 383, troponin elevated 141. Chest x-ray showed cardiomegaly and pulmonary vascular congestion without overt edema, suspected chronic moderate right layering pleural effusion. Ultrasound right upper quadrant showed markedly thickened and hydropic gallbladder wall suspicious for acute acalculous cholecystitis versus chronic gallbladder wall thickening related to intrinsic liver disease and/or hypoalbuminemia.  No biliary ductal dilatation.  It also showed possibility of hepatic cirrhosis with moderate ascites and moderate right pleural effusion. Patient was admitted to hospitalist service for further evaluation and management.  Subjective: Patient was seen and examined this morning. Pleasantly demented.  Calm.  However last night he was restless and pulled his IV line out.  He comes in after a dose of 0.25 mg Xanax oral was given.  Assessment/Plan: Acute exacerbation of chronic diastolic CHF -Seems to have volume overload status related to history of chronic diastolic CHF. May also have underlying liver cirrhosis with portal hypertension  as evidenced by pleural effusion and ascites. -4/7, echocardiogram showed EF of 60 to 65%, with severe LVH, moderately reduced RV systolic function, moderately enlarged RV size. -I gave him Lasix 20 mg IV yesterday.  Renal function is improving.  Continue IV Lasix.  AKI on CKD 3a -Baseline creatinine 1.2-1.4.  Presented with a creatinine elevated to 2.57.   -Elevated creatinine likely because of congestive nephropathy.  Improving creatinine with diuresis.  Continue diuresis.   Recent Labs    01/23/20 0000 02/28/20 0000 06/24/20 1611 06/25/20 0527 06/26/20 0956 06/27/20 0550 07/06/20 1304 08/30/20 0000 09/06/20 1036 09/07/20 0839 09/08/20 0810  BUN 24*  --  40* 36* 40* 37* 30* 37* 64* 72* 70*  CREATININE 1.1 1.3 1.54* 1.49* 1.50* 1.29* 1.39* 1.4* 2.57* 2.28* 1.91*   Permanent A. fib Chronic bradycardia -Heart rate in 30s and 40s chronically.  Junctional rhythm in EKG.  Per history, he was seen by EP in the past and deemed a candidate for PPM due to comorbidities -Continue Eliquis  Prolonged QT -QTc 511 ms.  Hold QT prolonging meds  Possible liver cirrhosis with portal hypertension Ascites -Ultrasound abdomen raised suspicion of liver cirrhosis.  Also showed moderate ascites and pleural effusion.  Liver enzymes elevated. -Unclear etiology.  No history of alcoholism.  May be related to CHF as he has severe LVH, reduced RV function.  In any case, will need to manage his volume status.  IV Lasix.  4/8, paracentesis was tried but could not be done because he did not have enough fluid to be removed. -We will give a statin on hold. Recent Labs  Lab 09/06/20 1036  AST 52*  ALT 50*  ALKPHOS 138*  BILITOT 1.4*  PROT 7.3  ALBUMIN 3.7   Hyperlipidemia  -Statin on hold.  Type 2 diabetes -A1c 6.7 in 2021. -Not on meds. -Blood sugar level normal.  Chronic pleural effusion -Respiratory status stable.  Dementia with behavioral disturbance -Cheerfully demented.  In the memory  care unit at ALF. -Continue nightly melatonin.  Seroquel and trazodone were stopped in the past because of bradycardia. -Xanax 0.25 mg was given last night after which he rested well.  I ordered for QHS PRN.  Melatonin at bedtime for sleep  Mobility: PT eval  Code Status:   Code Status: DNR  Nutritional status: Body mass index is 23.37 kg/m.     Diet Order            Diet Heart Room service appropriate? Yes; Fluid consistency: Thin  Diet effective now                 DVT prophylaxis: apixaban (ELIQUIS) tablet 2.5 mg Start: 09/06/20 1530 apixaban (ELIQUIS) tablet 2.5 mg   Antimicrobials:  None Fluid: None Consultants: None Family Communication:  Discussed with patient daughter-in-law Ms. Annice Pih at bedside  Status is: Inpatient  Remains inpatient appropriate because: Remains volume overloaded with AKI  Dispo: The patient is from: ALF memory care unit              Anticipated d/c is to: ALF              Patient currently is not medically stable to d/c.   Difficult to place patient No       Infusions:    Scheduled Meds: . apixaban  2.5 mg Oral BID  . atorvastatin  40 mg Oral Daily  . furosemide  20 mg Intravenous Once  . melatonin  3 mg Oral QHS  . sodium chloride flush  3 mL Intravenous Q12H    Antimicrobials: Anti-infectives (From admission, onward)   None      PRN meds: acetaminophen **OR** acetaminophen, ALPRAZolam, polyethylene glycol   Objective: Vitals:   09/08/20 0555 09/08/20 1002  BP: 120/73 (!) 147/71  Pulse:  (!) 32  Resp: 20 (!) 24  Temp: (!) 97.2 F (36.2 C) (!) 97.4 F (36.3 C)  SpO2: 95% 93%    Intake/Output Summary (Last 24 hours) at 09/08/2020 1319 Last data filed at 09/08/2020 1050 Gross per 24 hour  Intake 480 ml  Output 250 ml  Net 230 ml   Filed Weights   09/06/20 1700 09/07/20 0500  Weight: 85.8 kg 84.8 kg   Weight change:  Body mass index is 23.37 kg/m.   Physical Exam: General exam: Pleasant elderly Caucasian  male.  Alert, awake, cheerfully demented Skin: No rashes, lesions or ulcers. HEENT: Atraumatic, normocephalic, no obvious bleeding Lungs: Clear to auscultation bilaterally CVS: Chronic bradycardia, no murmur GI/Abd soft, nontender, nondistended, bowel sound present CNS: Alert, awake, able to verbalize but not oriented to time place or person.  Not restless or agitated at this time Psychiatry: Cheerful Extremities: Trace bilateral pedal edema, bilateral improving.  Data Review: I have personally reviewed the laboratory data and studies available.  Recent Labs  Lab 09/06/20 1036 09/07/20 0839 09/08/20 0810  WBC 7.5 7.1 6.2  NEUTROABS 5.2  --   --   HGB 12.9* 12.8* 13.0  HCT 39.4 38.7* 39.9  MCV 94.9 95.1 95.5  PLT 168 181 201   Recent Labs  Lab 09/06/20 1036 09/06/20 1323 09/07/20 0839 09/08/20 0810  NA 137  --  140 139  K 4.8  --  4.6 4.2  CL 98  --  100 101  CO2 25  --  28 24  GLUCOSE 116*  --  127* 136*  BUN 64*  --  72* 70*  CREATININE 2.57*  --  2.28* 1.91*  CALCIUM 9.4  --  9.5 9.4  MG  --  2.5*  --   --     F/u labs ordered Unresulted Labs (From admission, onward)          Start     Ordered   09/07/20 0500  Basic metabolic panel  Daily,   R      09/06/20 1357   09/07/20 0500  CBC  Daily,   R      09/06/20 1357          Signed, Lorin Glass, MD [facility] 09/08/2020

## 2020-09-08 NOTE — Progress Notes (Signed)
Patient did not sleep well during night shift, However after given Xanax he rested well. He pulled out his PIV about 4 AM.

## 2020-09-08 NOTE — Progress Notes (Signed)
Per Patient's personal sitter he does not do well with Ativan so medication has not been given but now he is refusing tele and take off the monitor as well. Sitting on chair and refusing to go to back to bed.  On call Blount X,NP was notified.

## 2020-09-09 LAB — CBC WITH DIFFERENTIAL/PLATELET
Abs Immature Granulocytes: 0.01 10*3/uL (ref 0.00–0.07)
Basophils Absolute: 0.1 10*3/uL (ref 0.0–0.1)
Basophils Relative: 1 %
Eosinophils Absolute: 0.2 10*3/uL (ref 0.0–0.5)
Eosinophils Relative: 3 %
HCT: 39.1 % (ref 39.0–52.0)
Hemoglobin: 12.7 g/dL — ABNORMAL LOW (ref 13.0–17.0)
Immature Granulocytes: 0 %
Lymphocytes Relative: 18 %
Lymphs Abs: 1 10*3/uL (ref 0.7–4.0)
MCH: 30.8 pg (ref 26.0–34.0)
MCHC: 32.5 g/dL (ref 30.0–36.0)
MCV: 94.9 fL (ref 80.0–100.0)
Monocytes Absolute: 0.8 10*3/uL (ref 0.1–1.0)
Monocytes Relative: 14 %
Neutro Abs: 3.8 10*3/uL (ref 1.7–7.7)
Neutrophils Relative %: 64 %
Platelets: 187 10*3/uL (ref 150–400)
RBC: 4.12 MIL/uL — ABNORMAL LOW (ref 4.22–5.81)
RDW: 17.7 % — ABNORMAL HIGH (ref 11.5–15.5)
WBC: 5.9 10*3/uL (ref 4.0–10.5)
nRBC: 0 % (ref 0.0–0.2)

## 2020-09-09 LAB — BASIC METABOLIC PANEL
Anion gap: 10 (ref 5–15)
BUN: 66 mg/dL — ABNORMAL HIGH (ref 8–23)
CO2: 27 mmol/L (ref 22–32)
Calcium: 9.2 mg/dL (ref 8.9–10.3)
Chloride: 103 mmol/L (ref 98–111)
Creatinine, Ser: 1.87 mg/dL — ABNORMAL HIGH (ref 0.61–1.24)
GFR, Estimated: 35 mL/min — ABNORMAL LOW (ref >60)
Glucose, Bld: 119 mg/dL — ABNORMAL HIGH (ref 70–99)
Potassium: 3.7 mmol/L (ref 3.5–5.1)
Sodium: 140 mmol/L (ref 135–145)

## 2020-09-09 MED ORDER — FUROSEMIDE 20 MG PO TABS: ORAL_TABLET | ORAL | 2 refills | Status: DC

## 2020-09-09 MED ORDER — ALPRAZOLAM 0.25 MG PO TABS: 0.2500 mg | ORAL_TABLET | Freq: Every evening | ORAL | 0 refills | Status: AC | PRN

## 2020-09-09 NOTE — Progress Notes (Signed)
Patient keep taking his Tele monitor off and refused his lab drown this morning, we will try again when he will up. Linton Flemings, NP notified.

## 2020-09-09 NOTE — TOC Transition Note (Addendum)
Transition of Care [facility]) - CM/SW Discharge Note   Patient Details  Name: James Greer MRN: [id number] Date of Birth: 1933/08/02  [facility] CM/SW Contact:  Marina Goodell Phone Number: [phone] 09/09/2020, 12:53 PM   Clinical Narrative:     Patient will d/c to [facility], [address], Report [phone], ask for [facility]  Attending and Unit RH updated.  Patient's daughter in law Carl Butner [phone] updated.  Paperwork in folder. Attending signature requested for DNR form. [facility] transport requested.         Patient Goals and CMS Choice        Discharge Placement                       Discharge Plan and Services                                     Social Determinants of Health (SDOH) Interventions     Readmission Risk Interventions No flowsheet data found.

## 2020-09-09 NOTE — Discharge Summary (Addendum)
Physician Discharge Summary  James Greer DOB: [birth year] DOA: 09/06/2020  PCP: James GammonGupta, Anjali L, MD  Admit date: 09/06/2020 Discharge date: 09/09/2020  Admitted From: ALF memory care Discharge disposition: Back to ALF memory care   Code Status: DNR  Diet Recommendation: Cardiac diet  Discharge Diagnosis:   Principal Problem:   AKI (acute kidney injury) (HCC) Active Problems:   Hyperlipidemia associated with type 2 diabetes mellitus (HCC)   CKD (chronic kidney disease), stage III (HCC)   Permanent atrial fibrillation (HCC)   Prolonged QT interval  Chief Complaint  Patient presents with  . Abnormal Lab   Brief narrative: James Greer is a 87 y.o. male with PMH significant for advanced dementia, DM 2, HLD, A. fib on Eliquis who lives in a memory care unit. Patient was sent to the ED on 4/7 for generalized weakness and abnormal blood work. Patient follows up with cardiologist Dr. Antoine PocheHochrein.  He was on Lasix for CHF.  About 2 weeks ago, Lasix was switched to torsemide after he started to have shortness of breath, weight gain. Labs done at the facility few days prior to this presentation showed showed elevated creatinine and hence patient was sent to the ED on 4/7.  In the ED, patient was bradycardic to 40s at rest, breathing on room air. Creatinine elevated to 2.57, BNP elevated 383, troponin elevated 141. Chest x-ray showed cardiomegaly and pulmonary vascular congestion without overt edema, suspected chronic moderate right layering pleural effusion. Ultrasound right upper quadrant showed markedly thickened and hydropic gallbladder wall suspicious for acute acalculous cholecystitis versus chronic gallbladder wall thickening related to intrinsic liver disease and/or hypoalbuminemia.  No biliary ductal dilatation.  It also showed possibility of hepatic cirrhosis with moderate ascites and moderate right pleural effusion. Patient was admitted to hospitalist service for  further evaluation and management. See below for details  Subjective: Patient was seen and examined this morning. Pleasantly demented.  Calm.  However for the last 2 nights, he was restless, pulled his IV line out twice.   Assessment/Plan: Acute exacerbation of chronic diastolic CHF -Patient presented with symptoms of volume overload as evidenced by shortness of breath, pedal edema, pleural effusion and ascites.   -4/7, echocardiogram showed EF of 60 to 65%, with severe LVH, moderately reduced RV systolic function, moderately enlarged RV size. -He was diuresed with few doses of IV Lasix 20 mg daily.  Respiratory status stable.  Volume status seems to be improving. -We will discharge him back today to ALF on Lasix 20 mg daily.  I have instructed to patient's daughter and the caregiver to be watchful of his symptoms and titrate the dose of Lasix based on CHF instructions -Patient needs a repeat BMP in next few days (prescription given) for renal function monitoring and titration of Lasix dose.  AKI on CKD 3a -Baseline creatinine 1.2-1.4.  Presented with a creatinine elevated to 2.57.   -Elevated creatinine likely because of congestive nephropathy.  With IV diuresis, creatinine is gradually improving but not back to baseline.     06/25/20 0527 06/26/20 0956 06/27/20 0550 07/06/20 1304 08/30/20 0000 09/06/20 1036 09/07/20 0839 09/08/20 0810 09/09/20 0823  BUN 36* 40* 37* 30* 37* 64* 72* 70* 66*  CREATININE 1.49* 1.50* 1.29* 1.39* 1.4* 2.57* 2.28* 1.91* 1.87*   Permanent A. fib Chronic bradycardia -Heart rate in 30s and 40s chronically.  Junctional rhythm in EKG.  Per history, he was seen by EP in the past and deemed a candidate for PPM due  to comorbidities -Continue Eliquis  Prolonged QT -QTc 511 ms.  Hold QT prolonging meds  Possible liver cirrhosis with portal hypertension Ascites -Ultrasound abdomen raised suspicion of liver cirrhosis.  Also showed moderate ascites and  pleural effusion.  Liver enzymes elevated. -Unclear etiology.  No history of alcoholism.  May be related to CHF as he has severe LVH, reduced RV function.  In any case, will need to manage his volume status.   -4/8, paracentesis was tried but could not be done because he did not have enough fluid to be removed. -Oral Lasix at discharge.  Stop statin. Recent Labs  Lab 09/06/20 1036  AST 52*  ALT 50*  ALKPHOS 138*  BILITOT 1.4*  PROT 7.3  ALBUMIN 3.7   Hyperlipidemia  -We will stop statin because of coexisting liver cirrhosis.  Type 2 diabetes Hyperglycemia -A1c 6.7 in 2021. -Not on meds. -Blood sugar level mostly normal.  Was low in 60s this morning because of poor intake.  Encourage oral appetite  Chronic pleural effusion -Respiratory status stable.  Dementia with behavioral disturbance -Cheerfully demented.  In the memory care unit at ALF. -Continue nightly melatonin.  Seroquel and trazodone were stopped in the past because of bradycardia. -Xanax 0.25 mg was given last night after which he rested well.  I would continue Xanax and melatonin nightly post discharge.   Wound care:    Discharge Exam:   Vitals:   09/08/20 0156 09/08/20 0555 09/08/20 1002 09/08/20 1359  BP: (!) 141/70 120/73 (!) 147/71 (!) 148/81  Pulse: (!) 36  (!) 32 (!) 37  Resp: 16 20 (!) 24 (!) 24  Temp: 99.2 F (37.3 C) (!) 97.2 F (36.2 C) (!) 97.4 F (36.3 C) 98.6 F (37 C)  TempSrc: Oral Axillary Oral Oral  SpO2: 95% 95% 93% 96%  Weight:      Height:        Body mass index is 23.37 kg/m.  General exam: Pleasant, elderly Caucasian male.  Not in physical distress.  Slightly confused this morning Skin: No rashes, lesions or ulcers. HEENT: Atraumatic, normocephalic, no obvious bleeding Lungs: Clear to auscultation bilaterally CVS: Chronic bradycardia in 30s GI/Abd soft, nontender, nondistended, bowel sound present CNS: Alert, awake, unable to answer yes/no questions.  Not oriented to place,  person or time today. Psychiatry: Mood appropriate Extremities: Pedal edema trace bilaterally, improving.  Follow ups:   Discharge Instructions    Diet - low sodium heart healthy   Complete by: As directed    Increase activity slowly   Complete by: As directed       Follow-up Information    James Gammon, MD Follow up.   Specialty: Internal Medicine Contact information: [address] [place] [address] [phone]        Rollene Rotunda, MD .   Specialty: Cardiology Contact information: [address] [place] [address] [place] [address] [phone]               Recommendations for Outpatient Follow-Up:   1. Follow-up with PCP as an outpatient 2. Follow-up with cardiologist as an outpatient  Discharge Instructions:  Follow with Primary MD James Gammon, MD in 7 days   Get CBC/BMP checked in next visit within 1 week by PCP or SNF MD ( we routinely change or add medications that can affect your baseline labs and fluid status, therefore we recommend that you get the mentioned basic workup next visit with your PCP, your PCP may decide not to get  them or add new tests based on their clinical decision)  On your next visit with your PCP, please Get Medicines reviewed and adjusted.  Please request your PCP  to go over all Hospital Tests and Procedure/Radiological results at the follow up, please get all Hospital records sent to your Prim MD by signing hospital release before you go home.  Activity: As tolerated with Full fall precautions use walker/cane & assistance as needed  For Heart failure patients - Check your Weight same time everyday, if you gain over 2 pounds, or you develop in leg swelling, experience more shortness of breath or chest pain, call your Primary MD immediately. Follow Cardiac Low Salt Diet and 1.5 lit/day fluid restriction.  If you have smoked or chewed Tobacco in the last 2 yrs please stop smoking, stop any regular Alcohol  and or any  Recreational drug use.  If you experience worsening of your admission symptoms, develop shortness of breath, life threatening emergency, suicidal or homicidal thoughts you must seek medical attention immediately by calling 911 or calling your MD immediately  if symptoms less severe.  You Must read complete instructions/literature along with all the possible adverse reactions/side effects for all the Medicines you take and that have been prescribed to you. Take any new Medicines after you have completely understood and accpet all the possible adverse reactions/side effects.   Do not drive, operate heavy machinery, perform activities at heights, swimming or participation in water activities or provide baby sitting services if your were admitted for syncope or siezures until you have seen by Primary MD or a Neurologist and advised to do so again.  Do not drive when taking Pain medications.  Do not take more than prescribed Pain, Sleep and Anxiety Medications  Wear Seat belts while driving.   Please note You were cared for by a hospitalist during your hospital stay. If you have any questions about your discharge medications or the care you received while you were in the hospital after you are discharged, you can call the unit and asked to speak with the hospitalist on call if the hospitalist that took care of you is not available. Once you are discharged, your primary care physician will handle any further medical issues. Please note that NO REFILLS for any discharge medications will be authorized once you are discharged, as it is imperative that you return to your primary care physician (or establish a relationship with a primary care physician if you do not have one) for your aftercare needs so that they can reassess your need for medications and monitor your lab values.    Allergies as of 09/09/2020   No Known Allergies     Medication List    STOP taking these medications   acetaminophen 500  MG tablet Commonly known as: TYLENOL   atorvastatin 40 MG tablet Commonly known as: LIPITOR     TAKE these medications   ALPRAZolam 0.25 MG tablet Commonly known as: XANAX Take 1 tablet (0.25 mg total) by mouth at bedtime as needed for up to 7 days for anxiety or sleep.   apixaban 5 MG Tabs tablet Commonly known as: ELIQUIS Take 1 tablet (5 mg total) by mouth 2 (two) times daily.   furosemide 20 MG tablet Commonly known as: Lasix 20 mg daily in the morning and an additional 20 mg for shortness of breath, worsening pedal edema, weight gain.   Glucerna Liqd Take 237 mLs by mouth 2 (two) times daily between meals.   hydroxypropyl  methylcellulose / hypromellose 2.5 % ophthalmic solution Commonly known as: ISOPTO TEARS / GONIOVISC Place 1 drop into both eyes 3 (three) times daily as needed for dry eyes.   melatonin 3 MG Tabs tablet Take 3 mg by mouth at bedtime.   MULTI-DAY PLUS MINERALS PO Take 1 tablet by mouth daily.  Iron-400 mcg-25 mcg   OVER THE COUNTER MEDICATION Take by mouth See admin instructions. 2 fiber thins with 8 ounces of water,juice or hot tea every morning.   OXYGEN Inhale 2 Greer into the lungs as directed. may titrate to 3L as needed to maintain oxygen saturation above 90%  Every Shift; Shift 1, Shift 2, Shift 3   sodium fluoride 1.1 % Crea dental cream Commonly known as: PREVIDENT 5000 PLUS Place 1 application onto teeth every evening.       Time coordinating discharge: 35 minutes  The results of significant diagnostics from this hospitalization (including imaging, microbiology, ancillary and laboratory) are listed below for reference.    Procedures and Diagnostic Studies:   DG Chest Portable 1 View  Result Date: 09/06/2020 CLINICAL DATA:  Weakness, fluid overload EXAM: PORTABLE CHEST 1 VIEW COMPARISON:  Prior chest x-ray 06/27/2020 FINDINGS: Stable cardiomegaly. Pulmonary vascular congestion bordering on mild edema. Chronic elevation of the right  hemidiaphragm with associated right middle and lower lobe atelectasis. No new focal infiltrate. Suspect chronic right layering pleural effusion. No pneumothorax. IMPRESSION: 1. Cardiomegaly and pulmonary vascular congestion without overt edema. 2. Suspect chronic moderate right layering pleural effusion. Electronically Signed   By: Malachy Moan M.D.   On: 09/06/2020 11:11   ECHOCARDIOGRAM COMPLETE  Result Date: 09/06/2020    ECHOCARDIOGRAM REPORT   Patient Name:   ALPER GUILMETTE Date of Exam: 09/06/2020 Medical Rec #:  [id number]        Height:       73.0 in Accession #:    [id number]       Weight:       182.0 lb Date of Birth:  Sep 30, 1933        BSA:          2.067 m Patient Age:    86 years         BP:           135/76 mmHg Patient Gender: M                HR:           38 bpm. Exam Location:  Inpatient Procedure: 2D Echo, Cardiac Doppler and Color Doppler Indications:    Dyspnea  History:        Patient has prior history of Echocardiogram examinations, most                 recent 05/22/2020. Arrythmias:Atrial Fibrillation,                 Signs/Symptoms:Dyspnea and LE edema, dementia; Risk                 Factors:Hypertension and Dyslipidemia.  Sonographer:    Lavenia Atlas Referring Phys: [id number] JARED E SEGAL  Sonographer Comments: Image acquisition challenging due to respiratory motion and bradycardia. IMPRESSIONS  1. See previous comments regarding LVH and r/o amyloidosis similar to echo 05/22/20 No strain imaging performed may be useful as well to look for decrease with apical sparing . Left ventricular ejection fraction, by estimation, is 60 to 65%. The left ventricle has normal function. The left ventricle has no regional wall motion  abnormalities. There is severe left ventricular hypertrophy. Left ventricular diastolic parameters were normal.  2. Right ventricular systolic function is moderately reduced. The right ventricular size is moderately enlarged. There is normal pulmonary artery  systolic pressure.  3. Left atrial size was moderately dilated.  4. Right atrial size was mildly dilated.  5. The mitral valve is normal in structure. Mild mitral valve regurgitation. No evidence of mitral stenosis.  6. The aortic valve is normal in structure. Aortic valve regurgitation is not visualized. No aortic stenosis is present.  7. The inferior vena cava is dilated in size with >50% respiratory variability, suggesting right atrial pressure of 8 mmHg. FINDINGS  Left Ventricle: See previous comments regarding LVH and r/o amyloidosis similar to echo 05/22/20 No strain imaging performed may be useful as well to look for decrease with apical sparing. Left ventricular ejection fraction, by estimation, is 60 to 65%.  The left ventricle has normal function. The left ventricle has no regional wall motion abnormalities. The left ventricular internal cavity size was normal in size. There is severe left ventricular hypertrophy. Left ventricular diastolic parameters were normal. Right Ventricle: The right ventricular size is moderately enlarged. No increase in right ventricular wall thickness. Right ventricular systolic function is moderately reduced. There is normal pulmonary artery systolic pressure. The tricuspid regurgitant velocity is 2.50 m/s, and with an assumed right atrial pressure of 3 mmHg, the estimated right ventricular systolic pressure is 28.0 mmHg. Left Atrium: Left atrial size was moderately dilated. Right Atrium: Right atrial size was mildly dilated. Pericardium: There is no evidence of pericardial effusion. Mitral Valve: The mitral valve is normal in structure. Mild mitral valve regurgitation. No evidence of mitral valve stenosis. Tricuspid Valve: The tricuspid valve is normal in structure. Tricuspid valve regurgitation is mild . No evidence of tricuspid stenosis. Aortic Valve: The aortic valve is normal in structure. Aortic valve regurgitation is not visualized. No aortic stenosis is present. Pulmonic  Valve: The pulmonic valve was normal in structure. Pulmonic valve regurgitation is not visualized. No evidence of pulmonic stenosis. Aorta: The aortic root is normal in size and structure. Venous: The inferior vena cava is dilated in size with greater than 50% respiratory variability, suggesting right atrial pressure of 8 mmHg. IAS/Shunts: No atrial level shunt detected by color flow Doppler.  LEFT VENTRICLE PLAX 2D LVIDd:         3.70 cm  Diastology LVIDs:         2.60 cm  LV e' medial:    4.79 cm/s LV PW:         2.10 cm  LV E/e' medial:  15.9 LV IVS:        1.70 cm  LV e' lateral:   5.44 cm/s LVOT diam:     2.10 cm  LV E/e' lateral: 14.0 LV SV:         46 LV SV Index:   22 LVOT Area:     3.46 cm  RIGHT VENTRICLE RV Basal diam:  4.20 cm RV S prime:     6.20 cm/s TAPSE (M-mode): 1.5 cm LEFT ATRIUM             Index       RIGHT ATRIUM           Index LA diam:        4.50 cm 2.18 cm/m  RA Area:     20.10 cm LA Vol (A2C):   82.5 ml 39.91 ml/m RA Volume:   59.50 ml  28.78 ml/m LA Vol (A4C):   78.9 ml 38.17 ml/m LA Biplane Vol: 83.7 ml 40.49 ml/m  AORTIC VALVE LVOT Vmax:   74.40 cm/s LVOT Vmean:  55.800 cm/s LVOT VTI:    0.133 m  AORTA Ao Root diam: 3.50 cm MITRAL VALVE               TRICUSPID VALVE MV Area (PHT): 5.66 cm    TR Peak grad:   25.0 mmHg MV Decel Time: 134 msec    TR Vmax:        250.00 cm/s MV E velocity: 76.30 cm/s MV A velocity: 43.30 cm/s  SHUNTS MV E/A ratio:  1.76        Systemic VTI:  0.13 m                            Systemic Diam: 2.10 cm Charlton Haws MD Electronically signed by Charlton Haws MD Signature Date/Time: 09/06/2020/3:23:00 PM    Final    [place] ASCITES (ABDOMEN LIMITED)  Result Date: 09/07/2020 CLINICAL DATA:  87 year old male with concern for ascites. EXAM: LIMITED ABDOMEN ULTRASOUND FOR ASCITES TECHNIQUE: Limited ultrasound survey for ascites was performed in all four abdominal quadrants. COMPARISON:  09/07/2020 FINDINGS: Trace perihepatic ascites. IMPRESSION: Trace perihepatic  ascites.  No safe window for paracentesis. Marliss Coots, MD [facility] Electronically Signed   By: Marliss Coots MD   On: 09/07/2020 16:39   US Abdomen Limited RUQ (LIVER/GB)  Result Date: 09/07/2020 CLINICAL DATA:  Abnormal LFTs EXAM: ULTRASOUND ABDOMEN LIMITED RIGHT UPPER QUADRANT COMPARISON:  Prior renal ultrasound 07/18/2018 FINDINGS: Gallbladder: No evidence of cholelithiasis. Markedly thickened and striated gallbladder wall measuring up to 1.3 cm in thickness. Common bile duct: Diameter: Normal at 5 mm Liver: No focal lesion identified. Within normal limits in parenchymal echogenicity. Moderate volume perihepatic ascites. Subtly nodular contour of the liver. Portal vein is patent on color Doppler imaging with normal direction of blood flow towards the liver. Other: Moderately large right pleural effusion. IMPRESSION: 1. Markedly thickened and hydropic gallbladder wall. Differential considerations include acute acalculous cholecystitis in the appropriate clinical setting versus chronic gallbladder wall thickening related to intrinsic liver disease and/or hypoalbuminemia. 2. Mildly nodular hepatic contour raises the possibility of hepatic cirrhosis. 3. Moderate ascites. 4. Moderate right pleural effusion. 5. No evidence of biliary ductal dilatation. Electronically Signed   By: Malachy Moan M.D.   On: 09/07/2020 06:42     Labs:   Basic Metabolic Panel: Recent Labs  Lab 09/06/20 1036 09/06/20 1323 09/07/20 0839 09/08/20 0810 09/09/20 0607 09/09/20 0823  NA 137  --  140 139 136 140  K 4.8  --  4.6 4.2 4.5 3.7  CL 98  --  100 101 97* 103  CO2 25  --  28 24 19* 27  GLUCOSE 116*  --  127* 136* 68* 119*  BUN 64*  --  72* 70* 47* 66*  CREATININE 2.57*  --  2.28* 1.91* 1.38* 1.87*  CALCIUM 9.4  --  9.5 9.4 8.7* 9.2  MG  --  2.5*  --   --   --   --    GFR Estimated Creatinine Clearance: 35.1 mL/min (A) (by C-G formula based on  SCr of 1.87 mg/dL (H)). Liver Function Tests: Recent Labs  Lab 09/06/20 1036  AST 52*  ALT 50*  ALKPHOS 138*  BILITOT 1.4*  PROT 7.3  ALBUMIN 3.7  No results for input(s): LIPASE, AMYLASE in the last 168 hours. No results for input(s): AMMONIA in the last 168 hours. Coagulation profile No results for input(s): INR, PROTIME in the last 168 hours.  CBC: Recent Labs  Lab 09/06/20 1036 09/07/20 0839 09/08/20 0810 09/09/20 0607 09/09/20 0812  WBC 7.5 7.1 6.2 16.7* 5.9  NEUTROABS 5.2  --   --   --  3.8  HGB 12.9* 12.8* 13.0 7.8* 12.7*  HCT 39.4 38.7* 39.9 25.8* 39.1  MCV 94.9 95.1 95.5 99.6 94.9  PLT 168 181 201 367 187   Cardiac Enzymes: No results for input(s): CKTOTAL, CKMB, CKMBINDEX, TROPONINI in the last 168 hours. BNP: Invalid input(s): POCBNP CBG: No results for input(s): GLUCAP in the last 168 hours. D-Dimer No results for input(s): DDIMER in the last 72 hours. Hgb A1c No results for input(s): HGBA1C in the last 72 hours. Lipid Profile No results for input(s): CHOL, HDL, LDLCALC, TRIG, CHOLHDL, LDLDIRECT in the last 72 hours. Thyroid function studies No results for input(s): TSH, T4TOTAL, T3FREE, THYROIDAB in the last 72 hours.  Invalid input(s): FREET3 Anemia work up No results for input(s): VITAMINB12, FOLATE, FERRITIN, TIBC, IRON, RETICCTPCT in the last 72 hours. Microbiology Recent Results (from the past 240 hour(s))  Resp Panel by RT-PCR (Flu A&B, Covid) Urine, Clean Catch     Status: None   Collection Time: 09/06/20  1:23 PM   Specimen: Urine, Clean Catch; Nasopharyngeal(NP) swabs in vial transport medium  Result Value Ref Range Status   SARS Coronavirus 2 by RT PCR NEGATIVE NEGATIVE Final    Comment: (NOTE) SARS-CoV-2 target nucleic acids are NOT DETECTED.  The SARS-CoV-2 RNA is generally detectable in upper respiratory specimens during the acute phase of infection. The lowest concentration of SARS-CoV-2 viral copies this assay can detect  is 138 copies/mL. A negative result does not preclude SARS-Cov-2 infection and should not be used as the sole basis for treatment or other patient management decisions. A negative result may occur with  improper specimen collection/handling, submission of specimen other than nasopharyngeal swab, presence of viral mutation(s) within the areas targeted by this assay, and inadequate number of viral copies(<138 copies/mL). A negative result must be combined with clinical observations, patient history, and epidemiological information. The expected result is Negative.  Fact Sheet for Patients:  BloggerCourse.com  Fact Sheet for Healthcare Providers:  SeriousBroker.it  This test is no t yet approved or cleared by the [place] FDA and  has been authorized for detection and/or diagnosis of SARS-CoV-2 by FDA under an Emergency Use Authorization (EUA). This EUA will remain  in effect (meaning this test can be used) for the duration of the COVID-19 declaration under Section 564(b)(1) of the Act, 21 U.S.C.section 360bbb-3(b)(1), unless the authorization is terminated  or revoked sooner.       Influenza A by PCR NEGATIVE NEGATIVE Final   Influenza B by PCR NEGATIVE NEGATIVE Final    Comment: (NOTE) The Xpert Xpress SARS-CoV-2/FLU/RSV plus assay is intended as an aid in the diagnosis of influenza from Nasopharyngeal swab specimens and should not be used as a sole basis for treatment. Nasal washings and aspirates are unacceptable for Xpert Xpress SARS-CoV-2/FLU/RSV testing.  Fact Sheet for Patients: BloggerCourse.com  Fact Sheet for Healthcare Providers: SeriousBroker.it  This test is not yet approved or cleared by the [place] FDA and has been authorized for detection and/or diagnosis of SARS-CoV-2 by FDA under an Emergency Use Authorization (EUA). This EUA will remain in effect  (  meaning this test can be used) for the duration of the COVID-19 declaration under Section 564(b)(1) of the Act, 21 U.S.C. section 360bbb-3(b)(1), unless the authorization is terminated or revoked.  Performed at [facility], [address] [place], [place] [address]      Signed: Melina Schools Sayde Lish  [facility] 09/09/2020, 11:37 AM

## 2020-09-10 ENCOUNTER — Encounter: Payer: Self-pay | Admitting: Internal Medicine

## 2020-09-10 ENCOUNTER — Non-Acute Institutional Stay (SKILLED_NURSING_FACILITY): Payer: Medicare HMO | Admitting: Internal Medicine

## 2020-09-10 DIAGNOSIS — R001 Bradycardia, unspecified: Secondary | ICD-10-CM

## 2020-09-10 DIAGNOSIS — I4821 Permanent atrial fibrillation: Secondary | ICD-10-CM

## 2020-09-10 DIAGNOSIS — G301 Alzheimer's disease with late onset: Secondary | ICD-10-CM

## 2020-09-10 DIAGNOSIS — E1169 Type 2 diabetes mellitus with other specified complication: Secondary | ICD-10-CM

## 2020-09-10 DIAGNOSIS — R9431 Abnormal electrocardiogram [ECG] [EKG]: Secondary | ICD-10-CM | POA: Diagnosis not present

## 2020-09-10 DIAGNOSIS — E785 Hyperlipidemia, unspecified: Secondary | ICD-10-CM

## 2020-09-10 DIAGNOSIS — I5032 Chronic diastolic (congestive) heart failure: Secondary | ICD-10-CM | POA: Diagnosis not present

## 2020-09-10 DIAGNOSIS — E1122 Type 2 diabetes mellitus with diabetic chronic kidney disease: Secondary | ICD-10-CM

## 2020-09-10 DIAGNOSIS — N289 Disorder of kidney and ureter, unspecified: Secondary | ICD-10-CM

## 2020-09-10 DIAGNOSIS — F0281 Dementia in other diseases classified elsewhere with behavioral disturbance: Secondary | ICD-10-CM

## 2020-09-10 DIAGNOSIS — F02818 Dementia in other diseases classified elsewhere, unspecified severity, with other behavioral disturbance: Secondary | ICD-10-CM

## 2020-09-10 DIAGNOSIS — N1831 Chronic kidney disease, stage 3a: Secondary | ICD-10-CM

## 2020-09-10 LAB — BASIC METABOLIC PANEL
BUN: 64 — AB (ref 4–21)
CO2: 25 — AB (ref 13–22)
Chloride: 101 (ref 99–108)
Creatinine: 1.6 — AB (ref 0.6–1.3)
Glucose: 133
Potassium: 4.1 (ref 3.4–5.3)
Sodium: 143 (ref 137–147)

## 2020-09-10 LAB — COMPREHENSIVE METABOLIC PANEL: Calcium: 9.6 (ref 8.7–10.7)

## 2020-09-10 NOTE — Progress Notes (Signed)
[facility] Room Number: 315 Place of Service:  SNF [phone]  Provider: Einar CrowGupta, Jaclynn Laumann MD  Code Status: DNR Goals of Care:  Advanced Directives 09/10/2020  Does Patient Have a Medical Advance Directive? Yes  Type of Estate agentAdvance Directive Healthcare Power of [place];Living will;Out of facility DNR (pink MOST or yellow form)  Does patient want to make changes to medical advance directive? No - Patient declined  Copy of Healthcare Power of Attorney in Chart? Yes - validated most recent copy scanned in chart (See row information)  Would patient like information on creating a medical advance directive? -  Pre-existing out of facility DNR order (yellow form or pink MOST form) Yellow form placed in chart (order not valid for inpatient use)     Chief Complaint  Patient presents with  . Readmit To SNF    Readmission to SNF    HPI: Patient is a 87 y.o. male seen today for an acute visit for readmission in memory unit.  Was admitted in the past from 04/7-04/10 for acute renal injury, bradycardia CHF  Patient has a history of type 2 diabetes, hyperlipidemia, CKD.  He also has history of A. fib on Eliquis.  But recently has been having episodes of bradycardia.  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  His creatinine went from 1.4-2.5 on high doses of Demadex. Patient was having shortness of breath and snacks was started on high dose of Demadex by cardiology.  Patient started having dizziness after few days the creatinine went up to 2.5. In the hospital his echocardiogram showed EF of 60 to 65% with severe LVH and moderately reduced RV function. He was watched closely on IV Lasix.Marland Kitchen. His renal function eventually improved Continues to  have bradycardia due to his amyloidosis.  His Seroquel and trazodone were stopped due to prolonged QT interval.  Has been using Xanax as needed for his behaviors and per nurses he is doing fine with that He also was found to have liver cirrhosis with moderate  Ascites His statin was stopped in the hospital  Patient continues to have shortness of breath on exertion per.  his caregiver.  No chest pain coughing.  Continues to have behavior issues.  Has a caregiver in the room    Past Medical History:  Diagnosis Date  . Carpal tunnel syndrome   . Chronic atrial fibrillation, unspecified (HCC)   . Chronic kidney disease, stage 3a (HCC)   . Degenerative disease of nervous system (HCC)   . Hyperlipidemia   . Localized edema   . Other thrombophilia (HCC)   . Rhinitis, allergic   . Toe fracture, left 05/27/2020   fracture of the proximal phalanx of the left first toe  . Trigger finger   . Type 2 diabetes mellitus (HCC)   . Unspecified convulsions (HCC)   . Unspecified dementia with behavioral disturbance (HCC)   . Urolithiasis     Past Surgical History:  Procedure Laterality Date  . CRANIOTOMY     with evacuation of subdural hematoma  . EXTRACORPOREAL SHOCK WAVE LITHOTRIPSY      No Known Allergies  Outpatient Encounter Medications as of 09/10/2020  Medication Sig  . ALPRAZolam (XANAX) 0.25 MG tablet Take 1 tablet (0.25 mg total) by mouth at bedtime as needed for  up to 7 days for anxiety or sleep.  Marland Kitchen apixaban (ELIQUIS) 5 MG TABS tablet Take 1 tablet (5 mg total) by mouth 2 (two) times daily.  . furosemide (LASIX) 20 MG tablet 20 mg daily in the morning and an additional 20 mg for shortness of breath, worsening pedal edema, weight gain.  . Glucerna (GLUCERNA) LIQD Take 237 mLs by mouth 2 (two) times daily between meals.  . hydroxypropyl methylcellulose / hypromellose (ISOPTO TEARS / GONIOVISC) 2.5 % ophthalmic solution Place 1 drop into both eyes 3 (three) times daily as needed for dry  eyes.  . melatonin 3 MG TABS tablet Take 3 mg by mouth at bedtime.  . Multiple Vitamins-Minerals (MULTI-DAY PLUS MINERALS PO) Take 1 tablet by mouth daily. 18mg  Iron-400 mcg-25 mcg  . OVER THE COUNTER MEDICATION Take by mouth See admin instructions. 2 fiber thins with 8 ounces of water,juice or hot tea every morning.  . OXYGEN Inhale 2 L into the lungs as directed. may titrate to 3L as needed to maintain oxygen saturation above 90%  Every Shift; Shift 1, Shift 2, Shift 3  . sodium fluoride (PREVIDENT 5000 PLUS) 1.1 % CREA dental cream Place 1 application onto teeth every evening.   No facility-administered encounter medications on file as of 09/10/2020.    Review of Systems:  Review of Systems  Unable to perform ROS: Dementia    Health Maintenance  Topic Date Due  . FOOT EXAM  Never done  . URINE MICROALBUMIN  Never done  . TETANUS/TDAP  Never done  . PNA vac Low Risk Adult (2 of 2 - PPSV23) 06/13/2010  . HEMOGLOBIN A1C  07/19/2020  . INFLUENZA VACCINE  12/31/2020  . OPHTHALMOLOGY EXAM  04/05/2021  . COVID-19 Vaccine  Completed  . HPV VACCINES  Aged Out    Physical Exam: Vitals:   09/10/20 1452  BP: (!) 154/78  Pulse: (!) 36  Resp: (!) 22  Temp: (!) 97.2 F (36.2 C)  SpO2: 96%  Weight: 183 lb 9.6 oz (83.3 kg)  Height: 6\' 1"  (1.854 m)   Body mass index is 24.22 kg/m. Physical Exam Constitutional:  Well-developed and well-nourished.  HENT:  Head: Normocephalic.  Mouth/Throat: Oropharynx is clear and moist.  Eyes: Pupils are equal, round, and reactive to light.  Neck: Neck supple.  Cardiovascular:Bradycardia and normal heart sounds.  No murmur heard. Pulmonary/Chest: Effort normal and breath sounds normal. No respiratory distress. No wheezes. He  has no rales.  Abdominal: Soft. Bowel sounds are normal. No distension. There is no tenderness. There is no rebound.  Musculoskeletal: Mild edema Lymphadenopathy: none Neurological: Alert Has Aphasia No Focal Deficits  but weak per Caregiver.  Skin: Skin is warm and dry.  Psychiatric: Normal mood and affect. Behavior is normal. Thought content normal.   Labs reviewed: Basic Metabolic Panel: Recent Labs    01/17/20 0000 01/23/20 0000 06/26/20 0956 06/27/20 0550 07/06/20 1304 09/06/20 1323 09/07/20 0839 09/08/20 0810 09/09/20 0607 09/09/20 0823 09/10/20 0000  NA  --    < > 145 147*   < >  --    < > 139 136 140 143  K  --    < > 3.0* 3.6   < >  --    < > 4.2 4.5 3.7 4.1  CL  --    < > 101 105   < >  --    < > 101 97* 103 101  CO2  --    < > 31 30   < >  --    < >  24 19* 27 25*  GLUCOSE  --    < > 115* 105*   < >  --    < > 136* 68* 119*  --   BUN  --    < > 40* 37*   < >  --    < > 70* 47* 66* 64*  CREATININE  --    < > 1.50* 1.29*   < >  --    < > 1.91* 1.38* 1.87* 1.6*  CALCIUM  --    < > 9.1 9.0   < >  --    < > 9.4 8.7* 9.2 9.6  MG  --   --  2.3 2.2  --  2.5*  --   --   --   --   --   PHOS  --   --  4.1 4.0  --   --   --   --   --   --   --   TSH 2.36  --   --   --   --   --   --   --   --   --   --    < > = values in this interval not displayed.   Liver Function Tests: Recent Labs    01/23/20 0000 06/24/20 1611 06/24/20 1611 06/26/20 0956 06/27/20 0550 09/06/20 1036  AST 27 42*  --   --   --  52*  ALT 23 35  --   --   --  50*  ALKPHOS  --  77  --   --   --  138*  BILITOT  --  2.0*  --   --   --  1.4*  PROT  --  7.7  --   --   --  7.3  ALBUMIN  --  4.0   < > 3.8 3.3* 3.7   < > = values in this interval not displayed.   No results for input(s): LIPASE, AMYLASE in the last 8760 hours. No results for input(s): AMMONIA in the last 8760 hours. CBC: Recent Labs    06/24/20 1611 09/06/20 1036 09/07/20 0839 09/08/20 0810 09/09/20 0607 09/09/20 0812  WBC 5.2 7.5   < > 6.2 16.7* 5.9  NEUTROABS 3.1 5.2  --   --   --  3.8  HGB 14.0 12.9*   < > 13.0 7.8* 12.7*  HCT 43.3 39.4   < > 39.9 25.8* 39.1  MCV 95.8 94.9   < > 95.5 99.6 94.9  PLT 203 168   < > 201 367 187   < > = values  in this interval not displayed.   Lipid Panel: No results for input(s): CHOL, HDL, LDLCALC, TRIG, CHOLHDL, LDLDIRECT in the last 8760 hours. Lab Results  Component Value Date   HGBA1C 6.7 01/17/2020    Procedures since last visit: DG Chest Portable 1 View  Result Date: 09/06/2020 CLINICAL DATA:  Weakness, fluid overload EXAM: PORTABLE CHEST 1 VIEW COMPARISON:  Prior chest x-ray 06/27/2020 FINDINGS: Stable cardiomegaly. Pulmonary vascular congestion bordering on mild edema. Chronic elevation of the right hemidiaphragm with associated right middle and lower lobe atelectasis. No new focal infiltrate. Suspect chronic right layering pleural effusion. No pneumothorax. IMPRESSION: 1. Cardiomegaly and pulmonary vascular congestion without overt edema. 2. Suspect chronic moderate right layering pleural effusion. Electronically Signed   By: Malachy Moan M.D.   On: 09/06/2020 11:11   ECHOCARDIOGRAM COMPLETE  Result Date: 09/06/2020  ECHOCARDIOGRAM REPORT   Patient Name:   James Greer Date of Exam: 09/06/2020 Medical Rec #:  [id number]        Height:       73.0 in Accession #:    [id number]       Weight:       182.0 lb Date of Birth:  Jan 02, 1934        BSA:          2.067 m Patient Age:    86 years         BP:           135/76 mmHg Patient Gender: M                HR:           38 bpm. Exam Location:  Inpatient Procedure: 2D Echo, Cardiac Doppler and Color Doppler Indications:    Dyspnea  History:        Patient has prior history of Echocardiogram examinations, most                 recent 05/22/2020. Arrythmias:Atrial Fibrillation,                 Signs/Symptoms:Dyspnea and LE edema, dementia; Risk                 Factors:Hypertension and Dyslipidemia.  Sonographer:    Lavenia Atlas Referring Phys: [id number] JARED E SEGAL  Sonographer Comments: Image acquisition challenging due to respiratory motion and bradycardia. IMPRESSIONS  1. See previous comments regarding LVH and r/o amyloidosis similar to echo  05/22/20 No strain imaging performed may be useful as well to look for decrease with apical sparing . Left ventricular ejection fraction, by estimation, is 60 to 65%. The left ventricle has normal function. The left ventricle has no regional wall motion abnormalities. There is severe left ventricular hypertrophy. Left ventricular diastolic parameters were normal.  2. Right ventricular systolic function is moderately reduced. The right ventricular size is moderately enlarged. There is normal pulmonary artery systolic pressure.  3. Left atrial size was moderately dilated.  4. Right atrial size was mildly dilated.  5. The mitral valve is normal in structure. Mild mitral valve regurgitation. No evidence of mitral stenosis.  6. The aortic valve is normal in structure. Aortic valve regurgitation is not visualized. No aortic stenosis is present.  7. The inferior vena cava is dilated in size with >50% respiratory variability, suggesting right atrial pressure of 8 mmHg. FINDINGS  Left Ventricle: See previous comments regarding LVH and r/o amyloidosis similar to echo 05/22/20 No strain imaging performed may be useful as well to look for decrease with apical sparing. Left ventricular ejection fraction, by estimation, is 60 to 65%.  The left ventricle has normal function. The left ventricle has no regional wall motion abnormalities. The left ventricular internal cavity size was normal in size. There is severe left ventricular hypertrophy. Left ventricular diastolic parameters were normal. Right Ventricle: The right ventricular size is moderately enlarged. No increase in right ventricular wall thickness. Right ventricular systolic function is moderately reduced. There is normal pulmonary artery systolic pressure. The tricuspid regurgitant velocity is 2.50 m/s, and with an assumed right atrial pressure of 3 mmHg, the estimated right ventricular systolic pressure is 28.0 mmHg. Left Atrium: Left atrial size was moderately dilated.  Right Atrium: Right atrial size was mildly dilated. Pericardium: There is no evidence of pericardial effusion. Mitral Valve: The mitral valve is normal in structure. Mild mitral  valve regurgitation. No evidence of mitral valve stenosis. Tricuspid Valve: The tricuspid valve is normal in structure. Tricuspid valve regurgitation is mild . No evidence of tricuspid stenosis. Aortic Valve: The aortic valve is normal in structure. Aortic valve regurgitation is not visualized. No aortic stenosis is present. Pulmonic Valve: The pulmonic valve was normal in structure. Pulmonic valve regurgitation is not visualized. No evidence of pulmonic stenosis. Aorta: The aortic root is normal in size and structure. Venous: The inferior vena cava is dilated in size with greater than 50% respiratory variability, suggesting right atrial pressure of 8 mmHg. IAS/Shunts: No atrial level shunt detected by color flow Doppler.  LEFT VENTRICLE PLAX 2D LVIDd:         3.70 cm  Diastology LVIDs:         2.60 cm  LV e' medial:    4.79 cm/s LV PW:         2.10 cm  LV E/e' medial:  15.9 LV IVS:        1.70 cm  LV e' lateral:   5.44 cm/s LVOT diam:     2.10 cm  LV E/e' lateral: 14.0 LV SV:         46 LV SV Index:   22 LVOT Area:     3.46 cm  RIGHT VENTRICLE RV Basal diam:  4.20 cm RV S prime:     6.20 cm/s TAPSE (M-mode): 1.5 cm LEFT ATRIUM             Index       RIGHT ATRIUM           Index LA diam:        4.50 cm 2.18 cm/m  RA Area:     20.10 cm LA Vol (A2C):   82.5 ml 39.91 ml/m RA Volume:   59.50 ml  28.78 ml/m LA Vol (A4C):   78.9 ml 38.17 ml/m LA Biplane Vol: 83.7 ml 40.49 ml/m  AORTIC VALVE LVOT Vmax:   74.40 cm/s LVOT Vmean:  55.800 cm/s LVOT VTI:    0.133 m  AORTA Ao Root diam: 3.50 cm MITRAL VALVE               TRICUSPID VALVE MV Area (PHT): 5.66 cm    TR Peak grad:   25.0 mmHg MV Decel Time: 134 msec    TR Vmax:        250.00 cm/s MV E velocity: 76.30 cm/s MV A velocity: 43.30 cm/s  SHUNTS MV E/A ratio:  1.76        Systemic VTI:   0.13 m                            Systemic Diam: 2.10 cm Charlton Haws MD Electronically signed by Charlton Haws MD Signature Date/Time: 09/06/2020/3:23:00 PM    Final    [place] ASCITES (ABDOMEN LIMITED)  Result Date: 09/07/2020 CLINICAL DATA:  87 year old male with concern for ascites. EXAM: LIMITED ABDOMEN ULTRASOUND FOR ASCITES TECHNIQUE: Limited ultrasound survey for ascites was performed in all four abdominal quadrants. COMPARISON:  09/07/2020 FINDINGS: Trace perihepatic ascites. IMPRESSION: Trace perihepatic ascites.  No safe window for paracentesis. Marliss Coots, MD [facility] Electronically Signed   By: Marliss Coots MD   On: 09/07/2020 16:39   US Abdomen Limited RUQ (LIVER/GB)  Result Date: 09/07/2020 CLINICAL DATA:  Abnormal LFTs EXAM: ULTRASOUND ABDOMEN LIMITED RIGHT UPPER QUADRANT COMPARISON:  Prior renal  ultrasound 07/18/2018 FINDINGS: Gallbladder: No evidence of cholelithiasis. Markedly thickened and striated gallbladder wall measuring up to 1.3 cm in thickness. Common bile duct: Diameter: Normal at 5 mm Liver: No focal lesion identified. Within normal limits in parenchymal echogenicity. Moderate volume perihepatic ascites. Subtly nodular contour of the liver. Portal vein is patent on color Doppler imaging with normal direction of blood flow towards the liver. Other: Moderately large right pleural effusion. IMPRESSION: 1. Markedly thickened and hydropic gallbladder wall. Differential considerations include acute acalculous cholecystitis in the appropriate clinical setting versus chronic gallbladder wall thickening related to intrinsic liver disease and/or hypoalbuminemia. 2. Mildly nodular hepatic contour raises the possibility of hepatic cirrhosis. 3. Moderate ascites. 4. Moderate right pleural effusion. 5. No evidence of biliary ductal dilatation. Electronically Signed   By: Malachy Moan M.D.   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

## 2020-09-11 NOTE — Telephone Encounter (Signed)
ERROR

## 2020-09-12 ENCOUNTER — Telehealth: Payer: Self-pay | Admitting: Cardiology

## 2020-09-12 NOTE — Telephone Encounter (Signed)
Pt c/o Shortness Of Breath: STAT if SOB developed within the last 24 hours or pt is noticeably SOB on the phone  1. Are you currently SOB (can you hear that pt is SOB on the phone)? Unable to access because pt is in Memory care in [place], pt was discharged from the hospital on 09/09/20 with SOB. Daughter in law is calling from [facility] and pt is in [facility] in [place]  2. How long have you been experiencing SOB? Since pt was admitted into [facility] on 09/06/20  3. Are you SOB when sitting or when up moving around?  Sitting per Daughter in law  4. Are you currently experiencing any other symptoms? No she doesn't think so.

## 2020-09-12 NOTE — Telephone Encounter (Signed)
Returned call to daughter (ok per DPR)-she reports patient had recent admission to hospital 4/7 due to dehydration from torsemide.   She reports he was noted to have pleural effusions, ascites and cirrhosis.   They did not do a paracentesis as there was not enough fluid to drain.   She states they did not do a thoracentesis as they were told the fluid would just come right back.   He has CHF, afib, bradycardia, dimentia, possible cardiac amyloidosis.   States he was given IV lasix, kidney function improved.  He was discharged to [facility] unit.  She states patient continues to be SOB at rest, possible worse than at discharge.   Nurse administered PRN dose of lasix today to see if this helps.   Daughter is unaware of any change in swelling in LE or abdomen.  She reports his O2 level is above 90 %.     Daughter reports she is unsure what to do from here or the plan of care for patient.  Discharge instructions state to follow up with cardiology asap.  Scheduled with Dr. Rennis Bargo (DOD) tomorrow 4/14 to assess.  Daughter in law aware and verbalized understanding.  They will come early with his caregiver and bring updated current list of medications.    Daughter in law also aware if patient has any acute SOB or change/increase in symptoms to proceed to ER for evaluation, she verbalized understanding.

## 2020-09-13 ENCOUNTER — Ambulatory Visit (INDEPENDENT_AMBULATORY_CARE_PROVIDER_SITE_OTHER): Payer: Medicare HMO | Admitting: Internal Medicine

## 2020-09-13 ENCOUNTER — Other Ambulatory Visit: Payer: Self-pay

## 2020-09-13 DIAGNOSIS — I5032 Chronic diastolic (congestive) heart failure: Secondary | ICD-10-CM | POA: Diagnosis not present

## 2020-09-13 DIAGNOSIS — N1831 Chronic kidney disease, stage 3a: Secondary | ICD-10-CM

## 2020-09-13 DIAGNOSIS — R001 Bradycardia, unspecified: Secondary | ICD-10-CM

## 2020-09-13 DIAGNOSIS — I4821 Permanent atrial fibrillation: Secondary | ICD-10-CM | POA: Diagnosis not present

## 2020-09-13 DIAGNOSIS — F0281 Dementia in other diseases classified elsewhere with behavioral disturbance: Secondary | ICD-10-CM

## 2020-09-13 DIAGNOSIS — G301 Alzheimer's disease with late onset: Secondary | ICD-10-CM

## 2020-09-13 DIAGNOSIS — F02818 Dementia in other diseases classified elsewhere, unspecified severity, with other behavioral disturbance: Secondary | ICD-10-CM

## 2020-09-13 MED ORDER — FUROSEMIDE 20 MG PO TABS: 20.0000 mg | ORAL_TABLET | Freq: Two times a day (BID) | ORAL | 3 refills | Status: AC

## 2020-09-13 NOTE — Progress Notes (Signed)
OFFICE NOTE  Chief Complaint:  Shortness of breath  Primary Care Physician: Mahlon Gammon, MD  HPI:  James Greer is a 87 y.o. male with a past medial history significant for chronic atrial fibrillation, stage III chronic kidney disease, advanced dementia, dyslipidemia, type 2 diabetes, and congestive heart failure with recent admission for acute kidney injury probably related to increases in diuretics.  James Greer is  a patient of Dr. Antoine Poche who was seen today as an add-on visit for progressive shortness of breath.  He was as mentioned recently hospitalized for acute kidney injury after progressive shortness of breath and leg swelling.  His Lasix was changed to torsemide but ultimately everything was discontinued and then he was restarted on low-dose Lasix at discharge.  He resides at a memory care facility.  Since that time he has had notably worsening shortness of breath and lower extremity edema.  He was referred for further evaluation today.  James Greer denies any chest pain.  He does have some shortness of breath.  There has been some visual respiratory difficulty.  PMHx:  Past Medical History:  Diagnosis Date  . Carpal tunnel syndrome   . Chronic atrial fibrillation, unspecified (HCC)   . Chronic kidney disease, stage 3a (HCC)   . Degenerative disease of nervous system (HCC)   . Hyperlipidemia   . Localized edema   . Other thrombophilia (HCC)   . Rhinitis, allergic   . Toe fracture, left 05/27/2020   fracture of the proximal phalanx of the left first toe  . Trigger finger   . Type 2 diabetes mellitus (HCC)   . Unspecified convulsions (HCC)   . Unspecified dementia with behavioral disturbance (HCC)   . Urolithiasis     Past Surgical History:  Procedure Laterality Date  . CRANIOTOMY     with evacuation of subdural hematoma  . EXTRACORPOREAL SHOCK WAVE LITHOTRIPSY      FAMHx:  Family History  Problem Relation Age of Onset  . Aneurysm Sister     SOCHx:    reports that he has never smoked. He has never used smokeless tobacco. He reports previous alcohol use. He reports previous drug use.  ALLERGIES:  No Known Allergies  ROS: Pertinent items noted in HPI and remainder of comprehensive ROS otherwise negative.  HOME MEDS: Current Outpatient Medications on File Prior to Visit  Medication Sig Dispense Refill  . ALPRAZolam (XANAX) 0.25 MG tablet Take 1 tablet (0.25 mg total) by mouth at bedtime as needed for up to 7 days for anxiety or sleep. 7 tablet 0  . apixaban (ELIQUIS) 5 MG TABS tablet Take 1 tablet (5 mg total) by mouth 2 (two) times daily. 60 tablet 3  . Glucerna (GLUCERNA) LIQD Take 237 mLs by mouth 2 (two) times daily between meals.    . hydroxypropyl methylcellulose / hypromellose (ISOPTO TEARS / GONIOVISC) 2.5 % ophthalmic solution Place 1 drop into both eyes 3 (three) times daily as needed for dry eyes.    . melatonin 3 MG TABS tablet Take 3 mg by mouth at bedtime.    . Multiple Vitamins-Minerals (MULTI-DAY PLUS MINERALS PO) Take 1 tablet by mouth daily. 18mg  Iron-400 mcg-25 mcg    . OXYGEN Inhale 2 L into the lungs as directed. may titrate to 3L as needed to maintain oxygen saturation above 90%  Every Shift; Shift 1, Shift 2, Shift 3    . sodium fluoride (PREVIDENT 5000 PLUS) 1.1 % CREA dental cream Place 1 application onto teeth  every evening.    Marland Kitchen OVER THE COUNTER MEDICATION Take by mouth See admin instructions. 2 fiber thins with 8 ounces of water,juice or hot tea every morning.     No current facility-administered medications on file prior to visit.    LABS/IMAGING: No results found for this or any previous visit (from the past 48 hour(s)). No results found.  LIPID PANEL: No results found for: CHOL, TRIG, HDL, CHOLHDL, VLDL, LDLCALC, LDLDIRECT   WEIGHTS: Wt Readings from Last 3 Encounters:  09/10/20 183 lb 9.6 oz (83.3 kg)  09/07/20 186 lb 15.2 oz (84.8 kg)  09/05/20 182 lb (82.6 kg)    VITALS: There were no vitals  taken for this visit.  EXAM: General appearance: alert, no distress and In a wheelchair Neck: JVD - 4 cm above sternal notch, no carotid bruit and thyroid not enlarged, symmetric, no tenderness/mass/nodules Lungs: dullness to percussion RLL and RML Heart: irregularly irregular rhythm Abdomen: soft, non-tender; bowel sounds normal; no masses,  no organomegaly Extremities: extremities normal, atraumatic, no cyanosis or edema Pulses: 2+ and symmetric Skin: Skin color, texture, turgor normal. No rashes or lesions Neurologic: Grossly normal Psychj: Pleasant  Examination was chaperoned by Julaine Fusi, RN.   EKG: Idioventricular rhythm at 39, right bundle branch block, inferior infarct pattern and lateral T wave changes- personally reviewed  ASSESSMENT: 1. Chronic diastolic heart failure, LVEF 60 to 65% 2. Marked bradycardia-not a pacemaker candidate due to dementia 3. Stage IIIa chronic kidney disease-with recent AKI 4. Pleural effusion and ascites 5. Atrial fibrillation 6. Dementia, Alzheimer's type with behavioral disturbance  PLAN: 1.   James Greer is seen today after being hospitalized with recent AKI in the setting of increased diuretics.  He now appears to be recruiting fluid again. His pleural effusions appears about half of the right lung.  He is also struggled with some ascites and lower extremity edema.  I advised he could further increase his Lasix from 20 mg in the a.m. to 20 in the morning and 20 every afternoon.  I really think that they would benefit from palliative care.  I spoke with the daughter over the phone and the patient as well about this today and they seemed agreeable to discuss this with them.  He was turned down for pacemaker and I think her options are limited.  Follow-up with Dr. Antoine Poche.  Chrystie Nose, MD, [facility], FACP  [place]  [facility]  Medical Director of the Advanced Lipid Disorders &  Cardiovascular Risk Reduction Clinic Diplomate of the  American Board of Clinical Lipidology Attending Cardiologist  Direct Dial: [phone]  Fax: [phone]  Website:  [email or web address] 09/13/2020, 10:32 PM

## 2020-09-13 NOTE — Patient Instructions (Addendum)
Medication Instructions:   Increase Lasix to 20mg  twice daily.   *If you need a refill on your cardiac medications before your next appointment, please call your pharmacy*   Lab Work: None ordered  If you have labs (blood work) drawn today and your tests are completely normal, you will receive your results only by: MyChart Message (if you have MyChart) OR . A paper copy in the mail If you have any lab test that is abnormal or we need to change your treatment, we will call you to review the results.   Testing/Procedures: None ordered.    Follow-Up: At [facility], you and your health needs are our priority.  As part of our continuing mission to provide you with exceptional heart care, we have created designated Provider Care Teams.  These Care Teams include your primary Cardiologist (physician) and Advanced Practice Providers (APPs -  Physician Assistants and Nurse Practitioners) who all work together to provide you with the care you need, when you need it.  We recommend signing up for the patient portal called "MyChart".  Sign up information is provided on this After Visit Summary.  MyChart is used to connect with patients for Virtual Visits (Telemedicine).  Patients are able to view lab/test results, encounter notes, upcoming appointments, etc.  Non-urgent messages can be sent to your provider as well.   To learn more about what you can do with MyChart, go to [facility].    Your next appointment:   Keep your next appointment with [email or web address] on 5/24.  Dr. 6/24 has placed a palliative care referral through [facility].

## 2020-09-17 LAB — BASIC METABOLIC PANEL
BUN: 53 — AB (ref 4–21)
CO2: 25 — AB (ref 13–22)
Chloride: 102 (ref 99–108)
Creatinine: 1.6 — AB (ref 0.6–1.3)
Glucose: 115
Potassium: 4.7 (ref 3.4–5.3)
Sodium: 142 (ref 137–147)

## 2020-09-17 LAB — CBC

## 2020-09-17 LAB — COMPREHENSIVE METABOLIC PANEL: Calcium: 9.8 (ref 8.7–10.7)

## 2020-09-17 LAB — HEMOGLOBIN A1C: Hemoglobin A1C: 6.6

## 2020-09-20 ENCOUNTER — Other Ambulatory Visit: Payer: Self-pay

## 2020-09-20 ENCOUNTER — Non-Acute Institutional Stay: Payer: Medicare HMO | Admitting: Nurse Practitioner

## 2020-09-20 DIAGNOSIS — R600 Localized edema: Secondary | ICD-10-CM

## 2020-09-20 DIAGNOSIS — Z515 Encounter for palliative care: Secondary | ICD-10-CM

## 2020-09-20 NOTE — Progress Notes (Signed)
James Greer Consult Note Telephone: [phone]  Fax: [phone]    Date of encounter: 09/20/20 PATIENT NAME: James Greer [address] [place] [address]   [phone] (home)  DOB: [birth year] MRN: [id number]   PRIMARY CARE PROVIDER:    Virgie Dad, MD,  [place] [address] [phone]  REFERRING PROVIDER:   Virgie Dad, MD [address] [place],  [place] [address] [phone]  RESPONSIBLE PARTY:    Contact Information    Name Relation Home Work Amelia, Kennyth Lose Relative [phone]     Mattis, Featherly [phone]  [phone]      I met face to face with patient in facility. Palliative Care was asked to follow this patient by consultation request of  Virgie Dad, MD to address advance care planning and complex medical decision making. This is the initial visit.                                    ASSESSMENT AND PLAN / RECOMMENDATIONS:   Advance Care Planning/Goals of Care: Goals include to maximize quality of life and symptom management. Advance care discussion held over the phone with patient's daughter in-law Kennyth Lose. Our advance care planning conversation included a discussion about:     The value and importance of advance care planning   Today's discussion consisted of building trust and discussions on Palliative care medicine as a specialized medical care for people living with serious illness, aimed at facilitating improved quality of life through symptoms relief, assisting with advance care planning and establishing goals of care. Kennyth Lose expressed appreciation for education provided on Palliative care and how it differs from Hospice service.  Palliative care will continue to provide support to patient, family and the medical team. Goal of care: Goal of care is comfort while preserving function. Directives: Patient has a signed DNR on file in the facility,  copy on [facility] EMR  Visit update discussed with Kennyth Lose. Family made aware of limited options to control patient's lower extremity edema, discussed the need to focus on comfort for patient at this time.The difference between aggressive medical intervention and comfort care was considered in light of the patient's goals of care. Kennyth Lose report family considering seeking second opinion at [facility].  Discussion also on on disease trajectory of dementia, as it is progressive and terminal and likely to eventually lead to dysphagia, weight loss and immobility. Emotional and supportive care provided. Questions and concerns were addressed. Family was encouraged to call with questions and/or concerns. Provided general support and encouragement.  I spent 50 minutes providing this consultation. More than 50% of the time in this consultation was spent in counseling and care coordination. ------------------------------------------------------------------------------  Symptom Management/Plan: Bilateral lower extremity edema; Continue Lasix 37m twice a day. Continue supportive care with use of compression stockings, encourage elevating legs when sitting to promote venous return. Dyspnea with activity: encourage pacing activities. May consider using wheelchair for long distance ambulation to preserve energy. If worsening dyspnea related to fluid overload, consider short course of increased dose of Lasix or addition of short course of low dose Metolazone. Monitor renal function. Dementia: no report of behavior concerns at this time. Continue supportive care.   Follow up Palliative Care Visit: Palliative care will continue to follow for complex medical decision making, advance care planning, and clarification of  goals. Return in about 4 weeks or prn.  This visit was coded based on medical decision making (MDM).  PPS: 50% weak  HOSPICE ELIGIBILITY/DIAGNOSIS: TBD  CHIEF COMPLAIN: bilateral lower  extremity edema  HISTORY OF PRESENT ILLNESS:  James Greer is a 87 y.o. year old male  with medical problems significant for bilateral lower extremity edema. Bilateral lower extremity in the context of heart failure is ongoing and severe. Condition is complicated by poor renal function ( Cr 1.6, GFR 35) and bradycardia. Patient is followed by Cardiology Dr. Debara Pickett, there is challenge controlling fluid balance due to symptomatic bradycardia, report limited option for patient condition. Patient deemed not a good surgical candidate for pacemaker placement related to his advance dementia. Patient denied chest pain today. No SOB noted, respiration even and regular. Caregiver James Greer report some SOB with activity like walking. No report of acute cough, no report of fever or chills, no report of uncontrolled pain. Patient other medical problems include Afib, Alzheimer's dementia (FAST 6c). History obtained from review of Epic EMR, discussion with facility staff and interview with family and personal caregiver.  I reviewed available labs, medications, imaging, studies and related documents from the Epic EMR.  Records reviewed and summarized above.   ROS Limited ROS due to poor cognition related advance dementia. Pertinent ROS from staff noted in HPI  Physical Exam: Constitutional: NAD General: frail appearing, cooperative, sitting in chair finishing lunch, NAD  EYES: anicteric sclera, no discharge  ENMT: intact hearing, oral mucous membranes moist CV: S1S2, +2 LE edema Pulmonary: LCTA, no increased work of breathing, no cough, room air Abdomen: intake 50-75%, no ascites GU: deferred MSK: no sarcopenia, moves all extremities, ambulatory Skin: warm and dry, no rashes or wounds on visible skin Neuro: generalized weakness, moderate to severe cognitive impairment Psych: non-anxious affect, A and O x 1 Hem/lymph/immuno: no widespread bruising  CURRENT PROBLEM LIST:  Patient Active Problem List    Diagnosis Date Noted  . AKI (acute kidney injury) ([place]) 09/06/2020  . Prolonged QT interval 09/06/2020  . Essential hypertension 07/16/2020  . Acute on chronic diastolic CHF (congestive heart failure) ([place]) 06/24/2020  . Bradycardia 06/24/2020  . Right renal stone 06/07/2020  . Permanent atrial fibrillation ([place]) 05/29/2020  . CKD (chronic kidney disease), stage III ([place]) 05/08/2020  . Dementia ([place]) 02/28/2020  . Subdural hematoma ([place]) 02/28/2020  . Alcohol dependence ([place]) 02/28/2020  . Paroxysmal A-fib ([place]) 02/28/2020  . Secondary hypercoagulable state ([place]) 02/28/2020  . Right bundle branch block (RBBB) 02/28/2020  . Bilateral hearing loss 02/28/2020  . Right carpal tunnel syndrome 02/28/2020  . Hyperlipidemia associated with type 2 diabetes mellitus ([place]) 02/28/2020  . Type 2 diabetes mellitus with diabetic chronic kidney disease ([place]) 02/28/2020  . Benign prostatic hyperplasia with urinary frequency 02/28/2020   PAST MEDICAL HISTORY:  Active Ambulatory Problems    Diagnosis Date Noted  . Dementia ([place]) 02/28/2020  . Subdural hematoma ([place]) 02/28/2020  . Alcohol dependence ([place]) 02/28/2020  . Paroxysmal A-fib ([place]) 02/28/2020  . Secondary hypercoagulable state ([place]) 02/28/2020  . Right bundle branch block (RBBB) 02/28/2020  . Bilateral hearing loss 02/28/2020  . Right carpal tunnel syndrome 02/28/2020  . Hyperlipidemia associated with type 2 diabetes mellitus ([place]) 02/28/2020  . Type 2 diabetes mellitus with diabetic chronic kidney disease ([place]) 02/28/2020  . Benign prostatic hyperplasia with urinary frequency 02/28/2020  . CKD (chronic kidney disease), stage III ([place]) 05/08/2020  . Permanent atrial fibrillation ([place]) 05/29/2020  . Right renal stone 06/07/2020  .  Acute on chronic diastolic CHF (congestive heart failure) ([place]) 06/24/2020  . Bradycardia 06/24/2020  . Essential hypertension 07/16/2020  . AKI (acute kidney injury) ([place]) 09/06/2020  . Prolonged QT interval  09/06/2020   Resolved Ambulatory Problems    Diagnosis Date Noted  . Displaced fracture of distal phalanx of left great toe, initial encounter for closed fracture 06/14/2020   Past Medical History:  Diagnosis Date  . Carpal tunnel syndrome   . Chronic atrial fibrillation, unspecified ([place])   . Chronic kidney disease, stage 3a ([place])   . Degenerative disease of nervous system ([place])   . Hyperlipidemia   . Localized edema   . Other thrombophilia ([place])   . Rhinitis, allergic   . Toe fracture, left 05/27/2020  . Trigger finger   . Type 2 diabetes mellitus ([place])   . Unspecified convulsions ([place])   . Unspecified dementia with behavioral disturbance ([place])   . Urolithiasis    SOCIAL HX:  Social History   Tobacco Use  . Smoking status: Never Smoker  . Smokeless tobacco: Never Used  Substance Use Topics  . Alcohol use: Not Currently   FAMILY HX:  Family History  Problem Relation Age of Onset  . Aneurysm Sister      ALLERGIES: No Known Allergies   PERTINENT MEDICATIONS:  Outpatient Encounter Medications as of 09/20/2020  Medication Sig  . apixaban (ELIQUIS) 5 MG TABS tablet Take 1 tablet (5 mg total) by mouth 2 (two) times daily.  . furosemide (LASIX) 20 MG tablet Take 1 tablet (20 mg total) by mouth 2 (two) times daily.  . Glucerna (GLUCERNA) LIQD Take 237 mLs by mouth 2 (two) times daily between meals.  . hydroxypropyl methylcellulose / hypromellose (ISOPTO TEARS / GONIOVISC) 2.5 % ophthalmic solution Place 1 drop into both eyes 3 (three) times daily as needed for dry eyes.  . melatonin 3 MG TABS tablet Take 3 mg by mouth at bedtime.  . Multiple Vitamins-Minerals (MULTI-DAY PLUS MINERALS PO) Take 1 tablet by mouth daily. 55m Iron-400 mcg-25 mcg  . OVER THE COUNTER MEDICATION Take by mouth See admin instructions. 2 fiber thins with 8 ounces of water,juice or hot tea every morning.  . OXYGEN Inhale 2 L into the lungs as directed. may titrate to 3L as needed to maintain oxygen  saturation above 90%  Every Shift; Shift 1, Shift 2, Shift 3  . sodium fluoride (PREVIDENT 5000 PLUS) 1.1 % CREA dental cream Place 1 application onto teeth every evening.   No facility-administered encounter medications on file as of 09/20/2020.    Thank you for the opportunity to participate in the care of Mr. GEnochs  The palliative care team will continue to follow. Please call our office at [phone] we can be of additional assistance.   QJari Favre DNP, AGPCNP-BC  COVID-19 PATIENT SCREENING TOOL Asked and negative response unless otherwise noted:   Have you had symptoms of covid, tested positive or been in contact with someone with symptoms/positive test in the past 5-10 days?

## 2020-09-21 ENCOUNTER — Telehealth: Payer: Self-pay | Admitting: Adult Health

## 2020-09-21 DIAGNOSIS — R0602 Shortness of breath: Secondary | ICD-10-CM

## 2020-09-21 MED ORDER — MORPHINE SULFATE (CONCENTRATE) 20 MG/ML PO SOLN: 5.0000 mg | ORAL | 0 refills | Status: AC | PRN

## 2020-09-21 NOTE — Telephone Encounter (Signed)
Nurse James Greer at [facility] called to report that [name] is having periods of apnea 20-30 seconds along with some shortness of breath and lethargy. He has had poor p.o. intake today and has edema in both legs with weeping. He has had issues with CHF, dehydration, ARF, cirrhosis, dementia, and bradycardia (not in pacemaker candidate). He was evaluated by Palliative care on 4/21 and has had multiple hospitalizations for irreversible issues. After a careful discussion with James Greer (his son) and daughter in law James Greer we have concluded that comfort care is the best approach. We have decided to avoid hospitalizations and administer morphine for pain or sob. Hospice will be consulted. Orders given to memory care nurse James Greer.

## 2020-09-24 ENCOUNTER — Non-Acute Institutional Stay (SKILLED_NURSING_FACILITY): Payer: Medicare HMO | Admitting: Adult Health

## 2020-09-24 ENCOUNTER — Encounter: Payer: Self-pay | Admitting: Adult Health

## 2020-09-24 DIAGNOSIS — I5033 Acute on chronic diastolic (congestive) heart failure: Secondary | ICD-10-CM

## 2020-09-24 DIAGNOSIS — R001 Bradycardia, unspecified: Secondary | ICD-10-CM

## 2020-09-24 DIAGNOSIS — N1832 Chronic kidney disease, stage 3b: Secondary | ICD-10-CM

## 2020-09-24 DIAGNOSIS — G301 Alzheimer's disease with late onset: Secondary | ICD-10-CM | POA: Diagnosis not present

## 2020-09-24 DIAGNOSIS — F0281 Dementia in other diseases classified elsewhere with behavioral disturbance: Secondary | ICD-10-CM

## 2020-09-24 NOTE — Progress Notes (Signed)
Location:  Medical illustrator of Service:  SNF (31) Provider:   Peggye Greer, ANP [facility] [phone]   James Gammon, MD  Patient Care Team: James Gammon, MD as PCP - General (Internal Medicine) James Rotunda, MD as PCP - Cardiology (Cardiology) Community, [facility] (Skilled Nursing Facility) James Balo, DO (Geriatric Medicine)  Extended Emergency Contact Information Primary Emergency Contact: James Greer Address: [address]          [place], [place] [address] Darden [place] of [place] Home Phone: [phone] Relation: Relative Secondary Emergency Contact: James Greer Address: [address]          [place], [place] [address] D[place] of [place] Home Phone: [phone] Mobile Phone: [phone] Relation: Son  Code Status:  DNR Goals of care: Advanced Directive information Advanced Directives 09/10/2020  Does Patient Have a Medical Advance Directive? Yes  Type of Estate agent of [place];Living will;Out of facility DNR (pink MOST or yellow form)  Does patient want to make changes to medical advance directive? No - Patient declined  Copy of Healthcare Power of Attorney in Chart? Yes - validated most recent copy scanned in chart (See row information)  Would patient like information on creating a medical advance directive? -  Pre-existing out of facility DNR order (yellow form or pink MOST form) Yellow form placed in chart (order not valid for inpatient use)     Chief Complaint  Patient presents with  . Acute Visit    F/u sob and lethargy     HPI:  Pt is a 87 y.o. male seen today for an acute visit for f/u regarding sob and lethargy. Resident resides in memory are due to advanced dementia. He has a complex medical hx which includes dementia, afib, chf, possible cirrhosis, right renal stone with mild obstruction, HLD, falls, DM II, bradycardia (not a pacemaker candidate, BPH,  SDH, and hearing loss. He has had several admission for CHF and dehydration as well as falls with dizziness. On Friday 4/22 I was called as the oncall provider. The nurse reported periods of apnea and lethargy with decreased intake. Later he was also having some shortness of breath. I had a discussion with his son James Greer and daughter in law James Greer and we decided not to send him to the hospital and provide comfort care only. He has received morphine for sob and is comfortable at this time. His pulse is running in the 20-30 range. He is not able to walk anymore but can transfer to the [facility]. He did not eat this weekend but did eat breakfast this morning. His weight was 186 lbs on 4/24 and is now 189 lbs.   Notes from hospitalization 09/06/20-09/09/20: Chest x-ray showed cardiomegaly and pulmonary vascular congestion without overt edema, suspected chronic moderate right layering pleural effusion. Ultrasound right upper quadrant showed markedly thickened and hydropic gallbladder wall suspicious for acute acalculous cholecystitis versus chronic gallbladder wall thickening related to intrinsic liver disease and/or hypoalbuminemia.  No biliary ductal dilatation.  It also showed possibility of hepatic cirrhosis with moderate ascites and moderate right pleural effusion.    Past Medical History:  Diagnosis Date  . Bradycardia 06/24/2020  . Carpal tunnel syndrome   . Chronic atrial fibrillation, unspecified (HCC)   . Chronic kidney disease, stage 3a (HCC)   . Degenerative disease of nervous system (HCC)   . Hyperlipidemia   . Hyperlipidemia associated with type 2 diabetes mellitus (HCC)  02/28/2020  . Localized edema   . Other thrombophilia (HCC)   . Prolonged QT interval 09/06/2020  . Rhinitis, allergic   . Right renal stone 06/07/2020   CT of the pelvis 03/23/20 There appears to be a large, potentially mildly obstructing stone in the distal right ureter at the right UVJ measuring approximately 1.3 cm.  . Toe fracture,  left 05/27/2020   fracture of the proximal phalanx of the left first toe  . Trigger finger   . Type 2 diabetes mellitus (HCC)   . Type 2 diabetes mellitus with diabetic chronic kidney disease (HCC) 02/28/2020  . Unspecified convulsions (HCC)   . Unspecified dementia with behavioral disturbance (HCC)   . Urolithiasis    Past Surgical History:  Procedure Laterality Date  . CRANIOTOMY     with evacuation of subdural hematoma  . EXTRACORPOREAL SHOCK WAVE LITHOTRIPSY      No Known Allergies  Outpatient Encounter Medications as of 09/24/2020  Medication Sig  . apixaban (ELIQUIS) 5 MG TABS tablet Take 1 tablet (5 mg total) by mouth 2 (two) times daily.  . furosemide (LASIX) 20 MG tablet Take 1 tablet (20 mg total) by mouth 2 (two) times daily.  . Glucerna (GLUCERNA) LIQD Take 237 mLs by mouth 2 (two) times daily between meals.  . hydroxypropyl methylcellulose / hypromellose (ISOPTO TEARS / GONIOVISC) 2.5 % ophthalmic solution Place 1 drop into both eyes 3 (three) times daily as needed for dry eyes.  . melatonin 3 MG TABS tablet Take 3 mg by mouth at bedtime.  Marland Kitchen. morphine (ROXANOL) 20 MG/ML concentrated solution Take 0.25 mLs (5 mg total) by mouth every 2 (two) hours as needed for severe pain.  . Multiple Vitamins-Minerals (MULTI-DAY PLUS MINERALS PO) Take 1 tablet by mouth daily. 18mg  Iron-400 mcg-25 mcg  . OVER THE COUNTER MEDICATION Take by mouth See admin instructions. 2 fiber thins with 8 ounces of water,juice or hot tea every morning.  . OXYGEN Inhale 2 L into the lungs as directed. may titrate to 3L as needed to maintain oxygen saturation above 90%  Every Shift; Shift 1, Shift 2, Shift 3  . sodium fluoride (PREVIDENT 5000 PLUS) 1.1 % CREA dental cream Place 1 application onto teeth every evening.  . [DISCONTINUED] morphine (ROXANOL) 20 MG/ML concentrated solution Take by mouth every 2 (two) hours as needed for severe pain or shortness of breath.   No facility-administered encounter  medications on file as of 09/24/2020.    Review of Systems  Unable to perform ROS: Dementia    Immunization History  Administered Date(s) Administered  . Influenza, High Dose Seasonal PF 03/26/2020  . Influenza-Unspecified 03/11/2011, 03/08/2012, 03/02/2013, 08/09/2013, 02/27/2016, 03/02/2017, 01/01/2019, 03/26/2020  . Moderna Sars-Covid-2 Vaccination 07/05/2019, 08/12/2019, 02/04/2020  . Pneumococcal Conjugate-13 06/13/2009  . Zoster 04/02/2010  . Zoster Recombinat (Shingrix) 05/03/2020   Pertinent  Health Maintenance Due  Topic Date Due  . FOOT EXAM  Never done  . URINE MICROALBUMIN  Never done  . PNA vac Low Risk Adult (2 of 2 - PPSV23) 06/13/2010  . INFLUENZA VACCINE  12/31/2020  . HEMOGLOBIN A1C  03/19/2021  . OPHTHALMOLOGY EXAM  04/05/2021   Fall Risk  03/17/2020 03/14/2020  Falls in the past year? 1 1  Number falls in past yr: 1 0  Injury with Fall? 1 1  Risk for fall due to : History of fall(s);Impaired balance/gait;Impaired mobility;Mental status change;Medication side effect -  Follow up Falls evaluation completed;Education provided;Falls prevention discussed;Follow up appointment -  Functional Status Survey:    Vitals:   09/24/20 1013  Pulse: (!) 32  SpO2: 93%  Weight: 189 lb (85.7 kg)   Body mass index is 24.94 kg/m. Physical Exam Vitals and nursing note reviewed.  Constitutional:      General: He is not in acute distress.    Appearance: He is not diaphoretic.  HENT:     Head: Normocephalic and atraumatic.  Neck:     Thyroid: No thyromegaly.     Vascular: No JVD.     Trachea: No tracheal deviation.  Cardiovascular:     Rate and Rhythm: Bradycardia present. Rhythm irregular.     Heart sounds: No murmur heard.   Pulmonary:     Effort: Pulmonary effort is normal. No respiratory distress.     Breath sounds: No wheezing.     Comments: Decreased bases  Abdominal:     General: Bowel sounds are normal. There is no distension.     Palpations:  Abdomen is soft.     Tenderness: There is no abdominal tenderness.  Musculoskeletal:     Comments: BLE +2   Lymphadenopathy:     Cervical: No cervical adenopathy.  Skin:    General: Skin is warm and dry.     Comments: Pale lips ans fingers  Neurological:     General: No focal deficit present.     Mental Status: He is alert.     Cranial Nerves: No cranial nerve deficit.     Comments: Alert recognize family member. MAE but generally weak  Psychiatric:        Mood and Affect: Mood normal.     Labs reviewed: Recent Labs    06/26/20 0956 06/27/20 0550 07/06/20 1304 09/06/20 1323 09/07/20 0839 09/08/20 0810 09/09/20 0607 09/09/20 0823 09/10/20 0000 09/17/20 0000  NA 145 147*   < >  --    < > 139 QUESTIONABLE RESULTS, RECOMMEND RECOLLECT TO VERIFY 140 143 142  K 3.0* 3.6   < >  --    < > 4.2 QUESTIONABLE RESULTS, RECOMMEND RECOLLECT TO VERIFY 3.7 4.1 4.7  CL 101 105   < >  --    < > 101 QUESTIONABLE RESULTS, RECOMMEND RECOLLECT TO VERIFY 103 101 102  CO2 31 30   < >  --    < > 24 QUESTIONABLE RESULTS, RECOMMEND RECOLLECT TO VERIFY 27 25* 25*  GLUCOSE 115* 105*   < >  --    < > 136* QUESTIONABLE RESULTS, RECOMMEND RECOLLECT TO VERIFY 119*  --   --   BUN 40* 37*   < >  --    < > 70* QUESTIONABLE RESULTS, RECOMMEND RECOLLECT TO VERIFY 66* 64* 53*  CREATININE 1.50* 1.29*   < >  --    < > 1.91* QUESTIONABLE RESULTS, RECOMMEND RECOLLECT TO VERIFY 1.87* 1.6* 1.6*  CALCIUM 9.1 9.0   < >  --    < > 9.4 QUESTIONABLE RESULTS, RECOMMEND RECOLLECT TO VERIFY 9.2 9.6 9.8  MG 2.3 2.2  --  2.5*  --   --   --   --   --   --   PHOS 4.1 4.0  --   --   --   --   --   --   --   --    < > = values in this interval not displayed.   Recent Labs    01/23/20 0000 06/24/20 1611 06/24/20 1611 06/26/20 0956 06/27/20 0550 09/06/20 1036  AST 27  42*  --   --   --  52*  ALT 23 35  --   --   --  50*  ALKPHOS  --  77  --   --   --  138*  BILITOT  --  2.0*  --   --   --  1.4*  PROT  --  7.7  --   --   --   7.3  ALBUMIN  --  4.0   < > 3.8 3.3* 3.7   < > = values in this interval not displayed.   Recent Labs    06/24/20 1611 09/05/20 0000 09/06/20 1036 09/07/20 0839 09/08/20 0810 09/09/20 0607 09/09/20 0812  WBC 5.2   < > 7.5   < > 6.2 QUESTIONABLE RESULTS, RECOMMEND RECOLLECT TO VERIFY 5.9  NEUTROABS 3.1  --  5.2  --   --   --  3.8  HGB 14.0   < > 12.9*   < > 13.0 QUESTIONABLE RESULTS, RECOMMEND RECOLLECT TO VERIFY 12.7*  HCT 43.3   < > 39.4   < > 39.9 QUESTIONABLE RESULTS, RECOMMEND RECOLLECT TO VERIFY 39.1  MCV 95.8  --  94.9   < > 95.5 QUESTIONABLE RESULTS, RECOMMEND RECOLLECT TO VERIFY 94.9  PLT 203   < > 168   < > 201 QUESTIONABLE RESULTS, RECOMMEND RECOLLECT TO VERIFY 187   < > = values in this interval not displayed.   Lab Results  Component Value Date   TSH 2.36 01/17/2020   Lab Results  Component Value Date   HGBA1C 6.6 09/17/2020   No results found for: CHOL, HDL, LDLCALC, LDLDIRECT, TRIG, CHOLHDL  Significant Diagnostic Results in last 30 days:  DG Chest Portable 1 View  Result Date: 09/06/2020 CLINICAL DATA:  Weakness, fluid overload EXAM: PORTABLE CHEST 1 VIEW COMPARISON:  Prior chest x-ray 06/27/2020 FINDINGS: Stable cardiomegaly. Pulmonary vascular congestion bordering on mild edema. Chronic elevation of the right hemidiaphragm with associated right middle and lower lobe atelectasis. No new focal infiltrate. Suspect chronic right layering pleural effusion. No pneumothorax. IMPRESSION: 1. Cardiomegaly and pulmonary vascular congestion without overt edema. 2. Suspect chronic moderate right layering pleural effusion. Electronically Signed   By: Malachy Moan M.D.   On: 09/06/2020 11:11   ECHOCARDIOGRAM COMPLETE  Result Date: 09/06/2020    ECHOCARDIOGRAM REPORT   Patient Name:   James Greer Date of Exam: 09/06/2020 Medical Rec #:  [id number]        Height:       73.0 in Accession #:    [id number]       Weight:       182.0 lb Date of Birth:  [birth year]        BSA:           2.067 m Patient Age:    86 years         BP:           135/76 mmHg Patient Gender: M                HR:           38 bpm. Exam Location:  Inpatient Procedure: 2D Echo, Cardiac Doppler and Color Doppler Indications:    Dyspnea  History:        Patient has prior history of Echocardiogram examinations, most                 recent 05/22/2020. Arrythmias:Atrial Fibrillation,  Signs/Symptoms:Dyspnea and LE edema, dementia; Risk                 Factors:Hypertension and Dyslipidemia.  Sonographer:    Lavenia Atlas Referring Phys: [id number] JARED E SEGAL  Sonographer Comments: Image acquisition challenging due to respiratory motion and bradycardia. IMPRESSIONS  1. See previous comments regarding LVH and r/o amyloidosis similar to echo 05/22/20 No strain imaging performed may be useful as well to look for decrease with apical sparing . Left ventricular ejection fraction, by estimation, is 60 to 65%. The left ventricle has normal function. The left ventricle has no regional wall motion abnormalities. There is severe left ventricular hypertrophy. Left ventricular diastolic parameters were normal.  2. Right ventricular systolic function is moderately reduced. The right ventricular size is moderately enlarged. There is normal pulmonary artery systolic pressure.  3. Left atrial size was moderately dilated.  4. Right atrial size was mildly dilated.  5. The mitral valve is normal in structure. Mild mitral valve regurgitation. No evidence of mitral stenosis.  6. The aortic valve is normal in structure. Aortic valve regurgitation is not visualized. No aortic stenosis is present.  7. The inferior vena cava is dilated in size with >50% respiratory variability, suggesting right atrial pressure of 8 mmHg. FINDINGS  Left Ventricle: See previous comments regarding LVH and r/o amyloidosis similar to echo 05/22/20 No strain imaging performed may be useful as well to look for decrease with apical sparing. Left  ventricular ejection fraction, by estimation, is 60 to 65%.  The left ventricle has normal function. The left ventricle has no regional wall motion abnormalities. The left ventricular internal cavity size was normal in size. There is severe left ventricular hypertrophy. Left ventricular diastolic parameters were normal. Right Ventricle: The right ventricular size is moderately enlarged. No increase in right ventricular wall thickness. Right ventricular systolic function is moderately reduced. There is normal pulmonary artery systolic pressure. The tricuspid regurgitant velocity is 2.50 m/s, and with an assumed right atrial pressure of 3 mmHg, the estimated right ventricular systolic pressure is 28.0 mmHg. Left Atrium: Left atrial size was moderately dilated. Right Atrium: Right atrial size was mildly dilated. Pericardium: There is no evidence of pericardial effusion. Mitral Valve: The mitral valve is normal in structure. Mild mitral valve regurgitation. No evidence of mitral valve stenosis. Tricuspid Valve: The tricuspid valve is normal in structure. Tricuspid valve regurgitation is mild . No evidence of tricuspid stenosis. Aortic Valve: The aortic valve is normal in structure. Aortic valve regurgitation is not visualized. No aortic stenosis is present. Pulmonic Valve: The pulmonic valve was normal in structure. Pulmonic valve regurgitation is not visualized. No evidence of pulmonic stenosis. Aorta: The aortic root is normal in size and structure. Venous: The inferior vena cava is dilated in size with greater than 50% respiratory variability, suggesting right atrial pressure of 8 mmHg. IAS/Shunts: No atrial level shunt detected by color flow Doppler.  LEFT VENTRICLE PLAX 2D LVIDd:         3.70 cm  Diastology LVIDs:         2.60 cm  LV e' medial:    4.79 cm/s LV PW:         2.10 cm  LV E/e' medial:  15.9 LV IVS:        1.70 cm  LV e' lateral:   5.44 cm/s LVOT diam:     2.10 cm  LV E/e' lateral: 14.0 LV SV:         46  LV  SV Index:   22 LVOT Area:     3.46 cm  RIGHT VENTRICLE RV Basal diam:  4.20 cm RV S prime:     6.20 cm/s TAPSE (M-mode): 1.5 cm LEFT ATRIUM             Index       RIGHT ATRIUM           Index LA diam:        4.50 cm 2.18 cm/m  RA Area:     20.10 cm LA Vol (A2C):   82.5 ml 39.91 ml/m RA Volume:   59.50 ml  28.78 ml/m LA Vol (A4C):   78.9 ml 38.17 ml/m LA Biplane Vol: 83.7 ml 40.49 ml/m  AORTIC VALVE LVOT Vmax:   74.40 cm/s LVOT Vmean:  55.800 cm/s LVOT VTI:    0.133 m  AORTA Ao Root diam: 3.50 cm MITRAL VALVE               TRICUSPID VALVE MV Area (PHT): 5.66 cm    TR Peak grad:   25.0 mmHg MV Decel Time: 134 msec    TR Vmax:        250.00 cm/s MV E velocity: 76.30 cm/s MV A velocity: 43.30 cm/s  SHUNTS MV E/A ratio:  1.76        Systemic VTI:  0.13 m                            Systemic Diam: 2.10 cm Charlton Haws MD Electronically signed by Charlton Haws MD Signature Date/Time: 09/06/2020/3:23:00 PM    Final    [place] ASCITES (ABDOMEN LIMITED)  Result Date: 09/07/2020 CLINICAL DATA:  87 year old male with concern for ascites. EXAM: LIMITED ABDOMEN ULTRASOUND FOR ASCITES TECHNIQUE: Limited ultrasound survey for ascites was performed in all four abdominal quadrants. COMPARISON:  09/07/2020 FINDINGS: Trace perihepatic ascites. IMPRESSION: Trace perihepatic ascites.  No safe window for paracentesis. Marliss Coots, MD [facility] Electronically Signed   By: Marliss Coots MD   On: 09/07/2020 16:39   US Abdomen Limited RUQ (LIVER/GB)  Result Date: 09/07/2020 CLINICAL DATA:  Abnormal LFTs EXAM: ULTRASOUND ABDOMEN LIMITED RIGHT UPPER QUADRANT COMPARISON:  Prior renal ultrasound 07/18/2018 FINDINGS: Gallbladder: No evidence of cholelithiasis. Markedly thickened and striated gallbladder wall measuring up to 1.3 cm in thickness. Common bile duct: Diameter: Normal at 5 mm Liver: No focal lesion identified. Within normal limits in parenchymal echogenicity.  Moderate volume perihepatic ascites. Subtly nodular contour of the liver. Portal vein is patent on color Doppler imaging with normal direction of blood flow towards the liver. Other: Moderately large right pleural effusion. IMPRESSION: 1. Markedly thickened and hydropic gallbladder wall. Differential considerations include acute acalculous cholecystitis in the appropriate clinical setting versus chronic gallbladder wall thickening related to intrinsic liver disease and/or hypoalbuminemia. 2. Mildly nodular hepatic contour raises the possibility of hepatic cirrhosis. 3. Moderate ascites. 4. Moderate right pleural effusion. 5. No evidence of biliary ductal dilatation. Electronically Signed   By: Malachy Moan M.D.   On: 09/07/2020 06:42    Assessment/Plan 1. Acute on chronic diastolic CHF (congestive heart failure) (HCC) Possible amyloid per cardiology  Appears comfortable at this time with prn morphine.  Nurse will give prn lasix and continue scheduled dosing as well due to weight gain. (pt is not eating well at this time and not sob so I did not change the dose) His goals of care are  comfort based now  2. Bradycardia Not a candidate for pacemaker HR in the 20-30 range. He should only use the [facility] for mobility to avoid falls Hospice consult due to this with periods of apnea   3. Late onset Alzheimer's dementia with behavioral disturbance (HCC) Moderate and progressing over the past year No longer on seroquel and trazodone due to bradycardia (although bradycardia continues due to conduction delay issues) DNR In place   4. Stage 3b chronic kidney disease (HCC) 4/19 BUN 53 Cr 1.6 Avoid frequent intervention due to goals of care  Family/ staff Communication: discussed with the nurse and family 4/22  Labs/tests ordered: NA

## 2020-10-23 ENCOUNTER — Ambulatory Visit: Payer: Medicare HMO | Admitting: Cardiology

## 2020-10-31 DEATH — deceased

## 2022-07-07 IMAGING — CT CT HEAD W/O CM
4 series · 15 of 47 positions shown, 17 images · non-contrast
Comparison: None.

CLINICAL DATA: Facial trauma.

EXAM:
CT HEAD WITHOUT CONTRAST
CT CERVICAL SPINE WITHOUT CONTRAST
TECHNIQUE: Multidetector CT imaging of the head and cervical spine was
performed following the standard protocol without intravenous
contrast. Multiplanar CT image reconstructions of the cervical spine
were also generated.

[Series 3: head wo · axial · 0.43mm/px · z∈[-94,+41]mm · 7 of 37 slices shown, 9 images]
[im 5/37  brain]
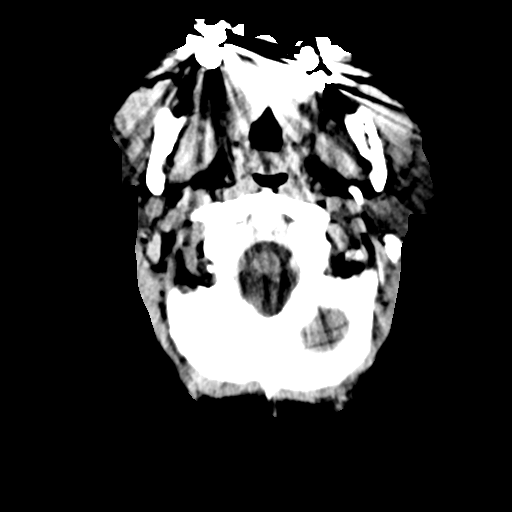
[im 5/37  bone]
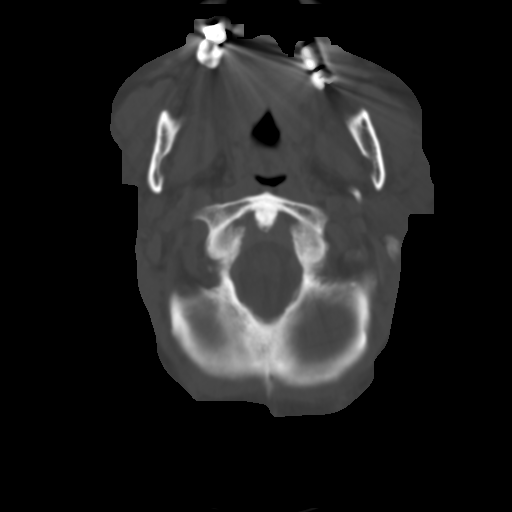
[im 10/37  brain]
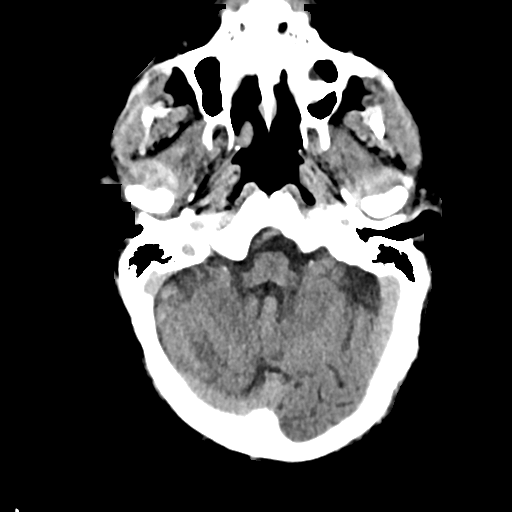
[im 14/37  brain]
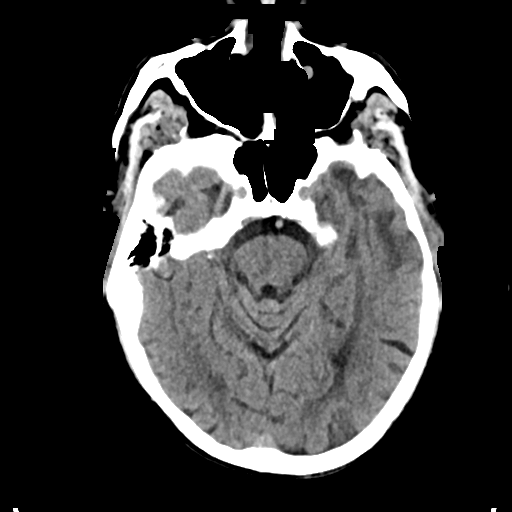
[im 19/37  brain]
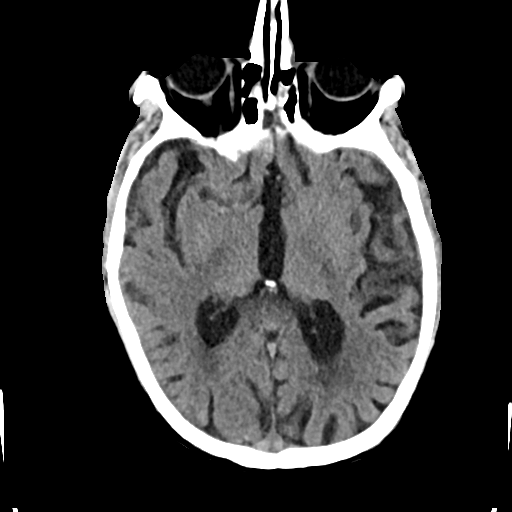
[im 23/37  brain]
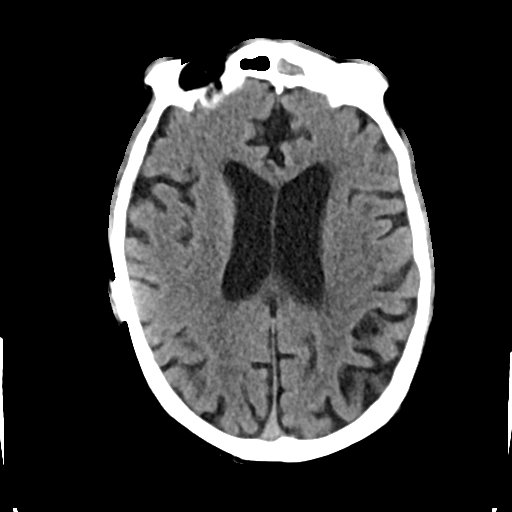
[im 23/37  bone]
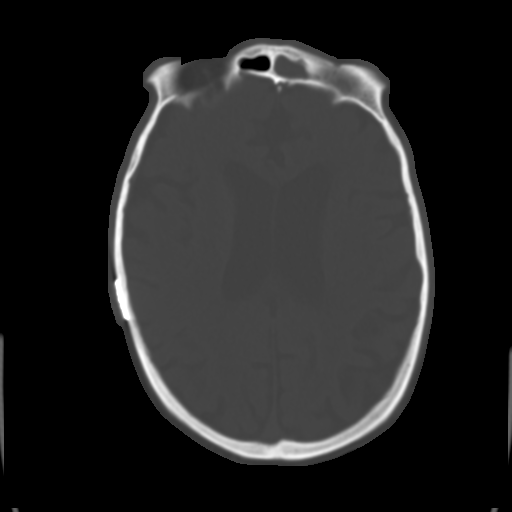
[im 28/37  brain]
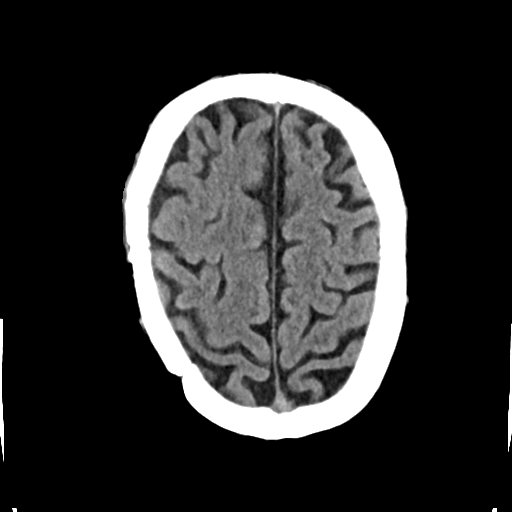
[im 32/37  brain]
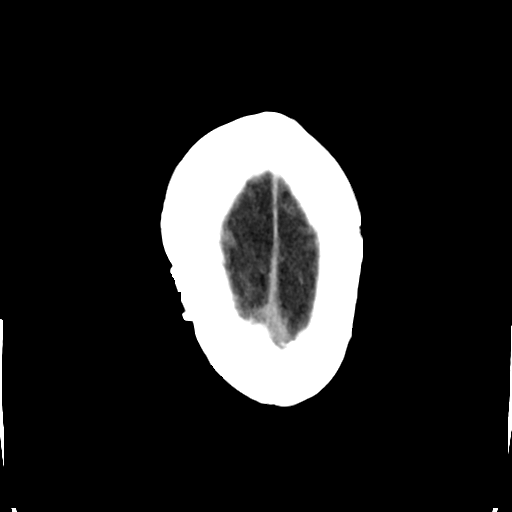

[Series 4: head bone · axial · 0.43mm/px · z∈[-96,-78]mm · 2 of 91 slices shown]
[im 10/91  bone]
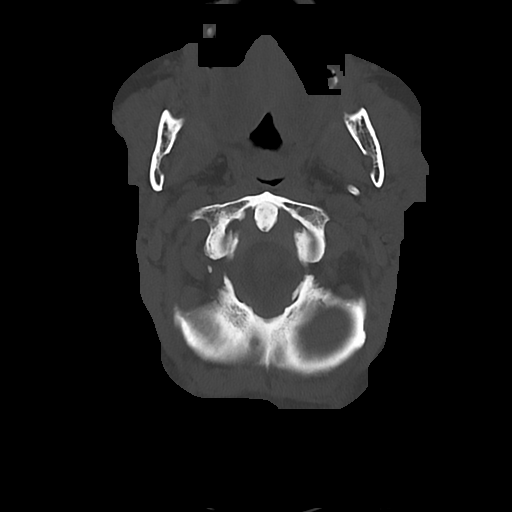
[im 19/91  bone]
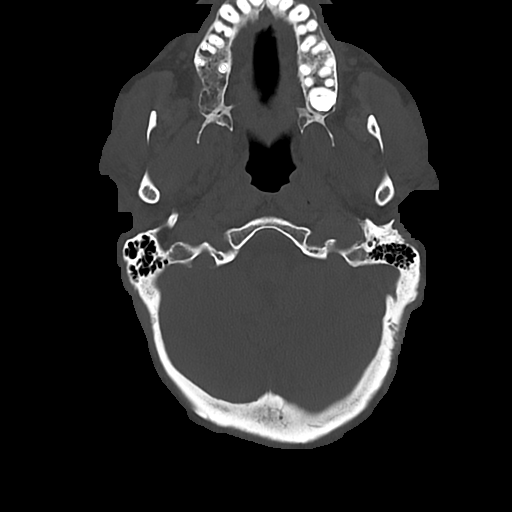

[Series 5: cor soft · coronal · 0.42mm/px · 3 of 76 slices shown]
[im 27/76  brain]
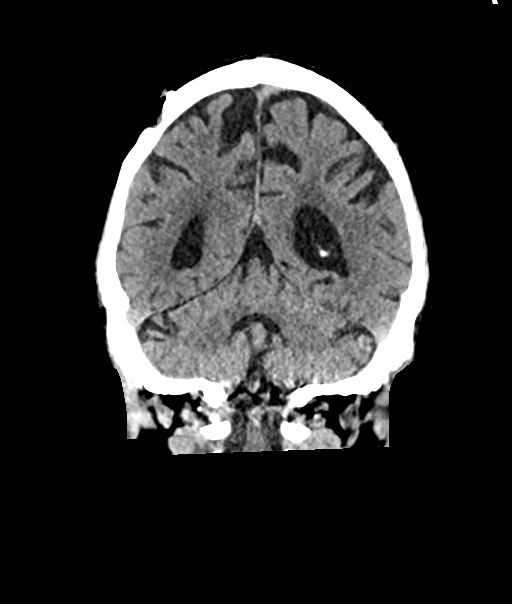
[im 34/76  brain]
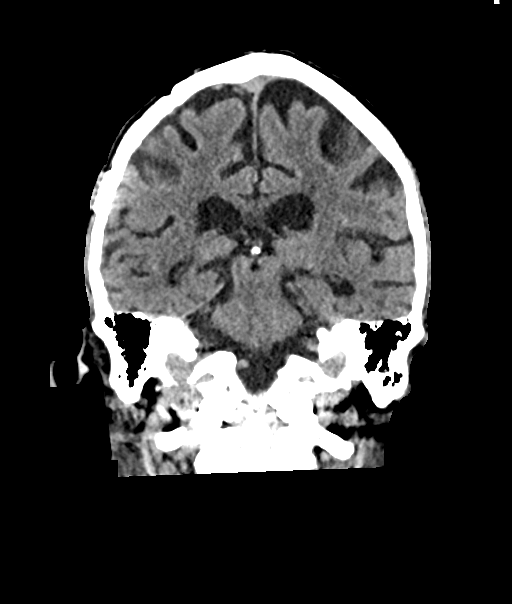
[im 42/76  brain]
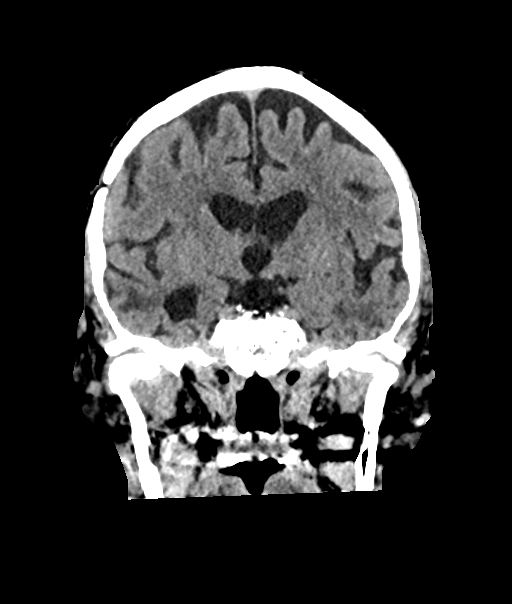

[Series 6: sag soft · sagittal · 0.43mm/px · 3 of 63 slices shown]
[im 21/63  brain]
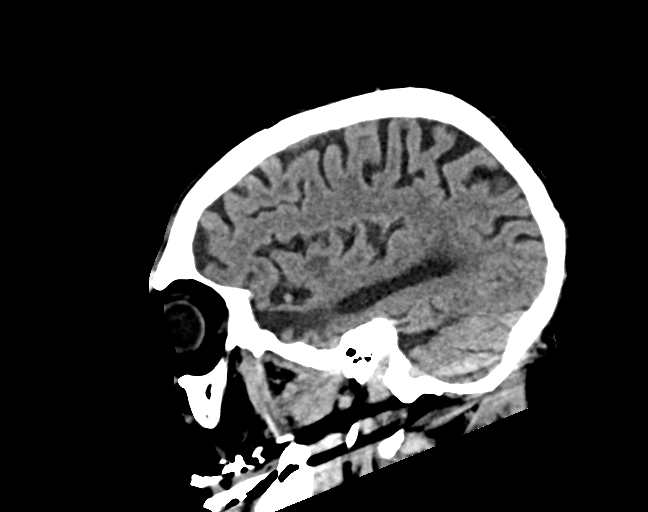
[im 32/63  brain]
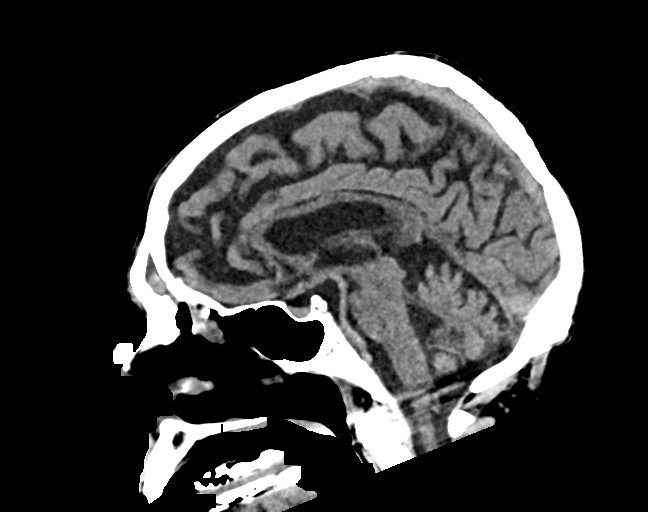
[im 42/63  brain]
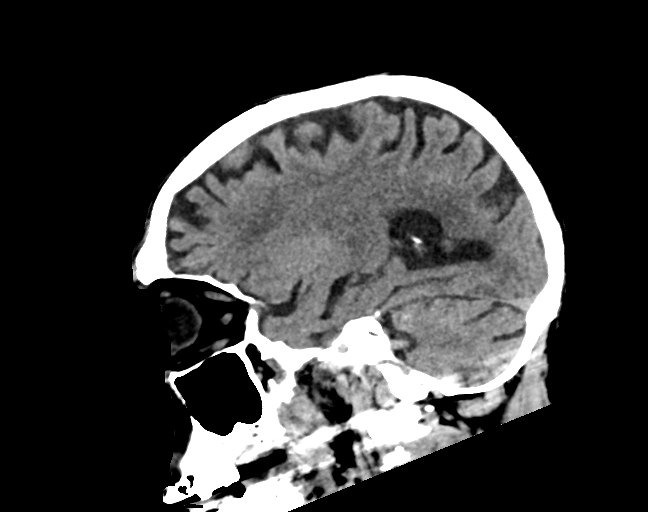

[15 of 47 positions shown; findings below may reference images not displayed]

FINDINGS: CT HEAD FINDINGS

Brain: There is no evidence of an acute infarct, intracranial
hemorrhage, mass, midline shift, or extra-axial fluid collection.
There is encephalomalacia in both temporal lobes. There is
mild-to-moderate cerebral atrophy. Hypodensities in the cerebral
white matter bilaterally are nonspecific but compatible with mild
chronic small vessel ischemic disease.

Vascular: Calcified atherosclerosis at the skull base. No hyperdense
vessel.

Skull: Right parietal craniotomy.  No fracture.

Sinuses/Orbits: Prior functional endoscopic sinus surgery with
complete opacification of the left frontal sinus and scattered
polypoid mucosal thickening in the ethmoid sinuses, left maxillary
sinus, and both nasal cavities suggesting polyps. Clear mastoid air
cells. Bilateral cataract extraction.

Other: None.

CT CERVICAL SPINE FINDINGS

Alignment: Trace retrolisthesis of C4 on C5.

Skull base and vertebrae: No acute fracture or suspicious osseous
lesion.

Soft tissues and spinal canal: No prevertebral fluid or swelling. No
visible canal hematoma.

Disc levels: Moderate disc space narrowing from C3-4 to C5-6.
Moderate multilevel facet arthrosis. Severe bilateral neural
foraminal stenosis at C4-5 and moderate to severe bilateral neural
foraminal stenosis at C5-6 due to uncovertebral spurring.

Upper chest: Partially visualized large right pleural effusion.

Other: Mild-to-moderate calcific atherosclerosis involving the
carotid arteries.
IMPRESSION: 1. No evidence of acute intracranial abnormality.
2. Bilateral temporal lobe encephalomalacia suggesting remote
trauma.
3. Mild chronic small vessel ischemic disease.
4. No acute cervical spine fracture.
5. Partially visualized large right pleural effusion. A chest
radiograph is pending.
